# Patient Record
Sex: Female | Born: 1970
Health system: Southern US, Community
[De-identification: ages and names within clinical notes are randomized; demographics above are authoritative.]

## PROBLEM LIST (undated history)

## (undated) DIAGNOSIS — E669 Obesity, unspecified: Secondary | ICD-10-CM

## (undated) DIAGNOSIS — K219 Gastro-esophageal reflux disease without esophagitis: Secondary | ICD-10-CM

## (undated) DIAGNOSIS — G8929 Other chronic pain: Secondary | ICD-10-CM

## (undated) DIAGNOSIS — D869 Sarcoidosis, unspecified: Secondary | ICD-10-CM

## (undated) DIAGNOSIS — E079 Disorder of thyroid, unspecified: Secondary | ICD-10-CM

## (undated) DIAGNOSIS — M25561 Pain in right knee: Secondary | ICD-10-CM

## (undated) DIAGNOSIS — K8020 Calculus of gallbladder without cholecystitis without obstruction: Secondary | ICD-10-CM

## (undated) DIAGNOSIS — I10 Essential (primary) hypertension: Secondary | ICD-10-CM

## (undated) DIAGNOSIS — M25562 Pain in left knee: Secondary | ICD-10-CM

## (undated) HISTORY — PX: ABDOMINAL HYSTERECTOMY: SHX81

## (undated) HISTORY — PX: OTHER SURGICAL HISTORY: SHX169

## (undated) HISTORY — PX: KNEE SURGERY: SHX244

---

## 2011-05-09 ENCOUNTER — Encounter: Payer: Self-pay | Admitting: Family Medicine

## 2011-05-09 ENCOUNTER — Ambulatory Visit (INDEPENDENT_AMBULATORY_CARE_PROVIDER_SITE_OTHER): Payer: 59 | Admitting: Family Medicine

## 2011-05-09 VITALS — BP 184/92 | HR 87 | Temp 98.1°F | Ht 65.0 in | Wt 268.2 lb

## 2011-05-09 DIAGNOSIS — M25569 Pain in unspecified knee: Secondary | ICD-10-CM

## 2011-05-09 DIAGNOSIS — M25562 Pain in left knee: Secondary | ICD-10-CM

## 2011-05-09 MED ORDER — MELOXICAM 15 MG PO TABS: 15.0000 mg | ORAL_TABLET | Freq: Every day | ORAL | Status: AC

## 2011-05-09 NOTE — Patient Instructions (Signed)
While you do appear to have a baker's cyst deep in your knee, this is rarely a cause of pain (more the result of something else going on in the knee). Take mobic 15mg  daily with food for pain and inflammation. Ice your knee 15 minutes at a time 3-4 times a day. ACE wrap to help put pressure on this and help with swelling. Elevate above the level of your heart as much as possible. Start physical therapy 1-2 times a week for next 3 weeks. See me back in 3 weeks to recheck on how you're doing. We can consider further imaging, injection at that time if you're not improving.

## 2011-05-12 ENCOUNTER — Encounter: Payer: Self-pay | Admitting: Family Medicine

## 2011-05-12 DIAGNOSIS — M179 Osteoarthritis of knee, unspecified: Secondary | ICD-10-CM | POA: Insufficient documentation

## 2011-05-12 DIAGNOSIS — M25562 Pain in left knee: Secondary | ICD-10-CM | POA: Insufficient documentation

## 2011-05-12 NOTE — Assessment & Plan Note (Signed)
Pain, history, and exam not consistent with intraarticular pathology.  Doppler negative for DVT which is reassuring.  Differential includes popliteal spasm, baker's cyst causing stiffness and pain, hamstring/calf tendinopathy.  Did not proceed with x-rays today as they would not change management at this time but if we pursue intraarticular injection (can help with pain from bakers cysts if that is source), would get those at future visit.  Pain seems most likely related to popliteus strain/spasm.  Will treat conservatively - icing, mobic, ace wrap, elevation.  Start PT and will reevaluate in 3 weeks.

## 2011-05-12 NOTE — Progress Notes (Signed)
  Subjective:    Patient ID: Elizabeth Riggs, female    DOB: 06-Jul-1971, 40 y.o.   MRN: [id number]  HPI  40 yo F here for left knee pain  Patient reports no known injury. States began having pain behind left knee about 2 weeks ago. Felt like she had a cramp in her leg behind knee that has not gone away. Hard to put pressure on this leg 2/2 pain here. Has not increased activity level prior to pain. Pain is worse with bending and prolonged standing. No prior left knee injuries or surgeries. Feels stiff. Went to [facility] - had doppler u/s that was negative for DVT (were able to confirm by obtaining records).  Did note there were 2 possible baker's cysts in this knee though. Given prescription for percocet.  History reviewed. No pertinent past medical history.  No current outpatient prescriptions on file prior to visit.    Past Surgical History  Procedure Date  . Right knee surgery     No Known Allergies  History   Social History  . Marital Status: Single    Spouse Name: N/A    Number of Children: N/A  . Years of Education: N/A   Occupational History  . Not on file.   Social History Main Topics  . Smoking status: Never Smoker   . Smokeless tobacco: Not on file  . Alcohol Use: Not on file  . Drug Use: Not on file  . Sexually Active: Not on file   Other Topics Concern  . Not on file   Social History Narrative  . No narrative on file    Family History  Problem Relation Age of Onset  . Hypertension Mother   . Diabetes Father   . Hypertension Father   . Heart attack Neg Hx     BP 184/92  Pulse 87  Temp(Src) 98.1 F (36.7 C) (Oral)  Ht 5\' 5"  (1.651 m)  Wt 268 lb 3.2 oz (121.655 kg)  BMI 44.63 kg/m2  Review of Systems See HPI above.    Objective:   Physical Exam Gen: NAD L knee: No gross deformity, effusion, bruising. No joint line or posterior patellar TTP.  No pes TTP.  No palpable cords in calf, thigh, or behind knee.  No swelling,  erythema or warmth here either.  Pain with very deep palpation popliteal fossa.  No TTP hamstring or calf tendons. FROM. Stable to valgus/varus stress.  Negative ant/post drawers.  Negative lachmanns Negative mcmurrays, apprehension, clarkes NVI distally.    MSK u/s: with 4 cm depth on ultrasound, able to see two hypoechoic areas very deep on this posterior left knee.  Deep to vasculature.  Assessment & Plan:  1. Left knee pain - Pain, history, and exam not consistent with intraarticular pathology.  Doppler negative for DVT which is reassuring.  Differential includes popliteal spasm, baker's cyst causing stiffness and pain, hamstring/calf tendinopathy.  Did not proceed with x-rays today as they would not change management at this time but if we pursue intraarticular injection (can help with pain from bakers cysts if that is source), would get those at future visit.  Pain seems most likely related to popliteus strain/spasm.  Will treat conservatively - icing, mobic, ace wrap, elevation.  Start PT and will reevaluate in 3 weeks.

## 2011-05-16 ENCOUNTER — Ambulatory Visit: Payer: 59 | Attending: Family Medicine | Admitting: Physical Therapy

## 2011-05-16 DIAGNOSIS — M25669 Stiffness of unspecified knee, not elsewhere classified: Secondary | ICD-10-CM | POA: Insufficient documentation

## 2011-05-16 DIAGNOSIS — IMO0001 Reserved for inherently not codable concepts without codable children: Secondary | ICD-10-CM | POA: Insufficient documentation

## 2011-05-16 DIAGNOSIS — M25569 Pain in unspecified knee: Secondary | ICD-10-CM | POA: Insufficient documentation

## 2011-05-18 ENCOUNTER — Ambulatory Visit: Payer: 59 | Admitting: Physical Therapy

## 2011-05-20 ENCOUNTER — Encounter: Payer: Self-pay | Admitting: Family Medicine

## 2011-05-25 ENCOUNTER — Ambulatory Visit: Payer: 59 | Admitting: Rehabilitation

## 2011-05-30 ENCOUNTER — Encounter: Payer: Self-pay | Admitting: Family Medicine

## 2011-05-30 ENCOUNTER — Ambulatory Visit (HOSPITAL_BASED_OUTPATIENT_CLINIC_OR_DEPARTMENT_OTHER)
Admission: RE | Admit: 2011-05-30 | Discharge: 2011-05-30 | Disposition: A | Discharge status: Home / Self Care | Payer: 59 | Source: Ambulatory Visit | Attending: Family Medicine | Admitting: Family Medicine

## 2011-05-30 ENCOUNTER — Ambulatory Visit (INDEPENDENT_AMBULATORY_CARE_PROVIDER_SITE_OTHER): Payer: 59 | Admitting: Family Medicine

## 2011-05-30 VITALS — BP 162/97 | HR 90 | Temp 97.6°F | Ht 65.0 in | Wt 250.0 lb

## 2011-05-30 DIAGNOSIS — R609 Edema, unspecified: Secondary | ICD-10-CM | POA: Insufficient documentation

## 2011-05-30 DIAGNOSIS — M25562 Pain in left knee: Secondary | ICD-10-CM

## 2011-05-30 DIAGNOSIS — M25569 Pain in unspecified knee: Secondary | ICD-10-CM

## 2011-05-30 DIAGNOSIS — M1710 Unilateral primary osteoarthritis, unspecified knee: Secondary | ICD-10-CM | POA: Insufficient documentation

## 2011-05-30 DIAGNOSIS — IMO0002 Reserved for concepts with insufficient information to code with codable children: Secondary | ICD-10-CM | POA: Insufficient documentation

## 2011-05-30 NOTE — Progress Notes (Signed)
Subjective:    Patient ID: Elizabeth Riggs, female    DOB: 12/11/1971, 40 y.o.   MRN: [id number]  HPI   40 yo F here for 3 week f/u left knee pain  Last OV: Patient reports no known injury. States began having pain behind left knee about 2 weeks ago. Felt like she had a cramp in her leg behind knee that has not gone away. Hard to put pressure on this leg 2/2 pain here. Has not increased activity level prior to pain. Pain is worse with bending and prolonged standing. No prior left knee injuries or surgeries. Feels stiff. Went to [facility] ED - had doppler u/s that was negative for DVT (were able to confirm by obtaining records).  Did note there were 2 possible baker's cysts in this knee though. Given prescription for percocet.  Today: Patient has been going to PT for popliteal strain/spasm and states helps some while there but massage hurts a lot when they do this Mobic helps only for about an hour Knee swelling and now having medial left knee pain as well. Going to start 12 hour shifts at work - pain worse with prolonged standing, walking Walking with a limp. Still has stiffness within knee as well. No true catching or locking.  History reviewed. No pertinent past medical history.  Current Outpatient Prescriptions on File Prior to Visit  Medication Sig Dispense Refill  . meloxicam (MOBIC) 15 MG tablet Take 1 tablet (15 mg total) by mouth daily. With food.  30 tablet  1    Past Surgical History  Procedure Date  . Right knee surgery     No Known Allergies  History   Social History  . Marital Status: Single    Spouse Name: N/A    Number of Children: N/A  . Years of Education: N/A   Occupational History  . Not on file.   Social History Main Topics  . Smoking status: Never Smoker   . Smokeless tobacco: Not on file  . Alcohol Use: Not on file  . Drug Use: Not on file  . Sexually Active: Not on file   Other Topics Concern  . Not on file   Social  History Narrative  . No narrative on file    Family History  Problem Relation Age of Onset  . Hypertension Mother   . Diabetes Father   . Hypertension Father   . Heart attack Neg Hx     BP 162/97  Pulse 90  Temp(Src) 97.6 F (36.4 C) (Oral)  Ht 5\' 5"  (1.651 m)  Wt 250 lb (113.399 kg)  BMI 41.60 kg/m2  Review of Systems  See HPI above.    Objective:   Physical Exam  Gen: NAD L knee: No gross deformity, effusion, bruising. Mild-mod medial joint line TTP.  No lateral TTP.  TTP deep in popliteal fossa.  No pes TTP.  No palpable cords in calf, thigh, or behind knee.  No swelling, erythema or warmth here either.  No TTP hamstring or calf tendons. FROM. Stable to valgus/varus stress.  Negative ant/post drawers.  Negative lachmanns Negative mcmurrays, apprehension, clarkes NVI distally.     Assessment & Plan:  1. Left knee pain - X-rays performed and reviewed - has moderate medial DJD, advanced for her age.  Tenderness in this area, lack of an injury, baker's cysts and intermittent swelling all consistent with this diagnosis.  Will continue with PT and given cortisone injection today.  Advised her to f/u with  me in 2 weeks.  If not improving still, would then get MRI to rule out meniscal tear or other pathology.  Continue icing, mobic, elevation.  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  After informed written consent, patient was seated on exam table and left knee was prepped with alcohol swab.  Utilizing anterolateral approach, left knee was injected intraarticularly with 3:1 marcaine: depomedrol.  Patient tolerated procedure well without any immediate complications.

## 2011-05-30 NOTE — Assessment & Plan Note (Signed)
X-rays performed and reviewed - has moderate medial DJD, advanced for her age.  Tenderness in this area, lack of an injury, baker's cysts and intermittent swelling all consistent with this diagnosis.  Will continue with PT and given cortisone injection today.  Advised her to f/u with me in 2 weeks.  If not improving still, would then get MRI to rule out meniscal tear or other pathology.  Continue icing, mobic, elevation.  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  After informed written consent, patient was seated on exam table and left knee was prepped with alcohol swab.  Utilizing anterolateral approach, left knee was injected intraarticularly with 3:1 marcaine: depomedrol.  Patient tolerated procedure well without any immediate complications.

## 2011-05-31 ENCOUNTER — Encounter: Payer: 59 | Admitting: Physical Therapy

## 2011-06-02 ENCOUNTER — Ambulatory Visit: Payer: 59 | Admitting: Physical Therapy

## 2011-06-13 ENCOUNTER — Encounter: Payer: Self-pay | Admitting: Family Medicine

## 2011-06-13 ENCOUNTER — Ambulatory Visit (INDEPENDENT_AMBULATORY_CARE_PROVIDER_SITE_OTHER): Payer: 59 | Admitting: Family Medicine

## 2011-06-13 VITALS — BP 127/85 | HR 80 | Temp 97.4°F | Ht 65.0 in | Wt 235.0 lb

## 2011-06-13 DIAGNOSIS — M25569 Pain in unspecified knee: Secondary | ICD-10-CM

## 2011-06-13 DIAGNOSIS — M25562 Pain in left knee: Secondary | ICD-10-CM

## 2011-06-13 NOTE — Progress Notes (Signed)
Subjective:    Patient ID: Elizabeth Riggs, female    DOB: November 18, 1971, 40 y.o.   MRN: [id number]  HPI   40 yo F here for 40 week f/u left knee pain  Initial OV: Patient reports no known injury. States began having pain behind left knee about 2 weeks ago. Felt like she had a cramp in her leg behind knee that has not gone away. Hard to put pressure on this leg 2/2 pain here. Has not increased activity level prior to pain. Pain is worse with bending and prolonged standing. No prior left knee injuries or surgeries. Feels stiff. Went to [facility] ED - had doppler u/s that was negative for DVT (were able to confirm by obtaining records).  Did note there were 2 possible baker's cysts in this knee though. Given prescription for percocet.  2 weeks ago: Patient has been going to PT for popliteal strain/spasm and states helps some while there but massage hurts a lot when they do this Mobic helps only for about an hour Knee swelling and now having medial left knee pain as well. Going to start 12 hour shifts at work - pain worse with prolonged standing, walking Walking with a limp. Still has stiffness within knee as well. No true catching or locking.  Today: Patient is significantly improved after cortisone injection, no pain currently. 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 No longer icing or taking mobic. Finished with PT. Ready to go back to work full time.  History reviewed. No pertinent past medical history.  Current Outpatient Prescriptions on File Prior to Visit  Medication Sig Dispense Refill  . meloxicam (MOBIC) 15 MG tablet Take 1 tablet (15 mg total) by mouth daily. With food.  30 tablet  1    Past Surgical History  Procedure Date  . Right knee surgery     No Known Allergies  History   Social History  . Marital Status: Single    Spouse Name: N/A    Number of Children: N/A  . Years of  Education: N/A   Occupational History  . Not on file.   Social History Main Topics  . Smoking status: Never Smoker   . Smokeless tobacco: Not on file  . Alcohol Use: Not on file  . Drug Use: Not on file  . Sexually Active: Not on file   Other Topics Concern  . Not on file   Social History Narrative  . No narrative on file    Family History  Problem Relation Age of Onset  . Hypertension Mother   . Diabetes Father   . Hypertension Father   . Heart attack Neg Hx     BP 127/85  Pulse 80  Temp(Src) 97.4 F (36.3 C) (Oral)  Ht 5\' 5"  (1.651 m)  Wt 235 lb (106.595 kg)  BMI 39.11 kg/m2  Review of Systems  See HPI above.    Objective:   Physical Exam  Gen: NAD L knee: No gross deformity, effusion, bruising. No joint line TTP.  No lateral TTP.  No posterior TTP.  No pes TTP.  No palpable cords in calf, thigh, or behind knee.  No swelling, erythema or warmth here either.  No TTP hamstring or calf tendons. FROM. Stable to valgus/varus stress.  Negative ant/post drawers.  Negative lachmanns Negative mcmurrays, apprehension, clarkes NVI distally.     Assessment & Plan:  1. Left knee pain - 2/2 DJD.  Significant improvement with cortisone injection.  Discussed can get these every 3 months if needed.  Ice, aleve OR mobic as needed.  Return to work full duty.  Continue home exercises.  F/u prn.

## 2011-06-13 NOTE — Assessment & Plan Note (Signed)
2/2 DJD.  Significant improvement with cortisone injection.  Discussed can get these every 3 months if needed.  Ice, aleve OR mobic as needed.  Return to work full duty.  Continue home exercises.  F/u prn.

## 2011-07-11 ENCOUNTER — Ambulatory Visit: Payer: 59 | Admitting: Family Medicine

## 2011-07-14 ENCOUNTER — Ambulatory Visit: Payer: 59 | Admitting: Family Medicine

## 2011-07-28 ENCOUNTER — Ambulatory Visit (INDEPENDENT_AMBULATORY_CARE_PROVIDER_SITE_OTHER): Payer: 59 | Admitting: Family Medicine

## 2011-07-28 ENCOUNTER — Encounter: Payer: Self-pay | Admitting: Family Medicine

## 2011-07-28 VITALS — BP 137/92 | HR 85 | Temp 98.3°F | Ht 65.0 in | Wt 240.0 lb

## 2011-07-28 DIAGNOSIS — M25569 Pain in unspecified knee: Secondary | ICD-10-CM

## 2011-07-28 DIAGNOSIS — M25562 Pain in left knee: Secondary | ICD-10-CM

## 2011-07-28 NOTE — Assessment & Plan Note (Signed)
2/2 DJD, less likely degen meniscal tear.  Significant improvement with prior cortisone injection but pain has recurred.  Given another cortisone injection today.  Advised if this one does not help with her pain more than 3 months, I would urge her to get MRI of this knee.  She does have moderate medial DJD however and this is most likely the cause of her pain.  Continue icing, add nsaids as needed.  She has already completed a course of PT/home exercises.  F/u prn.  Timeout performed.  After informed written consent, patient was seated on exam table. Left knee was prepped with alcohol swab and utilizing anterolateral approach, patient's left knee was injected intraarticularly with 3:1 marcaine: depomedrol. Patient tolerated the procedure well without immediate complications.

## 2011-07-28 NOTE — Progress Notes (Signed)
Subjective:    Patient ID: Elizabeth Riggs, female    DOB: 19-Aug-1971, 40 y.o.   MRN: [id number]  HPI  40 yo F here for f/u left knee pain  5/14: Patient reports no known injury. States began having pain behind left knee about 2 weeks ago. Felt like she had a cramp in her leg behind knee that has not gone away. Hard to put pressure on this leg 2/2 pain here. Has not increased activity level prior to pain. Pain is worse with bending and prolonged standing. No prior left knee injuries or surgeries. Feels stiff. Went to [facility] ED - had doppler u/s that was negative for DVT (were able to confirm by obtaining records).  Did note there were 2 possible baker's cysts in this knee though. Given prescription for percocet.  6/4: Patient has been going to PT for popliteal strain/spasm and states helps some while there but massage hurts a lot when they do this Mobic helps only for about an hour Knee swelling and now having medial left knee pain as well. Going to start 12 hour shifts at work - pain worse with prolonged standing, walking Walking with a limp. Still has stiffness within knee as well. No true catching or locking.  6/18: Patient is significantly improved after cortisone injection, no pain currently. 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 No longer icing or taking mobic. Finished with PT. Ready to go back to work full time.  8/2: Patient did extremely well until about 2 weeks ago when pain recurred. Pain up to an 8/10 and worse when sitting for prolonged period then goes to get up. Pain anterior and medial with associated swelling. No mechanical symptoms. Is icing Not taking any aleve, mobic, or other medicines  History reviewed. No pertinent past medical history.  Current Outpatient Prescriptions on File Prior to Visit  Medication Sig Dispense Refill  . meloxicam (MOBIC) 15 MG tablet Take 1  tablet (15 mg total) by mouth daily. With food.  30 tablet  1    Past Surgical History  Procedure Date  . Right knee surgery     No Known Allergies  History   Social History  . Marital Status: Single    Spouse Name: N/A    Number of Children: N/A  . Years of Education: N/A   Occupational History  . Not on file.   Social History Main Topics  . Smoking status: Never Smoker   . Smokeless tobacco: Not on file  . Alcohol Use: Not on file  . Drug Use: Not on file  . Sexually Active: Not on file   Other Topics Concern  . Not on file   Social History Narrative  . No narrative on file    Family History  Problem Relation Age of Onset  . Hypertension Mother   . Diabetes Father   . Hypertension Father   . Heart attack Neg Hx     BP 137/92  Pulse 85  Temp(Src) 98.3 F (36.8 C) (Oral)  Ht 5\' 5"  (1.651 m)  Wt 240 lb (108.863 kg)  BMI 39.94 kg/m2  Review of Systems  See HPI above.    Objective:   Physical Exam  Gen: NAD L knee: No gross deformity, effusion, bruising. Mod medial joint line TTP.  No lateral joint line, patellar tendon, post pat facet, pes, or other TTP about knee. FROM.  Stable to valgus/varus stress.  Negative ant/post drawers.  Negative lachmanns Negative mcmurrays, apprehension, clarkes NVI distally.     Assessment & Plan:  1. Left knee pain - 2/2 DJD, less likely degen meniscal tear.  Significant improvement with prior cortisone injection but pain has recurred.  Given another cortisone injection today.  Advised if this one does not help with her pain more than 3 months, I would urge her to get MRI of this knee.  She does have moderate medial DJD however and this is most likely the cause of her pain.  Continue icing, add nsaids as needed.  She has already completed a course of PT/home exercises.  F/u prn.  Timeout performed.  After informed written consent, patient was seated on exam table. Left knee was prepped with alcohol swab and utilizing  anterolateral approach, patient's left knee was injected intraarticularly with 3:1 marcaine: depomedrol. Patient tolerated the procedure well without immediate complications.

## 2011-08-26 ENCOUNTER — Ambulatory Visit: Payer: 59 | Admitting: Family Medicine

## 2011-09-23 ENCOUNTER — Ambulatory Visit: Payer: 59 | Admitting: Family Medicine

## 2011-09-26 ENCOUNTER — Ambulatory Visit (INDEPENDENT_AMBULATORY_CARE_PROVIDER_SITE_OTHER): Payer: 59 | Admitting: Family Medicine

## 2011-09-26 ENCOUNTER — Encounter: Payer: Self-pay | Admitting: Family Medicine

## 2011-09-26 VITALS — BP 146/92 | HR 80 | Temp 97.5°F | Ht 65.0 in | Wt 250.0 lb

## 2011-09-26 DIAGNOSIS — M25569 Pain in unspecified knee: Secondary | ICD-10-CM

## 2011-09-26 DIAGNOSIS — M25562 Pain in left knee: Secondary | ICD-10-CM

## 2011-09-26 NOTE — Patient Instructions (Signed)
The next step will be to go ahead with an MRI of your knee to assess for a medial meniscal tear since you aren't getting better as we would expect up to this point. I will call you with the test results after I receive them and how we are to proceed (the gel shot vs referral to consider having your knee scoped if you do have a meniscus tear).

## 2011-09-27 ENCOUNTER — Ambulatory Visit (HOSPITAL_BASED_OUTPATIENT_CLINIC_OR_DEPARTMENT_OTHER)
Admission: RE | Admit: 2011-09-27 | Discharge: 2011-09-27 | Disposition: A | Discharge status: Home / Self Care | Payer: 59 | Source: Ambulatory Visit | Attending: Family Medicine | Admitting: Family Medicine

## 2011-09-27 ENCOUNTER — Encounter: Payer: Self-pay | Admitting: Family Medicine

## 2011-09-27 ENCOUNTER — Other Ambulatory Visit (HOSPITAL_BASED_OUTPATIENT_CLINIC_OR_DEPARTMENT_OTHER): Payer: 59

## 2011-09-27 DIAGNOSIS — M25562 Pain in left knee: Secondary | ICD-10-CM

## 2011-09-27 DIAGNOSIS — M25469 Effusion, unspecified knee: Secondary | ICD-10-CM | POA: Insufficient documentation

## 2011-09-27 DIAGNOSIS — M25569 Pain in unspecified knee: Secondary | ICD-10-CM | POA: Insufficient documentation

## 2011-09-27 DIAGNOSIS — M23329 Other meniscus derangements, posterior horn of medial meniscus, unspecified knee: Secondary | ICD-10-CM | POA: Insufficient documentation

## 2011-09-27 DIAGNOSIS — M948X9 Other specified disorders of cartilage, unspecified sites: Secondary | ICD-10-CM

## 2011-09-27 NOTE — Progress Notes (Addendum)
Subjective:    Patient ID: Elizabeth Riggs, female    DOB: 03-25-1971, 40 y.o.   MRN: [id number]  Leg Pain    40 yo F here for f/u left knee pain  5/14: Patient reports no known injury. States began having pain behind left knee about 2 weeks ago. Felt like she had a cramp in her leg behind knee that has not gone away. Hard to put pressure on this leg 2/2 pain here. Has not increased activity level prior to pain. Pain is worse with bending and prolonged standing. No prior left knee injuries or surgeries. Feels stiff. Went to [facility] ED - had doppler u/s that was negative for DVT (were able to confirm by obtaining records).  Did note there were 2 possible baker's cysts in this knee though. Given prescription for percocet.  6/4: Patient has been going to PT for popliteal strain/spasm and states helps some while there but massage hurts a lot when they do this Mobic helps only for about an hour Knee swelling and now having medial left knee pain as well. Going to start 12 hour shifts at work - pain worse with prolonged standing, walking Walking with a limp. Still has stiffness within knee as well. No true catching or locking.  6/18: Patient is significantly improved after cortisone injection, no pain currently. 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 No longer icing or taking mobic. Finished with PT. Ready to go back to work full time.  8/2: Patient did extremely well until about 2 weeks ago when pain recurred. Pain up to an 8/10 and worse when sitting for prolonged period then goes to get up. Pain anterior and medial with associated swelling. No mechanical symptoms. Is icing Not taking any aleve, mobic, or other medicines  10/1: Patient reports last injection only helped for about a week. Pain is about the same - anteromedial with swelling. No catching, locking. Feels unstable at times. Taking  ibuprofen - has tried aleve, mobic in past. Had two cortisone injections (first lasted for about 2 months). Also did PT.  History reviewed. No pertinent past medical history.  Current Outpatient Prescriptions on File Prior to Visit  Medication Sig Dispense Refill  . meloxicam (MOBIC) 15 MG tablet Take 1 tablet (15 mg total) by mouth daily. With food.  30 tablet  1    Past Surgical History  Procedure Date  . Right knee surgery     Allergies  Allergen Reactions  . Percocet (Oxycodone-Acetaminophen)     History   Social History  . Marital Status: Single    Spouse Name: N/A    Number of Children: N/A  . Years of Education: N/A   Occupational History  . Not on file.   Social History Main Topics  . Smoking status: Never Smoker   . Smokeless tobacco: Not on file  . Alcohol Use: Not on file  . Drug Use: Not on file  . Sexually Active: Not on file   Other Topics Concern  . Not on file   Social History Narrative  . No narrative on file    Family History  Problem Relation Age of Onset  . Hypertension Mother   . Diabetes Father   . Hypertension Father   . Heart attack Neg Hx     BP 146/92  Pulse 80  Temp(Src) 97.5 F (36.4 C) (Oral)  Ht 5'  5" (1.651 m)  Wt 250 lb (113.399 kg)  BMI 41.60 kg/m2  Review of Systems  See HPI above.    Objective:   Physical Exam  Gen: NAD L knee: No gross deformity, effusion, bruising. Mod medial joint line TTP.  No lateral joint line, patellar tendon, post pat facet, pes, or other TTP about knee. FROM. Stable to valgus/varus stress.  Negative ant/post drawers.  Negative lachmanns Pain medially with mcmurrays.  Negative apprehension, clarkes NVI distally.     Assessment & Plan:  1. Left knee pain - 2/2 DJD vs degen medial meniscal tear.  Would expect longer lasting improvement with cortisone injections if this was simply DJD.  Will move forward with MRI of knee to further assess for possible meniscal tear.  Consider  referral for arthroscopy vs viscosupplementation depending on those results.  Continue icing, ibuprofen.  Continue home exercises.    Addendum:  Patient's MRI reviewed and discussed with patient.  She does have a medial meniscal tear but also DJD in medial compartment.  She has not improved as much as expected with conservative care if this was only DJD.  Will refer to orthopedic surgeon for consideration of knee arthroscopy but stressed cautious optimism if they go ahead with arthroscopy because it's not possible at this point to completely discern to what extent her pain is from DJD vs the meniscal tear.

## 2011-09-27 NOTE — Assessment & Plan Note (Signed)
2/2 DJD vs degen medial meniscal tear.  Would expect longer lasting improvement with cortisone injections if this was simply DJD.  Will move forward with MRI of knee to further assess for possible meniscal tear.  Consider referral for arthroscopy vs viscosupplementation depending on those results.  Continue icing, ibuprofen.  Continue home exercises.

## 2011-09-28 NOTE — Progress Notes (Signed)
Addended by: Lenda Kelp on: 09/28/2011 09:11 AM   Modules accepted: Orders

## 2011-10-14 ENCOUNTER — Ambulatory Visit (HOSPITAL_BASED_OUTPATIENT_CLINIC_OR_DEPARTMENT_OTHER)
Admission: RE | Admit: 2011-10-14 | Discharge: 2011-10-14 | Disposition: A | Discharge status: Home / Self Care | Payer: 59 | Source: Ambulatory Visit | Attending: Orthopedic Surgery | Admitting: Orthopedic Surgery

## 2011-10-14 DIAGNOSIS — M23329 Other meniscus derangements, posterior horn of medial meniscus, unspecified knee: Secondary | ICD-10-CM | POA: Insufficient documentation

## 2011-10-14 DIAGNOSIS — Z01812 Encounter for preprocedural laboratory examination: Secondary | ICD-10-CM | POA: Insufficient documentation

## 2011-10-14 LAB — POCT HEMOGLOBIN-HEMACUE: Hemoglobin: 12.6 g/dL (ref 12.0–15.0)

## 2011-10-17 NOTE — Op Note (Signed)
  NAMEWINDA, Elizabeth NO.:  [id number]  MEDICAL RECORD NO.:  [id number]  LOCATION:                                 FACILITY:  PHYSICIAN:  Eulas Post, MD    DATE OF BIRTH:  02-20-1971  DATE OF PROCEDURE:  10/14/2011 DATE OF DISCHARGE:                              OPERATIVE REPORT   ATTENDING SURGEON:  Eulas Post, MD  PREOPERATIVE DIAGNOSIS:  Right knee posterior horn medial meniscus tear.  POSTOPERATIVE DIAGNOSE:  Right knee posterior horn medial meniscus tear.  OPERATIVE PROCEDURE:  Right knee arthroscopy with partial medial meniscectomy.  ANESTHESIA:  General.  ESTIMATED BLOOD LOSS:  Minimal.  OPERATIVE FINDINGS:  There was some slight cracking on the undersurface of the patella.  The patellofemoral joint was otherwise normal.  The medial and lateral gutters, lateral compartment, ACL and PCL were all normal.  The medial compartment had grade 1 chondral changes.  The medial meniscus had a small radial tear in the posterior horn.  The rest of the meniscus was intact.  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  She elected for surgical management.  The risks, benefits, and alternatives were discussed before the procedure including but not limited to risks of infection, bleeding, nerve injury, recurrent meniscal tear, incomplete relief of pain, stiffness, loss of function, cardiopulmonary complications, among others and she is willing to proceed.  OPERATIVE PROCEDURE:  The patient was brought to the operating room and placed in supine position.  General anesthesia was administered.  The right lower extremity is prepped and draped in usual sterile fashion. Time-out was performed.  Diagnostic arthroscopy was carried out with the above-named findings.  I used 2 inferior portals.  The arthroscopic shaver and the arthroscopic biter was used to debride back the  posterior horn of the medial meniscus.  This was debrided back to a stable rim. Access to the very posterior aspect of this was somewhat challenging secondary to her body habitus, but we were able to get access and to debride the tear back.  Stable configuration was achieved.  I irrigated the knee copiously and repaired the portals with Monocryl followed by Steri-Strips with sterile gauze. She was awakened and returned to the PACU in stable and satisfactory condition.  There no complications.  She tolerated procedure well.     Eulas Post, MD     JPL/MEDQ  D:  10/14/2011  T:  10/14/2011  Job:  [id number]  Electronically Signed by Teryl Lucy MD on 10/17/2011 04:22:29 PM

## 2012-10-08 ENCOUNTER — Encounter (HOSPITAL_BASED_OUTPATIENT_CLINIC_OR_DEPARTMENT_OTHER): Payer: Self-pay | Admitting: Emergency Medicine

## 2012-10-08 ENCOUNTER — Emergency Department (HOSPITAL_BASED_OUTPATIENT_CLINIC_OR_DEPARTMENT_OTHER)
Admission: EM | Admit: 2012-10-08 | Discharge: 2012-10-08 | Disposition: A | Discharge status: Home / Self Care | Payer: Self-pay | Attending: Emergency Medicine | Admitting: Emergency Medicine

## 2012-10-08 DIAGNOSIS — Z833 Family history of diabetes mellitus: Secondary | ICD-10-CM | POA: Insufficient documentation

## 2012-10-08 DIAGNOSIS — T148 Other injury of unspecified body region: Secondary | ICD-10-CM | POA: Insufficient documentation

## 2012-10-08 DIAGNOSIS — W57XXXA Bitten or stung by nonvenomous insect and other nonvenomous arthropods, initial encounter: Secondary | ICD-10-CM | POA: Insufficient documentation

## 2012-10-08 DIAGNOSIS — I10 Essential (primary) hypertension: Secondary | ICD-10-CM | POA: Insufficient documentation

## 2012-10-08 DIAGNOSIS — Z8249 Family history of ischemic heart disease and other diseases of the circulatory system: Secondary | ICD-10-CM | POA: Insufficient documentation

## 2012-10-08 HISTORY — DX: Essential (primary) hypertension: I10

## 2012-10-08 HISTORY — DX: Disorder of thyroid, unspecified: E07.9

## 2012-10-08 MED ORDER — HYDROXYZINE HCL 25 MG PO TABS
25.0000 mg | ORAL_TABLET | Freq: Once | ORAL | Status: AC
  Administered 2012-10-08: 25 mg via ORAL
  Filled 2012-10-08: qty 1

## 2012-10-08 MED ORDER — HYDROXYZINE HCL 25 MG PO TABS: 25.0000 mg | ORAL_TABLET | Freq: Four times a day (QID) | ORAL | Status: DC

## 2012-10-08 MED ORDER — HYDROCORTISONE 1 % EX CREA: TOPICAL_CREAM | CUTANEOUS | Status: DC

## 2012-10-08 NOTE — ED Notes (Signed)
Rash over body,red and swollen areas

## 2012-10-08 NOTE — ED Notes (Signed)
States slept in a hotel room this weekend and on Sunday woke up with multiple red itching areas on her body.  Boyfriend has same.

## 2012-10-08 NOTE — ED Provider Notes (Signed)
History     CSN: [id number]  Arrival date & time 10/08/12  4098   First MD Initiated Contact with Patient 10/08/12 1912      Chief Complaint  Patient presents with  . Rash    (Consider location/radiation/quality/duration/timing/severity/associated sxs/prior treatment) Patient is a 41 y.o. female presenting with rash. The history is provided by the patient.  Rash  This is a new problem. There has been no fever. Associated symptoms comments: She stayed at a motel for two nights and subsequently developed a rash consisting of multiple and generalized single bumps that itch. .    Past Medical History  Diagnosis Date  . Thyroid disease   . Hypertension     Past Surgical History  Procedure Date  . Right knee surgery     Family History  Problem Relation Age of Onset  . Hypertension Mother   . Diabetes Father   . Hypertension Father   . Heart attack Neg Hx     History  Substance Use Topics  . Smoking status: Never Smoker   . Smokeless tobacco: Not on file  . Alcohol Use: Not on file    OB History    Grav Para Term Preterm Abortions TAB SAB Ect Mult Living                  Review of Systems  Constitutional: Negative for fever and chills.  Respiratory: Negative.   Cardiovascular: Negative.   Skin: Positive for rash.  Neurological: Negative.     Allergies  Percocet  Home Medications   Current Outpatient Rx  Name Route Sig Dispense Refill  . HYDROCHLOROTHIAZIDE 25 MG PO TABS Oral Take 25 mg by mouth daily.    Marland Kitchen LEVOTHYROXINE SODIUM 25 MCG PO TABS Oral Take 25 mcg by mouth daily.      BP 121/82  Pulse 76  Temp 98.1 F (36.7 C) (Oral)  Resp 18  Ht 5\' 5"  (1.651 m)  Wt 235 lb (106.595 kg)  BMI 39.11 kg/m2  SpO2 100%  Physical Exam  Constitutional: She is oriented to person, place, and time. She appears well-developed and well-nourished.  Neck: Normal range of motion.  Pulmonary/Chest: Effort normal.  Neurological: She is alert and oriented to  person, place, and time.  Skin: Skin is warm and dry.       Singular raised lesions c/w insect bites in generalized distribution.     ED Course  Procedures (including critical care time)  Labs Reviewed - No data to display No results found.   No diagnosis found.  1. Insect bites.  MDM  Query bed bugs given history. Benadryl no relief. Recommend Atarax and topical cortisone.        Rodena Medin, PA-C 10/08/12 2010

## 2012-10-08 NOTE — ED Provider Notes (Signed)
Medical screening examination/treatment/procedure(s) were performed by non-physician practitioner and as supervising physician I was immediately available for consultation/collaboration.   Dorthea Maina, MD 10/08/12 2101 

## 2013-03-20 ENCOUNTER — Ambulatory Visit (INDEPENDENT_AMBULATORY_CARE_PROVIDER_SITE_OTHER): Payer: 59 | Admitting: Family Medicine

## 2013-03-20 DIAGNOSIS — M25562 Pain in left knee: Secondary | ICD-10-CM

## 2013-03-20 DIAGNOSIS — M25569 Pain in unspecified knee: Secondary | ICD-10-CM

## 2013-03-20 NOTE — Patient Instructions (Addendum)
You've likely flared up the arthritis you have in your left knee.  Less likely possibility is a new meniscus tear. Start with cortisone shot. Aleve 2 tabs twice a day with food for pain and inflammation. Icing as needed. Do home exercise program with quad and hamstring strengthening. Consider formal physical therapy. Follow up with me in about 6 weeks.  If not improving could consider repeating your MRI.

## 2013-03-21 ENCOUNTER — Encounter: Payer: Self-pay | Admitting: Family Medicine

## 2013-03-21 NOTE — Progress Notes (Signed)
Subjective:    Patient ID: Elizabeth Riggs, female    DOB: 1971-09-24, 42 y.o.   MRN: [id number]  Knee Pain   Leg Pain    42 y.o. F here for f/u left knee pain  5/14: Patient reports no known injury. States began having pain behind left knee about 2 weeks ago. Felt like she had a cramp in her leg behind knee that has not gone away. Hard to put pressure on this leg 2/2 pain here. Has not increased activity level prior to pain. Pain is worse with bending and prolonged standing. No prior left knee injuries or surgeries. Feels stiff. Went to [facility] ED - had doppler u/s that was negative for DVT (were able to confirm by obtaining records).  Did note there were 2 possible baker's cysts in this knee though. Given prescription for percocet.  6/4: Patient has been going to PT for popliteal strain/spasm and states helps some while there but massage hurts a lot when they do this Mobic helps only for about an hour Knee swelling and now having medial left knee pain as well. Going to start 12 hour shifts at work - pain worse with prolonged standing, walking Walking with a limp. Still has stiffness within knee as well. No true catching or locking.  6/18: Patient is significantly improved after cortisone injection, no pain currently. 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 No longer icing or taking mobic. Finished with PT. Ready to go back to work full time.  8/2: Patient did extremely well until about 2 weeks ago when pain recurred. Pain up to an 8/10 and worse when sitting for prolonged period then goes to get up. Pain anterior and medial with associated swelling. No mechanical symptoms. Is icing Not taking any aleve, mobic, or other medicines  09/26/11: Patient reports last injection only helped for about a week. Pain is about the same - anteromedial with swelling. No catching, locking. Feels unstable  at times. Taking ibuprofen - has tried aleve, mobic in past. Had two cortisone injections (first lasted for about 2 months). Also did PT.  03/20/13: Patient had arthroscopy of left knee with good relief of her pain. Then states has been limping more over past several weeks to months. Recalls about a week ago was worsened more when she slipped and fell in [place] but she's unable to tell me exactly how she fell down. Taking naproxen which helps some. Pain throughout left knee but mostly posterior. Not doing anything for pain including exercises, icing, medication. Works in transportation now.  Past Medical History  Diagnosis Date  . Thyroid disease   . Hypertension     Current Outpatient Prescriptions on File Prior to Visit  Medication Sig Dispense Refill  . hydrochlorothiazide (HYDRODIURIL) 25 MG tablet Take 25 mg by mouth daily.      . hydrocortisone cream 1 % Apply to affected area 2 times daily  15 g  0  . hydrOXYzine (ATARAX/VISTARIL) 25 MG tablet Take 1 tablet (25 mg total) by mouth every 6 (six) hours.  12 tablet  0  . levothyroxine (SYNTHROID, LEVOTHROID) 25 MCG tablet Take 25 mcg by mouth daily.       No current facility-administered medications on file prior to visit.    Past Surgical History  Procedure Laterality Date  . Right knee surgery      Allergies  Allergen Reactions  .  Percocet (Oxycodone-Acetaminophen)     History   Social History  . Marital Status: Single    Spouse Name: N/A    Number of Children: N/A  . Years of Education: N/A   Occupational History  . Not on file.   Social History Main Topics  . Smoking status: Never Smoker   . Smokeless tobacco: Not on file  . Alcohol Use: Not on file  . Drug Use: Not on file  . Sexually Active: Not on file   Other Topics Concern  . Not on file   Social History Narrative  . No narrative on file    Family History  Problem Relation Age of Onset  . Hypertension Mother   . Diabetes Father   .  Hypertension Father   . Heart attack Neg Hx     There were no vitals taken for this visit.  Review of Systems  See HPI above.    Objective:   Physical Exam  Gen: NAD L knee: No gross deformity, effusion, bruising. Mild medial joint line TTP.  Tenderness medial hamstring, medial gastroc.  No other lateral joint line, patellar tendon, post pat facet, pes, or other TTP about knee. FROM. Stable to valgus/varus stress.  Negative ant/post drawers.  Negative lachmanns Negative mcmurrays, apleys.  Negative apprehension, clarkes NVI distally.     Assessment & Plan:  1. Left knee pain - 2/2 mild DJD with posterior deep effusion in popliteal fossa.  New meniscal tear is possible but unlikely.  Start with cortisone shot.  Discussed conservative measures - home exercise program to restart, tylenol, nsaids, capsaicin.  F/u in 6 weeks for reevaluation.  After informed written consent, patient was seated on exam table. Left knee was prepped with alcohol swab and utilizing anteromedial approach, patient's left knee was injected intraarticularly with 3:1 marcaine: depomedrol. Patient tolerated the procedure well without immediate complications.

## 2013-03-21 NOTE — Assessment & Plan Note (Signed)
2/2 mild DJD with posterior deep effusion in popliteal fossa.  New meniscal tear is possible but unlikely.  Start with cortisone shot.  Discussed conservative measures - home exercise program to restart, tylenol, nsaids, capsaicin.  F/u in 6 weeks for reevaluation.  After informed written consent, patient was seated on exam table. Left knee was prepped with alcohol swab and utilizing anteromedial approach, patient's left knee was injected intraarticularly with 3:1 marcaine: depomedrol. Patient tolerated the procedure well without immediate complications.

## 2013-04-04 ENCOUNTER — Ambulatory Visit: Payer: 59 | Admitting: Family Medicine

## 2013-04-04 DIAGNOSIS — Z0289 Encounter for other administrative examinations: Secondary | ICD-10-CM

## 2013-05-01 ENCOUNTER — Ambulatory Visit: Payer: 59 | Admitting: Family Medicine

## 2013-07-31 ENCOUNTER — Ambulatory Visit: Payer: 59 | Admitting: Family Medicine

## 2013-08-05 ENCOUNTER — Ambulatory Visit (INDEPENDENT_AMBULATORY_CARE_PROVIDER_SITE_OTHER): Payer: 59 | Admitting: Family Medicine

## 2013-08-05 ENCOUNTER — Encounter: Payer: Self-pay | Admitting: Family Medicine

## 2013-08-05 VITALS — BP 136/80 | HR 82 | Ht 65.0 in | Wt 230.0 lb

## 2013-08-05 DIAGNOSIS — M25562 Pain in left knee: Secondary | ICD-10-CM

## 2013-08-05 DIAGNOSIS — M25569 Pain in unspecified knee: Secondary | ICD-10-CM

## 2013-08-05 MED ORDER — TRAMADOL HCL 50 MG PO TABS: 50.0000 mg | ORAL_TABLET | Freq: Three times a day (TID) | ORAL | Status: DC | PRN

## 2013-08-05 NOTE — Patient Instructions (Addendum)
You have arthritis in both of your knees. Take a combination of these medicines: Take tylenol 500mg  1-2 tabs three times a day for pain. Aleve 1-2 tabs twice a day with food Glucosamine sulfate 750mg  twice a day is a supplement for arthritis. Capsaicin topically up to four times a day may also help with pain. Tramadol as needed for severe pain (may make you constipated.  Also, don't drive or work on this medicine). Cortisone injections are an option - call me if you want to go ahead with these. If cortisone injections do not help, there are different types of shots that may help but they take longer to take effect. It's important that you continue to stay active. If you are overweight, try to lose weight through diet and exercise. Straight leg raises, knee extensions 3 sets of 10 once a day (add ankle weight if these become too easy). Consider physical therapy to strengthen muscles around the joint that hurts to take pressure off of the joint itself. Shoe inserts with good arch support may be helpful. Walker or cane if needed. Heat or ice 15 minutes at a time 3-4 times a day as needed to help with pain. Follow up with me in a month.

## 2013-08-08 ENCOUNTER — Encounter: Payer: Self-pay | Admitting: Family Medicine

## 2013-08-08 NOTE — Progress Notes (Signed)
Subjective:    Patient ID: Elizabeth Riggs, female    DOB: [birth year], 42 y.o.   MRN: [id number]  Knee Pain   Leg Pain    42 yo F here for f/u bilateral knee pain  5/14: Patient reports no known injury. States began having pain behind left knee about 2 weeks ago. Felt like she had a cramp in her leg behind knee that has not gone away. Hard to put pressure on this leg 2/2 pain here. Has not increased activity level prior to pain. Pain is worse with bending and prolonged standing. No prior left knee injuries or surgeries. Feels stiff. Went to [facility] ED - had doppler u/s that was negative for DVT (were able to confirm by obtaining records).  Did note there were 2 possible baker's cysts in this knee though. Given prescription for percocet.  6/4: Patient has been going to PT for popliteal strain/spasm and states helps some while there but massage hurts a lot when they do this Mobic helps only for about an hour Knee swelling and now having medial left knee pain as well. Going to start 12 hour shifts at work - pain worse with prolonged standing, walking Walking with a limp. Still has stiffness within knee as well. No true catching or locking.  6/18: Patient is significantly improved after cortisone injection, no pain currently. 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 No longer icing or taking mobic. Finished with PT. Ready to go back to work full time.  8/2: Patient did extremely well until about 2 weeks ago when pain recurred. Pain up to an 8/10 and worse when sitting for prolonged period then goes to get up. Pain anterior and medial with associated swelling. No mechanical symptoms. Is icing Not taking any aleve, mobic, or other medicines  09/26/11: Patient reports last injection only helped for about a week. Pain is about the same - anteromedial with swelling. No catching, locking. Feels  unstable at times. Taking ibuprofen - has tried aleve, mobic in past. Had two cortisone injections (first lasted for about 2 months). Also did PT.  03/20/13: Patient had arthroscopy of left knee with good relief of her pain. Then states has been limping more over past several weeks to months. Recalls about a week ago was worsened more when she slipped and fell in [place] but she's unable to tell me exactly how she fell down. Taking naproxen which helps some. Pain throughout left knee but mostly posterior. Not doing anything for pain including exercises, icing, medication. Works in transportation now.  8/11: Patient returns now with bilateral knee pain. Reports she's not currently taking any medicine for pain. Had been taking aleve at nighttime. Walks a lot at work. Describes aching, stiffness. Difficulty sleeping due to pain. Will wake her up in early morning. Does not want repeat injections.  Past Medical History  Diagnosis Date  . Thyroid disease   . Hypertension     Current Outpatient Prescriptions on File Prior to Visit  Medication Sig Dispense Refill  . hydrochlorothiazide (HYDRODIURIL) 25 MG tablet Take 25 mg by mouth daily.      . hydrocortisone cream 1 % Apply to affected area 2 times daily  15 g  0  . hydrOXYzine (ATARAX/VISTARIL) 25 MG tablet Take 1 tablet (25 mg total) by mouth every 6 (six) hours.  12 tablet  0  . levothyroxine (SYNTHROID,  LEVOTHROID) 25 MCG tablet Take 25 mcg by mouth daily.       No current facility-administered medications on file prior to visit.    Past Surgical History  Procedure Laterality Date  . Right knee surgery      Allergies  Allergen Reactions  . Percocet [Oxycodone-Acetaminophen]     History   Social History  . Marital Status: Single    Spouse Name: N/A    Number of Children: N/A  . Years of Education: N/A   Occupational History  . Not on file.   Social History Main Topics  . Smoking status: Never Smoker   .  Smokeless tobacco: Not on file  . Alcohol Use: Not on file  . Drug Use: Not on file  . Sexual Activity: Not on file   Other Topics Concern  . Not on file   Social History Narrative  . No narrative on file    Family History  Problem Relation Age of Onset  . Hypertension Mother   . Diabetes Father   . Hypertension Father   . Heart attack Neg Hx     BP 136/80  Pulse 82  Ht 5\' 5"  (1.651 m)  Wt 230 lb (104.327 kg)  BMI 38.27 kg/m2  Review of Systems  See HPI above.    Objective:   Physical Exam  Gen: NAD  L knee: No gross deformity, effusion, bruising. Medial joint line TTP.  Less lateral joint line tenderness.  No other TTP about knee. FROM. Stable to valgus/varus stress.  Negative ant/post drawers.  Negative lachmanns Negative mcmurrays, apleys.  Negative apprehension, clarkes NVI distally.  R knee: No gross deformity, effusion, bruising. Medial joint line TTP.  Less lateral joint line tenderness.  No other TTP about knee. FROM. Stable to valgus/varus stress.  Negative ant/post drawers.  Negative lachmanns Negative mcmurrays, apleys.  Negative apprehension, clarkes NVI distally.    Assessment & Plan:  1. Bilateral knee pain - 2/2 DJD. Possible she has a meniscal tear in right knee but arthroscopy for similar in left knee provided only a few months of relief.  Declined cortisone injections today.  Home exercise program, tylenol, nsaids, capsaicin, glucosamine, tramadol as needed.  F/u in 1 month for reevaluation.

## 2013-08-08 NOTE — Assessment & Plan Note (Signed)
2/2 DJD. Possible she has a meniscal tear in right knee but arthroscopy for similar in left knee provided only a few months of relief.  Declined cortisone injections today.  Home exercise program, tylenol, nsaids, capsaicin, glucosamine, tramadol as needed.  F/u in 1 month for reevaluation.

## 2013-09-02 ENCOUNTER — Ambulatory Visit: Payer: 59 | Admitting: Family Medicine

## 2014-05-02 ENCOUNTER — Ambulatory Visit: Payer: Self-pay | Admitting: Podiatry

## 2015-01-21 ENCOUNTER — Emergency Department (HOSPITAL_BASED_OUTPATIENT_CLINIC_OR_DEPARTMENT_OTHER)
Admission: EM | Admit: 2015-01-21 | Discharge: 2015-01-21 | Disposition: A | Discharge status: Home / Self Care | Payer: 59 | Attending: Emergency Medicine | Admitting: Emergency Medicine

## 2015-01-21 ENCOUNTER — Encounter (HOSPITAL_BASED_OUTPATIENT_CLINIC_OR_DEPARTMENT_OTHER): Payer: Self-pay

## 2015-01-21 ENCOUNTER — Emergency Department (HOSPITAL_BASED_OUTPATIENT_CLINIC_OR_DEPARTMENT_OTHER): Payer: 59

## 2015-01-21 DIAGNOSIS — R112 Nausea with vomiting, unspecified: Secondary | ICD-10-CM | POA: Diagnosis not present

## 2015-01-21 DIAGNOSIS — E876 Hypokalemia: Secondary | ICD-10-CM | POA: Insufficient documentation

## 2015-01-21 DIAGNOSIS — I10 Essential (primary) hypertension: Secondary | ICD-10-CM | POA: Diagnosis not present

## 2015-01-21 DIAGNOSIS — R1110 Vomiting, unspecified: Secondary | ICD-10-CM | POA: Diagnosis present

## 2015-01-21 DIAGNOSIS — Z8719 Personal history of other diseases of the digestive system: Secondary | ICD-10-CM | POA: Insufficient documentation

## 2015-01-21 DIAGNOSIS — E871 Hypo-osmolality and hyponatremia: Secondary | ICD-10-CM | POA: Insufficient documentation

## 2015-01-21 DIAGNOSIS — Z79899 Other long term (current) drug therapy: Secondary | ICD-10-CM | POA: Insufficient documentation

## 2015-01-21 DIAGNOSIS — Z791 Long term (current) use of non-steroidal anti-inflammatories (NSAID): Secondary | ICD-10-CM | POA: Insufficient documentation

## 2015-01-21 DIAGNOSIS — E079 Disorder of thyroid, unspecified: Secondary | ICD-10-CM | POA: Insufficient documentation

## 2015-01-21 DIAGNOSIS — R1084 Generalized abdominal pain: Secondary | ICD-10-CM | POA: Diagnosis not present

## 2015-01-21 HISTORY — DX: Calculus of gallbladder without cholecystitis without obstruction: K80.20

## 2015-01-21 LAB — COMPREHENSIVE METABOLIC PANEL
ALK PHOS: 137 U/L — AB (ref 39–117)
ALT: 21 U/L (ref 0–35)
ANION GAP: 7 (ref 5–15)
AST: 31 U/L (ref 0–37)
Albumin: 3.9 g/dL (ref 3.5–5.2)
BUN: 9 mg/dL (ref 6–23)
CALCIUM: 9.1 mg/dL (ref 8.4–10.5)
CO2: 29 mmol/L (ref 19–32)
CREATININE: 0.94 mg/dL (ref 0.50–1.10)
Chloride: 96 mmol/L (ref 96–112)
GFR, EST AFRICAN AMERICAN: 85 mL/min — AB (ref >90)
GFR, EST NON AFRICAN AMERICAN: 73 mL/min — AB (ref >90)
Glucose, Bld: 116 mg/dL — ABNORMAL HIGH (ref 70–99)
POTASSIUM: 3.1 mmol/L — AB (ref 3.5–5.1)
SODIUM: 132 mmol/L — AB (ref 135–145)
TOTAL PROTEIN: 9.9 g/dL — AB (ref 6.0–8.3)
Total Bilirubin: 0.3 mg/dL (ref 0.3–1.2)

## 2015-01-21 LAB — CBC WITH DIFFERENTIAL/PLATELET
BASOS PCT: 0 % (ref 0–1)
Basophils Absolute: 0 10*3/uL (ref 0.0–0.1)
EOS ABS: 0.2 10*3/uL (ref 0.0–0.7)
EOS PCT: 3 % (ref 0–5)
HEMATOCRIT: 37 % (ref 36.0–46.0)
HEMOGLOBIN: 12.2 g/dL (ref 12.0–15.0)
Lymphocytes Relative: 30 % (ref 12–46)
Lymphs Abs: 2.3 10*3/uL (ref 0.7–4.0)
MCH: 26.8 pg (ref 26.0–34.0)
MCHC: 33 g/dL (ref 30.0–36.0)
MCV: 81.1 fL (ref 78.0–100.0)
Monocytes Absolute: 0.9 10*3/uL (ref 0.1–1.0)
Monocytes Relative: 12 % (ref 3–12)
Neutro Abs: 4.1 10*3/uL (ref 1.7–7.7)
Neutrophils Relative %: 55 % (ref 43–77)
PLATELETS: 447 10*3/uL — AB (ref 150–400)
RBC: 4.56 MIL/uL (ref 3.87–5.11)
RDW: 15.4 % (ref 11.5–15.5)
WBC: 7.6 10*3/uL (ref 4.0–10.5)

## 2015-01-21 LAB — URINE MICROSCOPIC-ADD ON

## 2015-01-21 LAB — URINALYSIS, ROUTINE W REFLEX MICROSCOPIC
Bilirubin Urine: NEGATIVE
GLUCOSE, UA: NEGATIVE mg/dL
Hgb urine dipstick: NEGATIVE
Ketones, ur: NEGATIVE mg/dL
NITRITE: NEGATIVE
PROTEIN: NEGATIVE mg/dL
SPECIFIC GRAVITY, URINE: 1.014 (ref 1.005–1.030)
Urobilinogen, UA: 1 mg/dL (ref 0.0–1.0)
pH: 6 (ref 5.0–8.0)

## 2015-01-21 LAB — LIPASE, BLOOD: Lipase: 34 U/L (ref 11–59)

## 2015-01-21 MED ORDER — SODIUM CHLORIDE 0.9 % IV SOLN
1000.0000 mL | INTRAVENOUS | Status: DC
  Administered 2015-01-21: 1000 mL via INTRAVENOUS

## 2015-01-21 MED ORDER — ONDANSETRON 4 MG PO TBDP
4.0000 mg | ORAL_TABLET | Freq: Once | ORAL | Status: AC
  Administered 2015-01-21: 4 mg via ORAL
  Filled 2015-01-21: qty 1

## 2015-01-21 MED ORDER — DICYCLOMINE HCL 20 MG PO TABS: 20.0000 mg | ORAL_TABLET | Freq: Two times a day (BID) | ORAL | Status: DC

## 2015-01-21 MED ORDER — ESOMEPRAZOLE MAGNESIUM 40 MG PO CPDR: 40.0000 mg | DELAYED_RELEASE_CAPSULE | Freq: Every day | ORAL | Status: DC

## 2015-01-21 MED ORDER — GI COCKTAIL ~~LOC~~
30.0000 mL | Freq: Once | ORAL | Status: AC
  Administered 2015-01-21: 30 mL via ORAL
  Filled 2015-01-21: qty 30

## 2015-01-21 MED ORDER — POTASSIUM CHLORIDE CRYS ER 20 MEQ PO TBCR
40.0000 meq | EXTENDED_RELEASE_TABLET | Freq: Once | ORAL | Status: AC
  Administered 2015-01-21: 40 meq via ORAL
  Filled 2015-01-21: qty 2

## 2015-01-21 MED ORDER — ONDANSETRON HCL 4 MG PO TABS: 4.0000 mg | ORAL_TABLET | Freq: Three times a day (TID) | ORAL | Status: DC | PRN

## 2015-01-21 MED ORDER — SODIUM CHLORIDE 0.9 % IV SOLN
1000.0000 mL | Freq: Once | INTRAVENOUS | Status: AC
  Administered 2015-01-21: 1000 mL via INTRAVENOUS

## 2015-01-21 MED ORDER — ONDANSETRON HCL 4 MG/2ML IJ SOLN
4.0000 mg | Freq: Once | INTRAMUSCULAR | Status: AC
  Administered 2015-01-21: 4 mg via INTRAVENOUS
  Filled 2015-01-21: qty 2

## 2015-01-21 MED ORDER — SUCRALFATE 1 G PO TABS: 1.0000 g | ORAL_TABLET | Freq: Three times a day (TID) | ORAL | Status: DC

## 2015-01-21 NOTE — ED Notes (Addendum)
N/v x 3 weeks-was seen by GI "before Christmas" with work up for same c/o

## 2015-01-21 NOTE — ED Notes (Signed)
Patient transported to X-ray via stretcher per tech. 

## 2015-01-21 NOTE — Discharge Instructions (Signed)
Nausea and Vomiting Nausea is a sick feeling that often comes before throwing up (vomiting). Vomiting is a reflex where stomach contents come out of your mouth. Vomiting can cause severe loss of body fluids (dehydration). Children and elderly adults can become dehydrated quickly, especially if they also have diarrhea. Nausea and vomiting are symptoms of a condition or disease. It is important to find the cause of your symptoms. CAUSES   Direct irritation of the stomach lining. This irritation can result from increased acid production (gastroesophageal reflux disease), infection, food poisoning, taking certain medicines (such as nonsteroidal anti-inflammatory drugs), alcohol use, or tobacco use.  Signals from the brain.These signals could be caused by a headache, heat exposure, an inner ear disturbance, increased pressure in the brain from injury, infection, a tumor, or a concussion, pain, emotional stimulus, or metabolic problems.  An obstruction in the gastrointestinal tract (bowel obstruction).  Illnesses such as diabetes, hepatitis, gallbladder problems, appendicitis, kidney problems, cancer, sepsis, atypical symptoms of a heart attack, or eating disorders.  Medical treatments such as chemotherapy and radiation.  Receiving medicine that makes you sleep (general anesthetic) during surgery. DIAGNOSIS Your caregiver may ask for tests to be done if the problems do not improve after a few days. Tests may also be done if symptoms are severe or if the reason for the nausea and vomiting is not clear. Tests may include:  Urine tests.  Blood tests.  Stool tests.  Cultures (to look for evidence of infection).  X-rays or other imaging studies. Test results can help your caregiver make decisions about treatment or the need for additional tests. TREATMENT You need to stay well hydrated. Drink frequently but in small amounts.You may wish to drink water, sports drinks, clear broth, or eat frozen  ice pops or gelatin dessert to help stay hydrated.When you eat, eating slowly may help prevent nausea.There are also some antinausea medicines that may help prevent nausea. HOME CARE INSTRUCTIONS   Take all medicine as directed by your caregiver.  If you do not have an appetite, do not force yourself to eat. However, you must continue to drink fluids.  If you have an appetite, eat a normal diet unless your caregiver tells you differently.  Eat a variety of complex carbohydrates (rice, wheat, potatoes, bread), lean meats, yogurt, fruits, and vegetables.  Avoid high-fat foods because they are more difficult to digest.  Drink enough water and fluids to keep your urine clear or pale yellow.  If you are dehydrated, ask your caregiver for specific rehydration instructions. Signs of dehydration may include:  Severe thirst.  Dry lips and mouth.  Dizziness.  Dark urine.  Decreasing urine frequency and amount.  Confusion.  Rapid breathing or pulse. SEEK IMMEDIATE MEDICAL CARE IF:   You have blood or brown flecks (like coffee grounds) in your vomit.  You have black or bloody stools.  You have a severe headache or stiff neck.  You are confused.  You have severe abdominal pain.  You have chest pain or trouble breathing.  You do not urinate at least once every 8 hours.  You develop cold or clammy skin.  You continue to vomit for longer than 24 to 48 hours.  You have a fever. MAKE SURE YOU:   Understand these instructions.  Will watch your condition.  Will get help right away if you are not doing well or get worse. Document Released: 12/12/2005 Document Revised: 03/05/2012 Document Reviewed: 05/11/2011 [employer] Patient Information 2015 [employer]. This information is not intended   to replace advice given to you by your health care provider. Make sure you discuss any questions you have with your health care provider. Peptic Ulcer A peptic ulcer is a sore in the  lining of your esophagus (esophageal ulcer), stomach (gastric ulcer), or in the first part of your small intestine (duodenal ulcer). The ulcer causes erosion into the deeper tissue. CAUSES  Normally, the lining of the stomach and the small intestine protects itself from the acid that digests food. The protective lining can be damaged by:  An infection caused by a bacterium called Helicobacter pylori (H. pylori).  Regular use of nonsteroidal anti-inflammatory drugs (NSAIDs), such as ibuprofen or aspirin.  Smoking tobacco. Other risk factors include being older than 2, drinking alcohol excessively, and having a family history of ulcer disease.  SYMPTOMS   Burning pain or gnawing in the area between the chest and the belly button.  Heartburn.  Nausea and vomiting.  Bloating. The pain can be worse on an empty stomach and at night. If the ulcer results in bleeding, it can cause:  Black, tarry stools.  Vomiting of bright red blood.  Vomiting of coffee-ground-looking materials. DIAGNOSIS  A diagnosis is usually made based upon your history and an exam. Other tests and procedures may be performed to find the cause of the ulcer. Finding a cause will help determine the best treatment. Tests and procedures may include:  Blood tests, stool tests, or breath tests to check for the bacterium H. pylori.  An upper gastrointestinal (GI) series of the esophagus, stomach, and small intestine.  An endoscopy to examine the esophagus, stomach, and small intestine.  A biopsy. TREATMENT  Treatment may include:  Eliminating the cause of the ulcer, such as smoking, NSAIDs, or alcohol.  Medicines to reduce the amount of acid in your digestive tract.  Antibiotic medicines if the ulcer is caused by the H. pylori bacterium.  An upper endoscopy to treat a bleeding ulcer.  Surgery if the bleeding is severe or if the ulcer created a hole somewhere in the digestive system. HOME CARE INSTRUCTIONS    Avoid tobacco, alcohol, and caffeine. Smoking can increase the acid in the stomach, and continued smoking will impair the healing of ulcers.  Avoid foods and drinks that seem to cause discomfort or aggravate your ulcer.  Only take medicines as directed by your caregiver. Do not substitute over-the-counter medicines for prescription medicines without talking to your caregiver.  Keep any follow-up appointments and tests as directed. SEEK MEDICAL CARE IF:   Your do not improve within 7 days of starting treatment.  You have ongoing indigestion or heartburn. SEEK IMMEDIATE MEDICAL CARE IF:   You have sudden, sharp, or persistent abdominal pain.  You have bloody or dark black, tarry stools.  You vomit blood or vomit that looks like coffee grounds.  You become light-headed, weak, or feel faint.  You become sweaty or clammy. MAKE SURE YOU:   Understand these instructions.  Will watch your condition.  Will get help right away if you are not doing well or get worse. Document Released: 12/09/2000 Document Revised: 04/28/2014 Document Reviewed: 07/11/2012 [facility] Patient Information 2015 [place], [place]. This information is not intended to replace advice given to you by your health care provider. Make sure you discuss any questions you have with your health care provider. Ondansetron tablets What is this medicine? ONDANSETRON (on DAN se tron) is used to treat nausea and vomiting caused by chemotherapy. It is also used to prevent or treat  nausea and vomiting after surgery. This medicine may be used for other purposes; ask your health care provider or pharmacist if you have questions. COMMON BRAND NAME(S): Zofran What should I tell my health care provider before I take this medicine? They need to know if you have any of these conditions: -heart disease -history of irregular heartbeat -liver disease -low levels of magnesium or potassium in the blood -an unusual or allergic reaction  to ondansetron, granisetron, other medicines, foods, dyes, or preservatives -pregnant or trying to get pregnant -breast-feeding How should I use this medicine? Take this medicine by mouth with a glass of water. Follow the directions on your prescription label. Take your doses at regular intervals. Do not take your medicine more often than directed. Talk to your pediatrician regarding the use of this medicine in children. Special care may be needed. Overdosage: If you think you have taken too much of this medicine contact a poison control center or emergency room at once. NOTE: This medicine is only for you. Do not share this medicine with others. What if I miss a dose? If you miss a dose, take it as soon as you can. If it is almost time for your next dose, take only that dose. Do not take double or extra doses. What may interact with this medicine? Do not take this medicine with any of the following medications: -apomorphine -certain medicines for fungal infections like fluconazole, itraconazole, ketoconazole, posaconazole, voriconazole -cisapride -dofetilide -dronedarone -pimozide -thioridazine -ziprasidone This medicine may also interact with the following medications: -carbamazepine -certain medicines for depression, anxiety, or psychotic disturbances -fentanyl -linezolid -MAOIs like Carbex, Eldepryl, Marplan, Nardil, and Parnate -methylene blue (injected into a vein) -other medicines that prolong the QT interval (cause an abnormal heart rhythm) -phenytoin -rifampicin -tramadol This list may not describe all possible interactions. Give your health care provider a list of all the medicines, herbs, non-prescription drugs, or dietary supplements you use. Also tell them if you smoke, drink alcohol, or use illegal drugs. Some items may interact with your medicine. What should I watch for while using this medicine? Check with your doctor or health care professional right away if you  have any sign of an allergic reaction. What side effects may I notice from receiving this medicine? Side effects that you should report to your doctor or health care professional as soon as possible: -allergic reactions like skin rash, itching or hives, swelling of the face, lips or tongue -breathing problems -confusion -dizziness -fast or irregular heartbeat -feeling faint or lightheaded, falls -fever and chills -loss of balance or coordination -seizures -sweating -swelling of the hands or feet -tightness in the chest -tremors -unusually weak or tired Side effects that usually do not require medical attention (report to your doctor or health care professional if they continue or are bothersome): -constipation or diarrhea -headache This list may not describe all possible side effects. Call your doctor for medical advice about side effects. You may report side effects to FDA at 1-800-FDA-1088. Where should I keep my medicine? Keep out of the reach of children. Store between 2 and 30 degrees C (36 and 86 degrees F). Throw away any unused medicine after the expiration date. NOTE: This sheet is a summary. It may not cover all possible information. If you have questions about this medicine, talk to your doctor, pharmacist, or health care provider.  2015, [employer]. (2013-09-18 16:27:45) Esomeprazole capsules What is this medicine? ESOMEPRAZOLE (es oh ME pray zol) prevents the production of acid in  the stomach. It is used to treat gastroesophageal reflux disease (GERD), ulcers, certain bacteria in the stomach, and inflammation of the esophagus. It can also be used to prevent ulcers in patients taking medicines called NSAIDs. You can also buy this medicine without a prescription to treat the symptoms of heartburn. The non-prescription product is not for long-term use, unless otherwise directed by your doctor or health care professional. This medicine may be used for other  purposes; ask your health care provider or pharmacist if you have questions. COMMON BRAND NAME(S): Nexium, Nexium 24HR What should I tell my health care provider before I take this medicine? They need to know if you have any of these conditions: -bloody or black, tarry stools -chest pain -have had heartburn for over 3 months -have heartburn with dizziness, lightheadedness, or sweating -liver disease -low levels of magnesium in the blood -nausea, vomiting -stomach pain -trouble swallowing -unexplained weight loss -vomiting with blood -wheezing -an unusual or allergic reaction to esomeprazole, other medicines, foods, dyes, or preservatives -pregnant or trying to get pregnant -breast-feeding How should I use this medicine? Take this medicine by mouth. Swallow the capsules whole with a drink of water. Follow the directions on the prescription or product label. Do not crush, break or chew. The capsules can be opened and the contents sprinkled on applesauce. Do not crush the contents into the food. This medicine works best if taken on an empty stomach at least one hour before a meal. Take your medicine at regular intervals. Do not take your medicine more often than directed. Talk to your pediatrician regarding the use of this medicine in children. Special care may be needed. Overdosage: If you think you have taken too much of this medicine contact a poison control center or emergency room at once. NOTE: This medicine is only for you. Do not share this medicine with others. What if I miss a dose? If you miss a dose, take it as soon as you can. If it is almost time for your next dose, take only that dose. Do not take double or extra doses. What may interact with this medicine? Do not take this medicine with any of the following medications: -atazanavir This medicine may also interact with the following medications: -ampicillin -antiviral medicines for HIV or AIDS -certain medicines for fungal  infections like ketoconazole and itraconazole -cilostazol -clopidogrel -diazepam -digoxin -erlotinib -diuretics -iron salts -methotrexate -St. John's Wort -tacrolimus -rifampin -warfarin This list may not describe all possible interactions. Give your health care provider a list of all the medicines, herbs, non-prescription drugs, or dietary supplements you use. Also tell them if you smoke, drink alcohol, or use illegal drugs. Some items may interact with your medicine. What should I watch for while using this medicine? If you are taking this medicine without a prescription, it may take 1 to 4 days for it to fully relieve your heartburn. If you are using this medicine with a prescription from your healthcare professional for a more serious condition, it can take several days before your condition gets better. Check with your doctor or health care professional if your condition does not start to get better, or if it gets worse. If you take this medicine for long periods of time, you may need blood work done. What side effects may I notice from receiving this medicine? Side effects that you should report to your doctor or health care professional as soon as possible: -allergic reactions like skin rash, itching or hives, swelling of the  face, lips, or tongue -bone, muscle or joint pain -breathing problems -chest pain or chest tightness -dark yellow or brown urine -fast, irregular heartbeat -feeling faint or lightheaded -fever or sore throat -muscle spasms -tremors -unusual bleeding or bruising -unusually weak or tired -upset stomach -yellowing of the eyes or skin Side effects that usually do not require medical attention (Report these to your doctor or health care professional if they continue or are bothersome.): -constipation -diarrhea -dry mouth -headache -nausea -stomach pain or gas -vomiting This list may not describe all possible side effects. Call your doctor for medical  advice about side effects. You may report side effects to FDA at 1-800-FDA-1088. Where should I keep my medicine? Keep out of the reach of children. Store at room temperature between 15 and 30 degrees C (59 and 86 degrees F). Protect from light and moisture. Throw away any unused medicine after the expiration date. NOTE: This sheet is a summary. It may not cover all possible information. If you have questions about this medicine, talk to your doctor, pharmacist, or health care provider.  2015, [employer]. (2013-03-26 14:58:20) Dicyclomine tablets or capsules What is this medicine? DICYCLOMINE (dye SYE kloe meen) is used to treat bowel problems including irritable bowel syndrome. This medicine may be used for other purposes; ask your health care provider or pharmacist if you have questions. COMMON BRAND NAME(S): Bentyl What should I tell my health care provider before I take this medicine? They need to know if you have any of these conditions: -difficulty passing urine -esophagus problems or heartburn -glaucoma -heart disease, or previous heart attack -myasthenia gravis -prostate trouble -stomach infection, or obstruction -ulcerative colitis -an unusual or allergic reaction to dicyclomine, other medicines, foods, dyes, or preservatives -pregnant or trying to get pregnant -breast-feeding How should I use this medicine? Take this medicine by mouth with a glass of water. Follow the directions on the prescription label. It is best to take this medicine on an empty stomach, 30 minutes to 1 hour before meals. Take your medicine at regular intervals. Do not take your medicine more often than directed. Talk to your pediatrician regarding the use of this medicine in children. Special care may be needed. While this drug may be prescribed for children as young as 34 months of age for selected conditions, precautions do apply. Patients over 56 years old may have a stronger reaction and need  a smaller dose. Overdosage: If you think you have taken too much of this medicine contact a poison control center or emergency room at once. NOTE: This medicine is only for you. Do not share this medicine with others. What if I miss a dose? If you miss a dose, take it as soon as you can. If it is almost time for your next dose, take only that dose. Do not take double or extra doses. What may interact with this medicine? -amantadine -antacids -benztropine -digoxin -disopyramide -medicines for allergies, colds and breathing difficulties -medicines for alzheimer's disease -medicines for anxiety or sleeping problems -medicines for depression or psychotic disturbances -medicines for diarrhea -medicines for pain -metoclopramide -tegaserod This list may not describe all possible interactions. Give your health care provider a list of all the medicines, herbs, non-prescription drugs, or dietary supplements you use. Also tell them if you smoke, drink alcohol, or use illegal drugs. Some items may interact with your medicine. What should I watch for while using this medicine? You may get drowsy, dizzy, or have blurred vision. Do not drive, use machinery,  or do anything that needs mental alertness until you know how this medicine affects you. To reduce the risk of dizzy or fainting spells, do not sit or stand up quickly, especially if you are an older patient. Alcohol can make you more drowsy, avoid alcoholic drinks. Stay out of bright light and wear sunglasses if this medicine makes your eyes more sensitive to light. Avoid extreme heat (hot tubs, saunas). This medicine can cause you to sweat less than normal. Your body temperature could increase to dangerous levels, which may lead to heat stroke. Antacids can stop this medicine from working. If you get an upset stomach and want to take an antacid, make sure there is an interval of at least 1 to 2 hours before or after you take this medicine. Your mouth  may get dry. Chewing sugarless gum or sucking hard candy, and drinking plenty of water may help. Contact your doctor if the problem does not go away or is severe. What side effects may I notice from receiving this medicine? Side effects that you should report to your doctor or health care professional as soon as possible: -agitation, nervousness, confusion -difficulty swallowing -dizziness, drowsiness -fast or slow heartbeat -hallucinations -pain or difficulty passing urine Side effects that usually do not require medical attention (report to your doctor or health care professional if they continue or are bothersome): -constipation -headache -nausea or vomiting -sexual difficulty This list may not describe all possible side effects. Call your doctor for medical advice about side effects. You may report side effects to FDA at 1-800-FDA-1088. Where should I keep my medicine? Keep out of the reach of children. Store at room temperature below 30 degrees C (86 degrees F). Protect from light. Throw away any unused medicine after the expiration date. NOTE: This sheet is a summary. It may not cover all possible information. If you have questions about this medicine, talk to your doctor, pharmacist, or health care provider.  2015, [employer]. (2008-04-01 17:12:34) Sucralfate tablets What is this medicine? SUCRALFATE (SOO kral fate) helps to treat ulcers of the intestine. This medicine may be used for other purposes; ask your health care provider or pharmacist if you have questions. COMMON BRAND NAME(S): Carafate What should I tell my health care provider before I take this medicine? They need to know if you have any of these conditions: -kidney disease -an unusual or allergic reaction to sucralfate, other medicines, foods, dyes, or preservatives -pregnant or trying to get pregnant -breast-feeding How should I use this medicine? Take this medicine by mouth with a glass of water.  Follow the directions on the prescription label. This medicine works best if you take it on an empty stomach, 1 hour before meals. Take your doses at regular intervals. Do not take your medicine more often than directed. Do not stop taking except on your doctor's advice. Talk to your pediatrician regarding the use of this medicine in children. Special care may be needed. Overdosage: If you think you have taken too much of this medicine contact a poison control center or emergency room at once. NOTE: This medicine is only for you. Do not share this medicine with others. What if I miss a dose? If you miss a dose, take it as soon as you can. If it is almost time for your next dose, take only that dose. Do not take double or extra doses. What may interact with this medicine? -antacid -cimetidine -digoxin -ketoconazole -phenytoin -quinidine -ranitidine -some antibiotics like ciprofloxacin, norfloxacin, and ofloxacin -  theophylline -thyroid hormones -warfarin This list may not describe all possible interactions. Give your health care provider a list of all the medicines, herbs, non-prescription drugs, or dietary supplements you use. Also tell them if you smoke, drink alcohol, or use illegal drugs. Some items may interact with your medicine. What should I watch for while using this medicine? Visit your doctor or health care professional for regular check ups. Let your doctor know if your symptoms do not improve or if you feel worse. Antacids should not be taken within one half hour before or after this medicine. What side effects may I notice from receiving this medicine? Side effects that you should report to your doctor or health care professional as soon as possible: -allergic reactions like skin rash, itching or hives, swelling of the face, lips, or tongue -difficulty breathing Side effects that usually do not require medical attention (report to your doctor or health care professional if they  continue or are bothersome): -back pain -constipation -drowsy, dizzy -dry mouth -headache -stomach upset, gas -trouble sleeping This list may not describe all possible side effects. Call your doctor for medical advice about side effects. You may report side effects to FDA at 1-800-FDA-1088. Where should I keep my medicine? Keep out of the reach of children. Store at room temperature between 15 and 30 degrees C (59 and 86 degrees F). Keep container tightly closed. Throw away any unused medicine after the expiration date. NOTE: This sheet is a summary. It may not cover all possible information. If you have questions about this medicine, talk to your doctor, pharmacist, or health care provider.  2015, [employer] Standard. (2008-08-13 15:46:20)

## 2015-01-21 NOTE — ED Provider Notes (Signed)
CSN: [id number]     Arrival date & time 01/21/15  1748 History   First MD Initiated Contact with Patient 01/21/15 1924     Chief Complaint  Patient presents with  . Emesis     (Consider location/radiation/quality/duration/timing/severity/associated sxs/prior Treatment) HPI   Is a 44 year old female who presents emergency Department with chief complaint of vomiting. The patient states that her vomiting has been constant since 12/19/2014. She has had a GI workup through her our GI specialist and has had workup which includes a HIDA scan. Patient is unsure of the results. She shows me a medication bottle of glycopyrrolate which is what she was given for her "ulcers." She states that every time she eats solid foods. She vomits. The only thing she has been able to tolerate are clear liquids like water, cranberry juice and Gatorade's. She's had a 15 pound weight loss per the patient. She has not been to our facility and I have no records on her here. She denies any constipation, diarrhea. She is complaining of burning esophageal pain and a feeling of a lump in her throat. She states she feels it is likely because she has vomited so many times.  Past Medical History  Diagnosis Date  . Thyroid disease   . Hypertension   . Gallstones    Past Surgical History  Procedure Laterality Date  . Right knee surgery    . Abdominal hysterectomy     Family History  Problem Relation Age of Onset  . Hypertension Mother   . Diabetes Father   . Hypertension Father   . Heart attack Neg Hx    History  Substance Use Topics  . Smoking status: Never Smoker   . Smokeless tobacco: Not on file  . Alcohol Use: No   OB History    No data available     Review of Systems  Ten systems reviewed and are negative for acute change, except as noted in the HPI.    Allergies  Morphine and related and Percocet  Home Medications   Prior to Admission medications   Medication Sig Start Date End Date Taking?  Authorizing Provider  glycopyrrolate (ROBINUL) 2 MG tablet Take 2 mg by mouth 3 (three) times daily.   Yes Historical Provider, MD  NAPROXEN PO Take by mouth.   Yes Historical Provider, MD  dicyclomine (BENTYL) 20 MG tablet Take 1 tablet (20 mg total) by mouth 2 (two) times daily. 01/21/15   Margarita Mail, PA-C  esomeprazole (NEXIUM) 40 MG capsule Take 1 capsule (40 mg total) by mouth daily. 01/21/15   Margarita Mail, PA-C  hydrochlorothiazide (HYDRODIURIL) 25 MG tablet Take 25 mg by mouth daily.    Historical Provider, MD  levothyroxine (SYNTHROID, LEVOTHROID) 25 MCG tablet Take 25 mcg by mouth daily.    Historical Provider, MD  ondansetron (ZOFRAN) 4 MG tablet Take 1 tablet (4 mg total) by mouth every 8 (eight) hours as needed for nausea or vomiting. 01/21/15   Margarita Mail, PA-C  sucralfate (CARAFATE) 1 G tablet Take 1 tablet (1 g total) by mouth 4 (four) times daily -  with meals and at bedtime. 01/21/15   Johnrobert Foti, PA-C   BP 111/61 mmHg  Pulse 98  Temp(Src) 98.3 F (36.8 C) (Oral)  Resp 16  Ht 5\' 5"  (1.651 m)  Wt 245 lb (111.131 kg)  BMI 40.77 kg/m2  SpO2 97% Physical Exam  Constitutional: She is oriented to person, place, and time. She appears well-developed and well-nourished. No distress.  No active vomiting, Patient appears to be spitting  HENT:  Head: Normocephalic and atraumatic.  Eyes: Conjunctivae are normal. No scleral icterus.  Neck: Normal range of motion.  Cardiovascular: Normal rate, regular rhythm and normal heart sounds.  Exam reveals no gallop and no friction rub.   No murmur heard. Pulmonary/Chest: Effort normal and breath sounds normal. No respiratory distress.  Abdominal: Soft. Bowel sounds are normal. She exhibits no distension and no mass. There is tenderness (diffuse). There is no guarding.  Neurological: She is alert and oriented to person, place, and time.  Skin: Skin is warm and dry. She is not diaphoretic.  Nursing note and vitals reviewed.   ED  Course  Procedures (including critical care time) Labs Review Labs Reviewed  URINALYSIS, ROUTINE W REFLEX MICROSCOPIC - Abnormal; Notable for the following:    Leukocytes, UA TRACE (*)    All other components within normal limits  CBC WITH DIFFERENTIAL/PLATELET - Abnormal; Notable for the following:    Platelets 447 (*)    All other components within normal limits  COMPREHENSIVE METABOLIC PANEL - Abnormal; Notable for the following:    Sodium 132 (*)    Potassium 3.1 (*)    Glucose, Bld 116 (*)    Total Protein 9.9 (*)    Alkaline Phosphatase 137 (*)    GFR calc non Af Amer 73 (*)    GFR calc Af Amer 85 (*)    All other components within normal limits  LIPASE, BLOOD  URINE MICROSCOPIC-ADD ON    Imaging Review No results found.   EKG Interpretation None      MDM   Final diagnoses:  Non-intractable vomiting with nausea, vomiting of unspecified type   Patient with mild hyponatremia and hypokalemia, repleaed here in the ED.  her chemistry pane shows a slightly elevated alkphos. Given the fact that the patient recently had a hida scan I do not feel that a repeat US is necessary today.  Pateint was prescribed Glycopyrollate by her pcp for her vomiting. This is an agent that reduces salivary secretions, so I question if her vomiting is actually spitting. I also question PUD.   I have informed the patient of her CXR abnormalities and recommended CT follow up.  She will be referred to PCP.  patient is tolerating PO fluids after antiemetics.  dc with  Bentyl, nexium, zofran, and sucralfate.   The patient appears reasonably screened and/or stabilized for discharge and I doubt any other medical condition or other [facility] requiring further screening, evaluation, or treatment in the ED at this time prior to discharge.     Margarita Mail, PA-C 02/02/15 [place], MD 02/04/15 [place], MD 02/04/15 0623  Fredia Sorrow, MD 02/04/15 [phone]

## 2015-01-21 NOTE — ED Notes (Signed)
C/o fatigue and unable to keep food down,  States vomits everytime after eating w some abd pain x 3 weeks  States has lost 15 lbs in last 3 weeks

## 2015-01-28 ENCOUNTER — Ambulatory Visit: Payer: 59 | Admitting: Family

## 2015-02-18 ENCOUNTER — Ambulatory Visit (INDEPENDENT_AMBULATORY_CARE_PROVIDER_SITE_OTHER): Payer: 59 | Admitting: Family

## 2015-02-18 ENCOUNTER — Encounter: Payer: Self-pay | Admitting: Family

## 2015-02-18 VITALS — BP 101/59 | HR 81 | Temp 98.1°F | Resp 18 | Ht 64.75 in | Wt 237.6 lb

## 2015-02-18 DIAGNOSIS — I10 Essential (primary) hypertension: Secondary | ICD-10-CM

## 2015-02-18 DIAGNOSIS — R634 Abnormal weight loss: Secondary | ICD-10-CM

## 2015-02-18 DIAGNOSIS — R1013 Epigastric pain: Secondary | ICD-10-CM

## 2015-02-18 DIAGNOSIS — E876 Hypokalemia: Secondary | ICD-10-CM

## 2015-02-18 DIAGNOSIS — E039 Hypothyroidism, unspecified: Secondary | ICD-10-CM

## 2015-02-18 LAB — BASIC METABOLIC PANEL
BUN: 9 mg/dL (ref 6–23)
CO2: 30 mEq/L (ref 19–32)
Calcium: 9.8 mg/dL (ref 8.4–10.5)
Chloride: 99 mEq/L (ref 96–112)
Creatinine, Ser: 0.73 mg/dL (ref 0.40–1.20)
GFR: 111.43 mL/min (ref >60.00)
Glucose, Bld: 100 mg/dL — ABNORMAL HIGH (ref 70–99)
POTASSIUM: 3.3 meq/L — AB (ref 3.5–5.1)
Sodium: 136 mEq/L (ref 135–145)

## 2015-02-18 LAB — TSH: TSH: 1.08 u[IU]/mL (ref 0.35–4.50)

## 2015-02-18 MED ORDER — LEVOTHYROXINE SODIUM 125 MCG PO TABS: 125.0000 ug | ORAL_TABLET | Freq: Every day | ORAL | Status: DC

## 2015-02-18 NOTE — Progress Notes (Signed)
Pre visit review using our clinic review tool, if applicable. No additional management support is needed unless otherwise documented below in the visit note. 

## 2015-02-18 NOTE — Assessment & Plan Note (Signed)
She will be seeing GI tomorrow and I have advised her to let him know that nexium was too expensive.  She will ask GI to send me their office visit.  Will defer further work up of nausea/vomiting, weight loss to GI. I think she could benefit from EGD at this time and possibly consider CT abd/pelvis for further evaluation. Defer management to GI.

## 2015-02-18 NOTE — Patient Instructions (Signed)
Please complete lab work prior to leaving. Keep your upcoming appointment with GI tomorrow. Schedule a fasting physical at the front desk.   Welcome to [facility]!

## 2015-02-18 NOTE — Progress Notes (Signed)
Subjective:    Patient ID: Elizabeth Riggs, female    DOB: 18-Feb-1971, 44 y.o.   MRN: [id number]  HPI  Elizabeth Riggs is a 44 yr old female who presents today to establish care.    Pmhx is significant for:  1) HTN-she is maintained on hctz 25mg .  (GYN has been managing bp and thyroid meds- has not had pcp) BP Readings from Last 3 Encounters:  02/18/15 101/59  01/21/15 111/61  08/05/13 136/80   2) Hypothyroid- maintained on synthroid.  Has had significant weight loss    3) Nausea/weight loss-Reports that symptoms started before christmas.  She sees Dr.Rampsey ([facility] [place]). She reports that he ordered an [place] which showed gallstones. Per pt he then ordered a hida scan which was reportedly normal.  She reports cramping pain after eating, often followed by vomiting. She was prescribed nexium by GI but did not start due to cost >$200. She reports that she can tolerate small amounts of food/liquid such as crackers, but if she tires to eat a who sandwich she vomits.  She does have rx for zofran prn which she states does help. She reports unintentional weight loss of 25 pounds since christmas. She was seen in our ED on 1/27 for same and ED records are reviewed. Wt Readings from Last 3 Encounters:  02/18/15 237 lb 9.6 oz (107.775 kg)  01/21/15 245 lb (111.131 kg)  08/05/13 230 lb (104.327 kg)     Review of Systems  Constitutional: Positive for unexpected weight change.  HENT: Negative for hearing loss and rhinorrhea.   Eyes: Negative for visual disturbance.  Respiratory: Negative for cough.   Cardiovascular: Negative for leg swelling.  Gastrointestinal: Positive for nausea, vomiting and diarrhea.  Genitourinary: Negative for dysuria and frequency.  Musculoskeletal: Negative for myalgias and arthralgias.  Skin: Negative for rash.  Neurological: Negative for headaches.  Hematological: Negative for adenopathy.  Psychiatric/Behavioral:       Denies depression/anxiety    See HPI  Past Medical History  Diagnosis Date  . Thyroid disease   . Hypertension   . Gallstones     History   Social History  . Marital Status: Single    Spouse Name: N/A  . Number of Children: N/A  . Years of Education: N/A   Occupational History  . Not on file.   Social History Main Topics  . Smoking status: Never Smoker   . Smokeless tobacco: Not on file  . Alcohol Use: No  . Drug Use: No  . Sexual Activity: Not on file   Other Topics Concern  . Not on file   Social History Narrative    Past Surgical History  Procedure Laterality Date  . Right knee surgery    . Abdominal hysterectomy      Family History  Problem Relation Age of Onset  . Hypertension Mother   . Diabetes Father   . Hypertension Father   . Heart attack Neg Hx   . Cancer Maternal Grandmother     breast  . Cancer Maternal Grandfather     prostate    Allergies  Allergen Reactions  . Morphine And Related Itching  . Percocet [Oxycodone-Acetaminophen]     Current Outpatient Prescriptions on File Prior to Visit  Medication Sig Dispense Refill  . hydrochlorothiazide (HYDRODIURIL) 25 MG tablet Take 25 mg by mouth daily.    . ondansetron (ZOFRAN) 4 MG tablet Take 1 tablet (4 mg total) by mouth every 8 (eight) hours as needed for  nausea or vomiting. (Patient not taking: Reported on 02/18/2015) 10 tablet 0   No current facility-administered medications on file prior to visit.    BP 101/59 mmHg  Pulse 81  Temp(Src) 98.1 F (36.7 C) (Oral)  Resp 18  Ht 5' 4.75" (1.645 m)  Wt 237 lb 9.6 oz (107.775 kg)  BMI 39.83 kg/m2  SpO2 99%       Objective:   Physical Exam  Constitutional: She is oriented to person, place, and time. She appears well-developed and well-nourished.  HENT:  Head: Normocephalic and atraumatic.  Right Ear: Tympanic membrane and ear canal normal.  Left Ear: Tympanic membrane and ear canal normal.  Mouth/Throat: No oropharyngeal exudate, posterior oropharyngeal  edema or posterior oropharyngeal erythema.  Eyes: Pupils are equal, round, and reactive to light.  Neck: No thyromegaly present.  Cardiovascular: Normal rate, regular rhythm and normal heart sounds.   No murmur heard. Pulmonary/Chest: Effort normal and breath sounds normal. No respiratory distress. She has no wheezes.  Abdominal: Soft. Bowel sounds are normal. She exhibits no distension. There is no tenderness. There is no rebound and no guarding.  Musculoskeletal: She exhibits no edema.  Lymphadenopathy:    She has no cervical adenopathy.  Neurological: She is alert and oriented to person, place, and time.  Skin: Skin is warm and dry.  Psychiatric: She has a normal mood and affect. Her behavior is normal. Judgment and thought content normal.          Assessment & Plan:

## 2015-02-18 NOTE — Assessment & Plan Note (Signed)
Obtain tsh in setting of weight loss. Continue synthroid.

## 2015-02-18 NOTE — Assessment & Plan Note (Signed)
BP a bit low today,though she does not appear dehydrated. Continue HCTZ for now. Obtain follow up bmet to assess K (was low in ED).  If bp remains low next visit, consider d/c hctz.

## 2015-02-19 ENCOUNTER — Telehealth: Payer: Self-pay | Admitting: Family

## 2015-02-19 DIAGNOSIS — E876 Hypokalemia: Secondary | ICD-10-CM

## 2015-02-19 MED ORDER — POTASSIUM CHLORIDE CRYS ER 20 MEQ PO TBCR: EXTENDED_RELEASE_TABLET | ORAL | Status: DC

## 2015-02-19 NOTE — Telephone Encounter (Signed)
Potassium is low. Add Kdur 15mEQ once daily for 1 week.  Repeat bmet in 1 week, dx hypokalemia.

## 2015-02-19 NOTE — Telephone Encounter (Signed)
Phone # listed is non-working number.  Mailed letter to pt. Future lab order entered.

## 2015-03-12 HISTORY — PX: CHOLECYSTECTOMY: SHX55

## 2015-03-19 ENCOUNTER — Encounter: Payer: Self-pay | Admitting: Family

## 2015-03-19 ENCOUNTER — Ambulatory Visit (HOSPITAL_BASED_OUTPATIENT_CLINIC_OR_DEPARTMENT_OTHER)
Admission: RE | Admit: 2015-03-19 | Discharge: 2015-03-19 | Disposition: A | Discharge status: Home / Self Care | Payer: 59 | Source: Ambulatory Visit | Attending: Family | Admitting: Family

## 2015-03-19 ENCOUNTER — Telehealth: Payer: Self-pay | Admitting: Family

## 2015-03-19 ENCOUNTER — Ambulatory Visit (INDEPENDENT_AMBULATORY_CARE_PROVIDER_SITE_OTHER): Payer: 59 | Admitting: Family

## 2015-03-19 VITALS — BP 100/78 | HR 114 | Temp 98.3°F | Resp 18 | Ht 64.75 in | Wt 226.0 lb

## 2015-03-19 DIAGNOSIS — K5900 Constipation, unspecified: Secondary | ICD-10-CM | POA: Diagnosis not present

## 2015-03-19 DIAGNOSIS — R112 Nausea with vomiting, unspecified: Secondary | ICD-10-CM | POA: Diagnosis not present

## 2015-03-19 DIAGNOSIS — I10 Essential (primary) hypertension: Secondary | ICD-10-CM | POA: Diagnosis not present

## 2015-03-19 LAB — CBC WITH DIFFERENTIAL/PLATELET
BASOS ABS: 0 10*3/uL (ref 0.0–0.1)
BASOS PCT: 0.5 % (ref 0.0–3.0)
EOS ABS: 0.1 10*3/uL (ref 0.0–0.7)
Eosinophils Relative: 1.8 % (ref 0.0–5.0)
HCT: 37.6 % (ref 36.0–46.0)
Hemoglobin: 12.5 g/dL (ref 12.0–15.0)
LYMPHS ABS: 1.6 10*3/uL (ref 0.7–4.0)
LYMPHS PCT: 20.9 % (ref 12.0–46.0)
MCHC: 33.2 g/dL (ref 30.0–36.0)
MCV: 76.7 fl — AB (ref 78.0–100.0)
Monocytes Absolute: 0.8 10*3/uL (ref 0.1–1.0)
Monocytes Relative: 10.3 % (ref 3.0–12.0)
NEUTROS ABS: 5.1 10*3/uL (ref 1.4–7.7)
NEUTROS PCT: 66.5 % (ref 43.0–77.0)
PLATELETS: 450 10*3/uL — AB (ref 150.0–400.0)
RBC: 4.9 Mil/uL (ref 3.87–5.11)
RDW: 18.6 % — ABNORMAL HIGH (ref 11.5–15.5)
WBC: 7.6 10*3/uL (ref 4.0–10.5)

## 2015-03-19 LAB — BASIC METABOLIC PANEL
BUN: 7 mg/dL (ref 6–23)
CALCIUM: 9.9 mg/dL (ref 8.4–10.5)
CHLORIDE: 95 meq/L — AB (ref 96–112)
CO2: 29 meq/L (ref 19–32)
CREATININE: 0.84 mg/dL (ref 0.40–1.20)
GFR: 94.73 mL/min (ref >60.00)
GLUCOSE: 125 mg/dL — AB (ref 70–99)
Potassium: 3.2 mEq/L — ABNORMAL LOW (ref 3.5–5.1)
SODIUM: 133 meq/L — AB (ref 135–145)

## 2015-03-19 MED ORDER — POTASSIUM CHLORIDE 20 MEQ/15ML (10%) PO SOLN: 20.0000 meq | Freq: Every day | ORAL | Status: DC

## 2015-03-19 NOTE — Patient Instructions (Addendum)
Add colace 100mg  twice daily as needed for constipation.  Hold hctz.  Complete lab work prior to leaving. Keep your upcoming appointment with the surgeon next Thursday Go to the ER if you develop fever >101, if unable to keep down fluids, or if you develop abdominal pain. Follow up with [place] on 4/5.

## 2015-03-19 NOTE — Assessment & Plan Note (Addendum)
KUB is performed and is negative for obstruction or ileus. Recommend miralax today add colace prn.  Advance diet as tolerated.  Will also obtain CBC to assess for abnormal white count given recent cholecystectomy. She is advised to keep her post op apt next week with her surgeon. Advised pt to go to the ER if you develop fever >101, if unable to keep down fluids, or if you develop abdominal pain. Follow up with [place] on 4/5.

## 2015-03-19 NOTE — Progress Notes (Signed)
Pre visit review using our clinic review tool, if applicable. No additional management support is needed unless otherwise documented below in the visit note. 

## 2015-03-19 NOTE — Progress Notes (Signed)
Left message to return my call.  

## 2015-03-19 NOTE — Assessment & Plan Note (Signed)
Clinically mildly dehydrated. BP overtreated.   Advised pt to hold hctz Obtain bmet to assess hypokalemia.

## 2015-03-19 NOTE — Progress Notes (Signed)
Subjective:    Patient ID: Elizabeth Riggs, female    DOB: [birth year], 44 y.o.   MRN: [id number]  HPI   Ms. Elizabeth Riggs is a 44 yr old female who presents today with complaint of nausea and vomiting. She is s/p lap chole on 3/17- this was performed as a day surgery procedure.   She reports that she ate soup the first day home and kept it down. She then tried to eat potatos and fish on the 19th and vomitted. The next morning she tried to eat cereal, then vomitted.  Had BM yesterday small and hard.  Reports constipation due to hydrocodone.  + vomitting, unable to take potassium pills because too large to swallow. Reports + chills at night (has not checked for fever) Does report + productive cough.  Only drinking cran juice.  She ate some cheese toast and kept it down. Has been having some AM headaches.  BP Readings from Last 3 Encounters:  03/19/15 100/78  02/18/15 101/59  01/21/15 111/61   She has post op check- surgeon in [place]. Dr. Blanchard Riggs  Denies abdominal pain.   Review of Systems     Past Medical History  Diagnosis Date  . Thyroid disease   . Hypertension   . Gallstones     History   Social History  . Marital Status: Single    Spouse Name: N/A  . Number of Children: N/A  . Years of Education: N/A   Occupational History  . Not on file.   Social History Main Topics  . Smoking status: Never Smoker   . Smokeless tobacco: Not on file  . Alcohol Use: No  . Drug Use: No  . Sexual Activity: Not on file   Other Topics Concern  . Not on file   Social History Narrative   She works as a Quarry manager at [employer]   3 children- all live locally, she has 3 grand children.   69- son   28- son   74- son   Completed 12th grade   Enjoys church, spending time with grand children.       Past Surgical History  Procedure Laterality Date  . Right knee surgery    . Abdominal hysterectomy    . Cholecystectomy  03/12/15    [facility] in  [place]    Family History  Problem Relation Age of Onset  . Hypertension Mother   . Diabetes Father   . Hypertension Father   . Heart attack Neg Hx   . Cancer Maternal Grandmother     breast  . Cancer Maternal Grandfather     prostate    Allergies  Allergen Reactions  . Morphine And Related Itching  . Percocet [Oxycodone-Acetaminophen]     Current Outpatient Prescriptions on File Prior to Visit  Medication Sig Dispense Refill  . hydrochlorothiazide (HYDRODIURIL) 25 MG tablet Take 25 mg by mouth daily.    Marland Kitchen levothyroxine (SYNTHROID, LEVOTHROID) 125 MCG tablet Take 1 tablet (125 mcg total) by mouth daily.  0  . ondansetron (ZOFRAN) 4 MG tablet Take 1 tablet (4 mg total) by mouth every 8 (eight) hours as needed for nausea or vomiting. 10 tablet 0  . potassium chloride SA (K-DUR,KLOR-CON) 20 MEQ tablet Take 1 tablet by mouth daily for 7 days then repeat lab test. (Patient not taking: Reported on 03/19/2015) 7 tablet 0   No current facility-administered medications on file prior to visit.    BP 100/78 mmHg  Pulse 114  Temp(Src) 98.3 F (36.8 C) (Oral)  Resp 18  Ht 5' 4.75" (1.645 m)  Wt 226 lb (102.513 kg)  BMI 37.88 kg/m2  SpO2 99%    Objective:   Physical Exam  Constitutional: She appears well-developed and well-nourished. No distress.  Cardiovascular: Normal rate and regular rhythm.   No murmur heard. Pulmonary/Chest: Effort normal and breath sounds normal. No respiratory distress. She has no wheezes. She has no rales. She exhibits no tenderness.  Abdominal: Soft. She exhibits no distension. Bowel sounds are absent. There is no tenderness.  Abdominal dressings dry and intact, no redness.            Assessment & Plan:

## 2015-03-19 NOTE — Telephone Encounter (Signed)
Caller name: Nianna Relation to pt: self Call back number: Pharmacy:  Reason for call: Pt called today returning call. Please advise

## 2015-03-20 ENCOUNTER — Telehealth: Payer: Self-pay | Admitting: Family

## 2015-03-20 NOTE — Telephone Encounter (Signed)
See mychart.  

## 2015-03-23 ENCOUNTER — Telehealth: Payer: Self-pay | Admitting: Family

## 2015-03-23 NOTE — Telephone Encounter (Signed)
Spoke with patient and clarified lab results.  Patient stated understanding.

## 2015-03-23 NOTE — Telephone Encounter (Signed)
Spoke to patient and clarified lab results.  eal

## 2015-03-23 NOTE — Telephone Encounter (Signed)
Caller name: Mercedez, Boule Relation to pt: self  Call back number: [phone]   Reason for call:  Pt in need of clarification regarding x ray results. Pt reviewed on mychart and would like to discuss. Please advise

## 2015-03-24 NOTE — Telephone Encounter (Signed)
Called left message to call back 

## 2015-03-24 NOTE — Telephone Encounter (Signed)
Patient informed of results.  

## 2015-03-31 ENCOUNTER — Telehealth: Payer: Self-pay | Admitting: *Deleted

## 2015-03-31 ENCOUNTER — Ambulatory Visit (INDEPENDENT_AMBULATORY_CARE_PROVIDER_SITE_OTHER): Payer: 59 | Admitting: Family

## 2015-03-31 ENCOUNTER — Telehealth: Payer: Self-pay | Admitting: Family

## 2015-03-31 ENCOUNTER — Encounter: Payer: Self-pay | Admitting: Family

## 2015-03-31 VITALS — BP 110/68 | HR 91 | Temp 97.6°F | Resp 18 | Ht 64.75 in | Wt 236.2 lb

## 2015-03-31 DIAGNOSIS — I10 Essential (primary) hypertension: Secondary | ICD-10-CM

## 2015-03-31 DIAGNOSIS — E876 Hypokalemia: Secondary | ICD-10-CM

## 2015-03-31 DIAGNOSIS — J209 Acute bronchitis, unspecified: Secondary | ICD-10-CM | POA: Diagnosis not present

## 2015-03-31 DIAGNOSIS — R05 Cough: Secondary | ICD-10-CM | POA: Diagnosis not present

## 2015-03-31 DIAGNOSIS — R059 Cough, unspecified: Secondary | ICD-10-CM

## 2015-03-31 LAB — BASIC METABOLIC PANEL
BUN: 7 mg/dL (ref 6–23)
CHLORIDE: 104 meq/L (ref 96–112)
CO2: 29 mEq/L (ref 19–32)
CREATININE: 0.68 mg/dL (ref 0.40–1.20)
Calcium: 9.2 mg/dL (ref 8.4–10.5)
GFR: 120.88 mL/min (ref >60.00)
GLUCOSE: 97 mg/dL (ref 70–99)
Potassium: 3.1 mEq/L — ABNORMAL LOW (ref 3.5–5.1)
Sodium: 140 mEq/L (ref 135–145)

## 2015-03-31 MED ORDER — AZITHROMYCIN 250 MG PO TABS: ORAL_TABLET | ORAL | Status: DC

## 2015-03-31 NOTE — Telephone Encounter (Signed)
Please let pt know that her potassium is low despite being off of the hctz.  I would like her to take 40 mEQ (equal to 43ml)  of potassium now and in 4 hours, then take 20 mEQ (equal to 15 mls) once daily, repeat labs as pended below in 1 week.

## 2015-03-31 NOTE — Telephone Encounter (Signed)
Notified pt and she voiced understanding. I scheduled lab appt in error for tomorrow. Attempted to call pt and r/s for 1 week as instructed below and reached pt voicemail. Left detailed message on cell# that new lab appt will be 04/08/15 at 10:15 and to call to r/s if that time will not work for her. Lab appt for tomorrow has been cancelled.

## 2015-03-31 NOTE — Assessment & Plan Note (Signed)
Will rx with zithromax and obtain a CT chest to further evaluate for sarcoidosis.

## 2015-03-31 NOTE — Patient Instructions (Signed)
Start zpak (antibiotic)  You will be contacted about your CT scan. Please continue OFF of hctz.  Follow up in 3 months sooner if problems/ concerns.

## 2015-03-31 NOTE — Telephone Encounter (Signed)
Pt signed records release to Dr Ramsay (GI). Released faxed to [phone].

## 2015-03-31 NOTE — Progress Notes (Signed)
Subjective:    Patient ID: Elizabeth Riggs, female    DOB: [birth year], 44 y.o.   MRN: [id number]  HPI  [name] is a 44 yr old female who presents today for follow up.  1) HTN-  Patient is currently maintained on the following medications for blood pressure: none, stopped hctz Patient reports good compliance with blood pressure medications. Patient denies chest pain, shortness of breath or swelling. Reports some tightness with her breathing, but denies LE edema.  Last 3 blood pressure readings in our office are as follows:  BP Readings from Last 3 Encounters:  03/31/15 110/68  03/19/15 100/78  02/18/15 101/59   2) Hypokalemia-  Last potassium was 3.2 on 3/24. Took potassium this AM.    3) Cough- reports productive cough with yellow mucous since 03/19/15.  Her GI doctor suggested work up for sarcoid.   Had lap chole 3/17 and was told that she had a growth on her GB which was concerning for sarcoid.  Reviewed care everywhere and see GB path report:    THE PERICYSTIC DUCT LYMPH NODE SHOWS NUMEROUS NONCASEATING GRANULOMAS.  FIBRINOID NECROSIS WAS SOMETIMES SEEN WITHIN SOME FOCI. THE ETIOLOGY IS  UNCERTAIN BUT SARCOIDOSIS SHOULD BE EXCLUDED.   Sees Carolynn Comment MD- GI  Review of Systems See HPI  Past Medical History  Diagnosis Date  . Thyroid disease   . Hypertension   . Gallstones     History   Social History  . Marital Status: Single    Spouse Name: N/A  . Number of Children: N/A  . Years of Education: N/A   Occupational History  . Not on file.   Social History Main Topics  . Smoking status: Never Smoker   . Smokeless tobacco: Not on file  . Alcohol Use: No  . Drug Use: No  . Sexual Activity: Not on file   Other Topics Concern  . Not on file   Social History Narrative   She works as a Quarry manager at [employer]   3 children- all live locally, she has 3 grand children.   34- son   46- son   97- son   Completed 12th grade   Enjoys church,  spending time with grand children.       Past Surgical History  Procedure Laterality Date  . Right knee surgery    . Abdominal hysterectomy    . Cholecystectomy  03/12/15    [facility] in [place]    Family History  Problem Relation Age of Onset  . Hypertension Mother   . Diabetes Father   . Hypertension Father   . Heart attack Neg Hx   . Cancer Maternal Grandmother     breast  . Cancer Maternal Grandfather     prostate    Allergies  Allergen Reactions  . Morphine And Related Itching  . Percocet [Oxycodone-Acetaminophen]     itching    Current Outpatient Prescriptions on File Prior to Visit  Medication Sig Dispense Refill  . levothyroxine (SYNTHROID, LEVOTHROID) 125 MCG tablet Take 1 tablet (125 mcg total) by mouth daily.  0  . ondansetron (ZOFRAN) 4 MG tablet Take 1 tablet (4 mg total) by mouth every 8 (eight) hours as needed for nausea or vomiting. 10 tablet 0  . potassium chloride 20 MEQ/15ML (10%) SOLN Take 15 mLs (20 mEq total) by mouth daily. 473 mL 0   No current facility-administered medications on file prior to visit.    BP  110/68 mmHg  Pulse 91  Temp(Src) 97.6 F (36.4 C) (Oral)  Resp 18  Ht 5' 4.75" (1.645 m)  Wt 236 lb 3.2 oz (107.14 kg)  BMI 39.59 kg/m2  SpO2 99%       Objective:   Physical Exam  Constitutional: She appears well-developed and well-nourished.  HENT:  Head: Normocephalic and atraumatic.  Right Ear: Tympanic membrane and ear canal normal.  Left Ear: Tympanic membrane and ear canal normal.  Mouth/Throat: No oropharyngeal exudate, posterior oropharyngeal edema or posterior oropharyngeal erythema.  Cardiovascular: Normal rate, regular rhythm and normal heart sounds.   No murmur heard. Pulmonary/Chest: Effort normal and breath sounds normal. No respiratory distress. She has no wheezes.  Psychiatric: She has a normal mood and affect. Her behavior is normal. Judgment and thought content normal.            Assessment & Plan:

## 2015-03-31 NOTE — Assessment & Plan Note (Signed)
Lab Results  Component Value Date   K 3.1* 03/31/2015   Potassium still low.  Will replete and have her return for some additional testing to rule out hyperaldo.  See phone note.

## 2015-03-31 NOTE — Assessment & Plan Note (Signed)
BP stable, ok to remain of hctz.

## 2015-04-01 ENCOUNTER — Other Ambulatory Visit: Payer: 59

## 2015-04-01 ENCOUNTER — Telehealth: Payer: Self-pay | Admitting: Family

## 2015-04-01 ENCOUNTER — Ambulatory Visit (HOSPITAL_BASED_OUTPATIENT_CLINIC_OR_DEPARTMENT_OTHER)
Admission: RE | Admit: 2015-04-01 | Discharge: 2015-04-01 | Disposition: A | Discharge status: Home / Self Care | Payer: 59 | Source: Ambulatory Visit | Attending: Family | Admitting: Family

## 2015-04-01 DIAGNOSIS — R05 Cough: Secondary | ICD-10-CM | POA: Diagnosis present

## 2015-04-01 DIAGNOSIS — R911 Solitary pulmonary nodule: Secondary | ICD-10-CM | POA: Diagnosis not present

## 2015-04-01 DIAGNOSIS — R590 Localized enlarged lymph nodes: Secondary | ICD-10-CM | POA: Diagnosis not present

## 2015-04-01 DIAGNOSIS — R059 Cough, unspecified: Secondary | ICD-10-CM

## 2015-04-01 NOTE — Telephone Encounter (Signed)
Please contact pt and let her know that CT scan does show some enlarged lymph nodes in her chest. Sarcoidosis is a possibility.  I would like her to get in to see pulmonology for further evaluation.

## 2015-04-01 NOTE — Telephone Encounter (Signed)
Notified pt and she is agreeable to proceed with referral. 

## 2015-04-06 ENCOUNTER — Telehealth: Payer: Self-pay | Admitting: Family

## 2015-04-06 MED ORDER — BENZONATATE 100 MG PO CAPS: 100.0000 mg | ORAL_CAPSULE | Freq: Three times a day (TID) | ORAL | Status: DC | PRN

## 2015-04-06 NOTE — Telephone Encounter (Signed)
Notified pt. 

## 2015-04-06 NOTE — Telephone Encounter (Signed)
Caller name:Newkirk, Asmi Relation to UY:EBXI Call back [place]  Reason for call: pt would like for you to give her a call, states she is still coughing a lot and wanted to know if Melissa could call her in another rx , pt was seen on 03/19/15

## 2015-04-06 NOTE — Telephone Encounter (Signed)
Spoke with pt. Completed antibiotic. Still has wheezing and cough seems worse at night. 1st available with pulmonology will be May 26 and wants to know if it is ok to wait that long? What else should she do for her cough? Can she take Robitussin?

## 2015-04-06 NOTE — Telephone Encounter (Signed)
She can use tessalon pearls prn.  I will see if pulmonary can see her sooner.

## 2015-04-07 ENCOUNTER — Other Ambulatory Visit (INDEPENDENT_AMBULATORY_CARE_PROVIDER_SITE_OTHER): Payer: 59

## 2015-04-07 DIAGNOSIS — E876 Hypokalemia: Secondary | ICD-10-CM | POA: Diagnosis not present

## 2015-04-07 DIAGNOSIS — R739 Hyperglycemia, unspecified: Secondary | ICD-10-CM

## 2015-04-07 LAB — BASIC METABOLIC PANEL
BUN: 6 mg/dL (ref 6–23)
CO2: 31 meq/L (ref 19–32)
Calcium: 9.6 mg/dL (ref 8.4–10.5)
Chloride: 99 mEq/L (ref 96–112)
Creatinine, Ser: 0.74 mg/dL (ref 0.40–1.20)
GFR: 109.63 mL/min (ref >60.00)
Glucose, Bld: 104 mg/dL — ABNORMAL HIGH (ref 70–99)
Potassium: 3.5 mEq/L (ref 3.5–5.1)
SODIUM: 135 meq/L (ref 135–145)

## 2015-04-08 ENCOUNTER — Encounter: Payer: Self-pay | Admitting: Family

## 2015-04-08 DIAGNOSIS — R739 Hyperglycemia, unspecified: Secondary | ICD-10-CM | POA: Insufficient documentation

## 2015-04-09 ENCOUNTER — Ambulatory Visit (INDEPENDENT_AMBULATORY_CARE_PROVIDER_SITE_OTHER): Payer: 59 | Admitting: Pulmonary Disease

## 2015-04-09 ENCOUNTER — Encounter: Payer: Self-pay | Admitting: Pulmonary Disease

## 2015-04-09 VITALS — BP 119/83 | HR 124 | Temp 97.9°F | Ht 65.0 in | Wt 227.0 lb

## 2015-04-09 DIAGNOSIS — D869 Sarcoidosis, unspecified: Secondary | ICD-10-CM | POA: Diagnosis not present

## 2015-04-09 LAB — POTASSIUM, URINE, RANDOM: POTASSIUM UR: 44 meq/L

## 2015-04-09 MED ORDER — PREDNISONE 10 MG PO TABS: 10.0000 mg | ORAL_TABLET | Freq: Two times a day (BID) | ORAL | Status: DC

## 2015-04-09 NOTE — Progress Notes (Signed)
   Subjective:    Patient ID: Elizabeth Riggs, female    DOB: 03/10/1971, 44 y.o.   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 She presented in 02/2015 with weight loss, intractable nausea and vomiting Had lap chole 03/12/15  and was told that she had a growth on her GB  Pathology showed non-caseating granulomas. She subsequently developed a dry cough, without diurnal variation, denies sinus symptoms or heartburn. She was treated with cough syrup and Tessalon Perles without relief Chest x-ray 12/2014 showed right hilar prominence which was new compared to 05/2014 CT chest 04/01/15 - mediastinal lymphadenopathy-subcarinal and precarinal, scattered subcentimeter nodules CT abdomen from 2010 was reviewed  Labs showed hypokalemia-HCTZ has been stopped, calcium levels okay   Past Medical History  Diagnosis Date  . Thyroid disease   . Hypertension   . Gallstones     Past Surgical History  Procedure Laterality Date  . Right knee surgery    . Abdominal hysterectomy    . Cholecystectomy  03/12/15    [facility] in [place]    Allergies  Allergen Reactions  . Morphine And Related Itching  . Percocet [Oxycodone-Acetaminophen]     itching     History   Social History  . Marital Status: Single    Spouse Name: N/A  . Number of Children: N/A  . Years of Education: N/A   Occupational History  . Not on file.   Social History Main Topics  . Smoking status: Never Smoker   . Smokeless tobacco: Not on file  . Alcohol Use: No  . Drug Use: No  . Sexual Activity: Not on file   Other Topics Concern  . Not on file   Social History Narrative   She works as a Quarry manager at [employer]   3 children- all live locally, she has 3 grand children.   69- son   89- son   60- son   Completed 12th grade   Enjoys church, spending time with grand children.        Family History  Problem Relation Age of Onset   . Hypertension Mother   . Diabetes Father   . Hypertension Father   . Heart attack Neg Hx   . Cancer Maternal Grandmother     breast  . Cancer Maternal Grandfather     prostate     Review of Systems neg for any significant sore throat, dysphagia, itching, sneezing, nasal congestion or excess/ purulent secretions, fever, chills, sweats, unintended wt loss, pleuritic or exertional cp, hempoptysis, orthopnea pnd or change in chronic leg swelling. Also denies presyncope, palpitations, heartburn, abdominal pain, nausea, vomiting, diarrhea or change in bowel or urinary habits, dysuria,hematuria, rash, arthralgias, visual complaints, headache, numbness weakness or ataxia.     Objective:   Physical Exam  Gen. Pleasant, obese, in no distress, normal affect ENT - no lesions, no post nasal drip, class 2-3 airway Neck: No JVD, no thyromegaly, no carotid bruits Lungs: no use of accessory muscles, no dullness to percussion, decreased without rales or rhonchi  Cardiovascular: Rhythm regular, heart sounds  normal, no murmurs or gallops, no peripheral edema Abdomen: soft and non-tender, no hepatosplenomegaly, BS normal. Musculoskeletal: No deformities, no cyanosis or clubbing Neuro:  alert, non focal, no tremors       Assessment & Plan:

## 2015-04-09 NOTE — Patient Instructions (Signed)
You have sarcoidosis affecting your lungs, lymph nodes and gall bladder Start prednisone (10 mg tabs) - 20 mg daily  Eye appt Breathing test

## 2015-04-09 NOTE — Assessment & Plan Note (Signed)
You have sarcoidosis affecting your lungs, lymph nodes and gall bladder Start prednisone (10 mg tabs) - 20 mg daily  Eye appt Breathing test

## 2015-04-12 LAB — ALDOSTERONE + RENIN ACTIVITY W/ RATIO
ALDO / PRA RATIO: 3.4 ratio (ref 0.9–28.9)
Aldosterone: 12 ng/dL
PRA LC/MS/MS: 3.58 ng/mL/h (ref 0.25–5.82)

## 2015-04-16 ENCOUNTER — Telehealth: Payer: Self-pay | Admitting: Pulmonary Disease

## 2015-04-16 ENCOUNTER — Telehealth: Payer: Self-pay | Admitting: Family

## 2015-04-16 DIAGNOSIS — E876 Hypokalemia: Secondary | ICD-10-CM

## 2015-04-16 NOTE — Telephone Encounter (Signed)
LMTCB

## 2015-04-16 NOTE — Telephone Encounter (Signed)
Please let pt know that I reviewed her recent lab work. It shows that she is excreting a lot of potassium through the urine. I would like to set her up with nephrology for further evaluation.

## 2015-04-17 NOTE — Telephone Encounter (Signed)
Patient informed, understood & agreed; Ok to move forward with Referral, patient request specialist in [place]

## 2015-04-17 NOTE — Telephone Encounter (Signed)
Patient broke out in rash yesterday after eating shrimp.  She took benadryl.  She is feeling better today.  She said she is a little sore where the rash was, but she is feeling better. Nothing further needed.

## 2015-04-22 NOTE — Telephone Encounter (Signed)
pls contact pt re: unread message. 

## 2015-04-22 NOTE — Telephone Encounter (Signed)
Mailed letter °

## 2015-04-23 ENCOUNTER — Encounter: Payer: Self-pay | Admitting: Family

## 2015-04-28 ENCOUNTER — Encounter: Payer: Self-pay | Admitting: Physician Assistant

## 2015-04-28 ENCOUNTER — Ambulatory Visit (INDEPENDENT_AMBULATORY_CARE_PROVIDER_SITE_OTHER): Payer: 59 | Admitting: Physician Assistant

## 2015-04-28 VITALS — BP 110/72 | HR 86 | Temp 98.0°F | Wt 227.0 lb

## 2015-04-28 DIAGNOSIS — B370 Candidal stomatitis: Secondary | ICD-10-CM | POA: Insufficient documentation

## 2015-04-28 DIAGNOSIS — K141 Geographic tongue: Secondary | ICD-10-CM

## 2015-04-28 MED ORDER — TRIAMCINOLONE ACETONIDE 0.1 % MT PSTE: 1.0000 "application " | PASTE | Freq: Two times a day (BID) | OROMUCOSAL | Status: DC

## 2015-04-28 MED ORDER — NYSTATIN 100000 UNIT/ML MT SUSP: 5.0000 mL | Freq: Four times a day (QID) | OROMUCOSAL | Status: DC

## 2015-04-28 NOTE — Assessment & Plan Note (Signed)
Rx Triamcinolone dental paste to use as directed. Supportive measures and reassurance given.

## 2015-04-28 NOTE — Progress Notes (Signed)
Patient presents to clinic today c/o burning sensation of her tongue x 2 days.  Endorses fissures in tongue with redness.  Also endorses white patches of tongue she has been able to scrape off  Denies fever, chills or oral trauma. Is currently on prednisone prescribed by her Pulmonologist for history of sarcoidosis.  Past Medical History  Diagnosis Date  . Thyroid disease   . Hypertension   . Gallstones     Current Outpatient Prescriptions on File Prior to Visit  Medication Sig Dispense Refill  . levothyroxine (SYNTHROID, LEVOTHROID) 125 MCG tablet Take 1 tablet (125 mcg total) by mouth daily.  0  . potassium chloride 20 MEQ/15ML (10%) SOLN Take 15 mLs (20 mEq total) by mouth daily. 473 mL 0  . predniSONE (DELTASONE) 10 MG tablet Take 1 tablet (10 mg total) by mouth 2 (two) times daily with a meal. 60 tablet 0   No current facility-administered medications on file prior to visit.    Allergies  Allergen Reactions  . Morphine And Related Itching  . Percocet [Oxycodone-Acetaminophen]     itching    Family History  Problem Relation Age of Onset  . Hypertension Mother   . Diabetes Father   . Hypertension Father   . Heart attack Neg Hx   . Cancer Maternal Grandmother     breast  . Cancer Maternal Grandfather     prostate    History   Social History  . Marital Status: Single    Spouse Name: N/A  . Number of Children: N/A  . Years of Education: N/A   Social History Main Topics  . Smoking status: Never Smoker   . Smokeless tobacco: Not on file  . Alcohol Use: No  . Drug Use: No  . Sexual Activity: Not on file   Other Topics Concern  . None   Social History Narrative   She works as a Quarry manager at [employer]   3 children- all live locally, she has 3 grand children.   66- son   50- son   21- son   Completed 12th grade   Enjoys church, spending time with grand children.       Review of Systems - See HPI.  All other ROS are negative.  BP  110/72 mmHg  Pulse 86  Temp(Src) 98 F (36.7 C)  Wt 227 lb (102.967 kg)  SpO2 96%  Physical Exam  Constitutional: She is well-developed, well-nourished, and in no distress.  HENT:  Head: Normocephalic and atraumatic.  Nose: Nose normal.  Mouth/Throat:    Eyes: Conjunctivae are normal.  Neck: Neck supple.  Cardiovascular: Normal rate, regular rhythm, normal heart sounds and intact distal pulses.   Skin: Skin is warm and dry. No rash noted.  Vitals reviewed.   Recent Results (from the past 2160 hour(s))  Basic metabolic panel     Status: Abnormal   Collection Time: 02/18/15 11:58 AM  Result Value Ref Range   Sodium 136 135 - 145 mEq/L   Potassium 3.3 (L) 3.5 - 5.1 mEq/L   Chloride 99 96 - 112 mEq/L   CO2 30 19 - 32 mEq/L   Glucose, Bld 100 (H) 70 - 99 mg/dL   BUN 9 6 - 23 mg/dL   Creatinine, Ser 0.73 0.40 - 1.20 mg/dL   Calcium 9.8 8.4 - 10.5 mg/dL   GFR 111.43 >60.00 mL/min  TSH     Status: None   Collection Time: 02/18/15 11:58 AM  Result Value Ref Range   TSH 1.08 0.35 - 4.50 uIU/mL  Basic metabolic panel     Status: Abnormal   Collection Time: 03/19/15 11:07 AM  Result Value Ref Range   Sodium 133 (L) 135 - 145 mEq/L   Potassium 3.2 (L) 3.5 - 5.1 mEq/L   Chloride 95 (L) 96 - 112 mEq/L   CO2 29 19 - 32 mEq/L   Glucose, Bld 125 (H) 70 - 99 mg/dL   BUN 7 6 - 23 mg/dL   Creatinine, Ser 0.84 0.40 - 1.20 mg/dL   Calcium 9.9 8.4 - 10.5 mg/dL   GFR 94.73 >60.00 mL/min  CBC w/Diff     Status: Abnormal   Collection Time: 03/19/15 11:07 AM  Result Value Ref Range   WBC 7.6 4.0 - 10.5 K/uL   RBC 4.90 3.87 - 5.11 Mil/uL   Hemoglobin 12.5 12.0 - 15.0 g/dL   HCT 37.6 36.0 - 46.0 %   MCV 76.7 (L) 78.0 - 100.0 fl   MCHC 33.2 30.0 - 36.0 g/dL   RDW 18.6 (H) 11.5 - 15.5 %   Platelets 450.0 (H) 150.0 - 400.0 K/uL   Neutrophils Relative % 66.5 43.0 - 77.0 %   Lymphocytes Relative 20.9 12.0 - 46.0 %   Monocytes Relative 10.3 3.0 - 12.0 %   Eosinophils Relative 1.8 0.0 -  5.0 %   Basophils Relative 0.5 0.0 - 3.0 %   Neutro Abs 5.1 1.4 - 7.7 K/uL   Lymphs Abs 1.6 0.7 - 4.0 K/uL   Monocytes Absolute 0.8 0.1 - 1.0 K/uL   Eosinophils Absolute 0.1 0.0 - 0.7 K/uL   Basophils Absolute 0.0 0.0 - 0.1 K/uL  Basic metabolic panel     Status: Abnormal   Collection Time: 03/31/15  8:34 AM  Result Value Ref Range   Sodium 140 135 - 145 mEq/L   Potassium 3.1 (L) 3.5 - 5.1 mEq/L   Chloride 104 96 - 112 mEq/L   CO2 29 19 - 32 mEq/L   Glucose, Bld 97 70 - 99 mg/dL   BUN 7 6 - 23 mg/dL   Creatinine, Ser 0.68 0.40 - 1.20 mg/dL   Calcium 9.2 8.4 - 10.5 mg/dL   GFR 120.88 >60.00 mL/min  Basic metabolic panel     Status: Abnormal   Collection Time: 04/07/15 10:15 AM  Result Value Ref Range   Sodium 135 135 - 145 mEq/L   Potassium 3.5 3.5 - 5.1 mEq/L   Chloride 99 96 - 112 mEq/L   CO2 31 19 - 32 mEq/L   Glucose, Bld 104 (H) 70 - 99 mg/dL   BUN 6 6 - 23 mg/dL   Creatinine, Ser 0.74 0.40 - 1.20 mg/dL   Calcium 9.6 8.4 - 10.5 mg/dL   GFR 109.63 >60.00 mL/min  Aldosterone + renin activity w/ ratio     Status: None   Collection Time: 04/07/15 10:15 AM  Result Value Ref Range   PRA LC/MS/MS 3.58 0.25 - 5.82 ng/mL/h   ALDO / PRA Ratio 3.4 0.9 - 28.9 Ratio   Aldosterone 12 ng/dL    Comment:      Adult Reference Ranges for Aldosterone,    LC/MS/MS:       Upright 8:00-10:00 am    < or = 28 ng/dL     Upright 4:00-6:00 pm     < or = 21 ng/dL     Supine  8:00-10:00 am    3-16 ng/dL   Potassium, urine, random  Status: None   Collection Time: 04/07/15 10:15 AM  Result Value Ref Range   Potassium Urine Timed 44 mEq/L    Assessment/Plan: Oral thrush Rx Nystatin mouthwash QID. Hygiene measures discussed.  Will treat pain from geographic tongue with steroid paste (see a/p).  Follow-up if not resolving.   Geographic tongue Rx Triamcinolone dental paste to use as directed. Supportive measures and reassurance given.

## 2015-04-28 NOTE — Patient Instructions (Signed)
Please use the Nystatin mouthwash as directed to heal oral yeast infection. Use a soft-brisled toothbrush to clean teeth, gums and tongue.  Use the triamcinolone past given to help with burning from geographic tongue. Follow-up if symptoms are not resolving.  Thrush, Adult  Ritta Slot, also called oral candidiasis, is a fungal infection that develops in the mouth and throat and on the tongue. It causes white patches to form on the mouth and tongue. Ritta Slot is most common in older adults, but it can occur at any age.  Many cases of thrush are mild, but this infection can also be more serious. Ritta Slot can be a recurring problem for people who have chronic illnesses or who take medicines that limit the body's ability to fight infection. Because these people have difficulty fighting infections, the fungus that causes thrush can spread throughout the body. This can cause life-threatening blood or organ infections. CAUSES  Ritta Slot is usually caused by a yeast called Candida albicans. This fungus is normally present in small amounts in the mouth and on other mucous membranes. It usually causes no harm. However, when conditions are present that allow the fungus to grow uncontrolled, it invades surrounding tissues and becomes an infection. Less often, other Candida species can also lead to thrush.  RISK FACTORS Ritta Slot is more likely to develop in the following people:  People with an impaired ability to fight infection (weakened immune system).   Older adults.   People with HIV.   People with diabetes.   People with dry mouth (xerostomia).   Pregnant women.   People with poor dental care, especially those who have false teeth.   People who use antibiotic medicines.  SIGNS AND SYMPTOMS  Ritta Slot can be a mild infection that causes no symptoms. If symptoms develop, they may include:   A burning feeling in the mouth and throat. This can occur at the start of a thrush infection.   White patches  that adhere to the mouth and tongue. The tissue around the patches may be red, raw, and painful. If rubbed (during tooth brushing, for example), the patches and the tissue of the mouth may bleed easily.   A bad taste in the mouth or difficulty tasting foods.   Cottony feeling in the mouth.   Pain during eating and swallowing. DIAGNOSIS  Your health care provider can usually diagnose thrush by looking in your mouth and asking you questions about your health.  TREATMENT  Medicines that help prevent the growth of fungi (antifungals) are the standard treatment for thrush. These medicines are either applied directly to the affected area (topical) or swallowed (oral). The treatment will depend on the severity of the condition.  Mild Thrush Mild cases of thrush may clear up with the use of an antifungal mouth rinse or lozenges. Treatment usually lasts about 14 days.  Moderate to Severe Thrush  More severe thrush infections that have spread to the esophagus are treated with an oral antifungal medicine. A topical antifungal medicine may also be used.   For some severe infections, a treatment period longer than 14 days may be needed.   Oral antifungal medicines are almost never used during pregnancy because the fetus may be harmed. However, if a pregnant woman has a rare, severe thrush infection that has spread to her blood, oral antifungal medicines may be used. In this case, the risk of harm to the mother and fetus from the severe thrush infection may be greater than the risk posed by the use of  antifungal medicines.  Persistent or Recurrent Thrush For cases of thrush that do not go away or keep coming back, treatment may involve the following:   Treatment may be needed twice as long as the symptoms last.   Treatment will include both oral and topical antifungal medicines.   People with weakened immune systems can take an antifungal medicine on a continuous basis to prevent thrush  infections.  It is important to treat conditions that make you more likely to get thrush, such as diabetes or HIV.  HOME CARE INSTRUCTIONS   Only take over-the-counter or prescription medicine as directed by your health care provider. Talk to your health care provider about an over-the-counter medicine called gentian violet, which kills bacteria and fungi.   Eat plain, unflavored yogurt as directed by your health care provider. Check the label to make sure the yogurt contains live cultures. This yogurt can help healthy bacteria grow in the mouth that can stop the growth of the fungus that causes thrush.   Try these measures to help reduce the discomfort of thrush:   Drink cold liquids such as water or iced tea.   Try flavored ice treats or frozen juices.   Eat foods that are easy to swallow, such as gelatin, ice cream, or custard.   If the patches in your mouth are painful, try drinking from a straw.   Rinse your mouth several times a day with a warm saltwater rinse. You can make the saltwater mixture with 1 tsp (6 g) of salt in 8 fl oz (0.2 L) of warm water.   If you wear dentures, remove the dentures before going to bed, brush them vigorously, and soak them in a cleaning solution as directed by your health care provider.   Women who are breastfeeding should clean their nipples with an antifungal medicine as directed by their health care provider. Dry the nipples after breastfeeding. Applying lanolin-containing body lotion may help relieve nipple soreness.  SEEK MEDICAL CARE IF:  Your symptoms are getting worse or are not improving within 7 days of starting treatment.   You have symptoms of spreading infection, such as white patches on the skin outside of the mouth.   You are nursing and you have redness, burning, or pain in the nipples that is not relieved with treatment.  MAKE SURE YOU:  Understand these instructions.  Will watch your condition.  Will get help  right away if you are not doing well or get worse. Document Released: 09/06/2004 Document Revised: 10/02/2013 Document Reviewed: 07/15/2013 [facility] Patient Information 2015 [place], [place]. This information is not intended to replace advice given to you by your health care provider. Make sure you discuss any questions you have with your health care provider.

## 2015-04-28 NOTE — Progress Notes (Signed)
Pre visit review using our clinic review tool, if applicable. No additional management support is needed unless otherwise documented below in the visit note. 

## 2015-04-28 NOTE — Assessment & Plan Note (Signed)
Rx Nystatin mouthwash QID. Hygiene measures discussed.  Will treat pain from geographic tongue with steroid paste (see a/p).  Follow-up if not resolving.

## 2015-04-29 ENCOUNTER — Telehealth: Payer: Self-pay | Admitting: *Deleted

## 2015-04-29 NOTE — Telephone Encounter (Signed)
-----   Message from Rigoberto Noel, MD sent at 04/29/2015 10:19 AM EDT ----- Can you bring her in tomorrow instead of 5/25 ?  RA ----- Message -----    From: Debbrah Alar, NP    Sent: 04/06/2015   6:28 PM      To: Rigoberto Noel, MD  Elizabeth Riggs,  This pt has pretty severe cough, findings concerning for sarcoid.  She is scheduled to see you on 5/25, by chance would you be able to bring her in sooner?  Thanks,  [employer]

## 2015-04-29 NOTE — Telephone Encounter (Signed)
Patient needs OV tomorrow in [facility] office with Dr. Elsworth Soho.  Called several times and left multiple messages requesting call back to schedule appointment.  Awaiting patient's call back.

## 2015-04-30 ENCOUNTER — Ambulatory Visit: Payer: 59 | Admitting: Pulmonary Disease

## 2015-04-30 NOTE — Telephone Encounter (Signed)
Called and left another message on patient's voicemail requesting that she confirm appointment before I leave for [facility] office this afternoon.  Will await call back.

## 2015-05-12 ENCOUNTER — Ambulatory Visit (INDEPENDENT_AMBULATORY_CARE_PROVIDER_SITE_OTHER): Payer: 59 | Admitting: Pulmonary Disease

## 2015-05-12 DIAGNOSIS — D869 Sarcoidosis, unspecified: Secondary | ICD-10-CM

## 2015-05-12 LAB — PULMONARY FUNCTION TEST
DL/VA % pred: 125 %
DL/VA: 6.32 ml/min/mmHg/L
DLCO UNC % PRED: 84 %
DLCO unc: 22.68 ml/min/mmHg
FEF 25-75 POST: 2.53 L/s
FEF 25-75 PRE: 2.23 L/s
FEF2575-%Change-Post: 13 %
FEF2575-%PRED-PRE: 78 %
FEF2575-%Pred-Post: 89 %
FEV1-%Change-Post: -2 %
FEV1-%PRED-POST: 87 %
FEV1-%Pred-Pre: 89 %
FEV1-POST: 2.3 L
FEV1-PRE: 2.36 L
FEV1FVC-%Change-Post: 1 %
FEV1FVC-%PRED-PRE: 104 %
FEV6-%CHANGE-POST: -4 %
FEV6-%PRED-PRE: 86 %
FEV6-%Pred-Post: 82 %
FEV6-Post: 2.6 L
FEV6-Pre: 2.73 L
FEV6FVC-%PRED-PRE: 102 %
FEV6FVC-%Pred-Post: 102 %
FVC-%CHANGE-POST: -4 %
FVC-%PRED-POST: 80 %
FVC-%Pred-Pre: 84 %
FVC-Post: 2.6 L
FVC-Pre: 2.73 L
POST FEV1/FVC RATIO: 88 %
Post FEV6/FVC ratio: 100 %
Pre FEV1/FVC ratio: 87 %
Pre FEV6/FVC Ratio: 100 %
RV % PRED: 62 %
RV: 1.11 L
TLC % pred: 71 %
TLC: 3.8 L

## 2015-05-12 NOTE — Progress Notes (Signed)
PFT done today. 

## 2015-05-15 ENCOUNTER — Telehealth: Payer: Self-pay | Admitting: Pulmonary Disease

## 2015-05-15 NOTE — Telephone Encounter (Signed)
Spoke with pt and notified of results per Dr. Alva. Pt verbalized understanding and denied any questions. 

## 2015-05-15 NOTE — Telephone Encounter (Signed)
Pt returned call [phone]

## 2015-05-15 NOTE — Telephone Encounter (Signed)
Result Note     Lung function ok     (in regards to her pft)  lmtcb X1 for pt.

## 2015-05-20 ENCOUNTER — Ambulatory Visit (INDEPENDENT_AMBULATORY_CARE_PROVIDER_SITE_OTHER): Payer: 59 | Admitting: Pulmonary Disease

## 2015-05-20 ENCOUNTER — Ambulatory Visit (INDEPENDENT_AMBULATORY_CARE_PROVIDER_SITE_OTHER)
Admission: RE | Admit: 2015-05-20 | Discharge: 2015-05-20 | Disposition: A | Discharge status: Home / Self Care | Payer: 59 | Source: Ambulatory Visit | Attending: Pulmonary Disease | Admitting: Pulmonary Disease

## 2015-05-20 ENCOUNTER — Other Ambulatory Visit (INDEPENDENT_AMBULATORY_CARE_PROVIDER_SITE_OTHER): Payer: 59

## 2015-05-20 ENCOUNTER — Encounter: Payer: Self-pay | Admitting: Pulmonary Disease

## 2015-05-20 VITALS — BP 115/72 | HR 135 | Ht 65.0 in | Wt 230.8 lb

## 2015-05-20 DIAGNOSIS — D869 Sarcoidosis, unspecified: Secondary | ICD-10-CM

## 2015-05-20 LAB — BASIC METABOLIC PANEL
BUN: 7 mg/dL (ref 6–23)
CHLORIDE: 103 meq/L (ref 96–112)
CO2: 28 meq/L (ref 19–32)
CREATININE: 0.66 mg/dL (ref 0.40–1.20)
Calcium: 9.4 mg/dL (ref 8.4–10.5)
GFR: 125.03 mL/min (ref >60.00)
Glucose, Bld: 86 mg/dL (ref 70–99)
POTASSIUM: 3.6 meq/L (ref 3.5–5.1)
SODIUM: 137 meq/L (ref 135–145)

## 2015-05-20 MED ORDER — PREDNISONE 10 MG PO TABS: 10.0000 mg | ORAL_TABLET | Freq: Two times a day (BID) | ORAL | Status: DC

## 2015-05-20 NOTE — Assessment & Plan Note (Addendum)
Lung function is good but symptoms of cough or persistent -hence we'll plan to continue prednisone -she seems to have tolerated this well so far  Stay on prednisone 10 mg twice daily - refill #60 x 3 CXR today Reassess in a month - if better , will decrease pred slowly Take calcium & vit D supplements daily while on prednisone

## 2015-05-20 NOTE — Patient Instructions (Signed)
Stay on prednisone 10 mg twice daily - refill #60 x 3 CXR today Reassess in a month - if better , will decrease pred slowly Take calcium & vit D supplements daily while on prednisone Blood work today

## 2015-05-20 NOTE — Progress Notes (Signed)
   Subjective:    Patient ID: Elizabeth Riggs, female    DOB: 10-Jul-1971, 44 y.o.   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 She presented in 02/2015 with weight loss, intractable nausea and vomiting Had lap chole 03/12/15 and was told that she had a growth on her GB  Pathology showed non-caseating granulomas. She subsequently developed a dry cough, without diurnal variation, denies sinus symptoms or heartburn. She was treated with cough syrup and Tessalon Perles without relief Chest x-ray 12/2014 showed right hilar prominence which was new compared to 05/2014 CT chest 04/01/15 - mediastinal lymphadenopathy-subcarinal and precarinal, scattered subcentimeter nodules CT abdomen from 2010 was reviewed  Labs showed hypokalemia-HCTZ was stopped, calcium levels okay PFTs  -nml  05/20/2015  Chief Complaint  Patient presents with  . Follow-up    Discuss PFT results.  coughing a lot, chest congestion.  Doesn't want to go on oxygen.  Last night, she had a lot of nausea and was vomiting.  Has water weight gain.  Using HCTZ once in a while to help with water.   on 20 mg of prednisone for about a month Working a lot of hours C/o persistent cough Occ nausea & vomiting persists  Chest x-ray-bilateral interstitial infiltrates and hilar lymphadenopathy persists  Review of Systems neg for any significant sore throat, dysphagia, itching, sneezing, nasal congestion or excess/ purulent secretions, fever, chills, sweats, unintended wt loss, pleuritic or exertional cp, hempoptysis, orthopnea pnd or change in chronic leg swelling. Also denies presyncope, palpitations, heartburn, abdominal pain, nausea, vomiting, diarrhea or change in bowel or urinary habits, dysuria,hematuria, rash, arthralgias, visual complaints, headache, numbness weakness or ataxia.     Objective:   Physical Exam  Gen. Pleasant, obese, in no distress ENT - no lesions, no post  nasal drip Neck: No JVD, no thyromegaly, no carotid bruits Lungs: no use of accessory muscles, no dullness to percussion, decreased without rales or rhonchi  Cardiovascular: Rhythm regular, heart sounds  normal, no murmurs or gallops, no peripheral edema Musculoskeletal: No deformities, no cyanosis or clubbing , no tremors       Assessment & Plan:

## 2015-05-20 NOTE — Assessment & Plan Note (Signed)
Rpt BMET She is taking HCTZ  As needed for pedal edema

## 2015-05-21 ENCOUNTER — Institutional Professional Consult (permissible substitution): Payer: 59 | Admitting: Pulmonary Disease

## 2015-06-11 ENCOUNTER — Other Ambulatory Visit: Payer: Self-pay | Admitting: Family

## 2015-06-12 LAB — POTASSIUM, URINE, RANDOM: POTASSIUM UR: 16 meq/L

## 2015-06-18 ENCOUNTER — Ambulatory Visit (INDEPENDENT_AMBULATORY_CARE_PROVIDER_SITE_OTHER): Payer: 59 | Admitting: Adult Health

## 2015-06-18 ENCOUNTER — Encounter: Payer: Self-pay | Admitting: Family Medicine

## 2015-06-18 ENCOUNTER — Encounter: Payer: Self-pay | Admitting: Adult Health

## 2015-06-18 ENCOUNTER — Ambulatory Visit (INDEPENDENT_AMBULATORY_CARE_PROVIDER_SITE_OTHER): Payer: 59 | Admitting: Family Medicine

## 2015-06-18 VITALS — BP 102/69 | Ht 65.0 in | Wt 225.0 lb

## 2015-06-18 VITALS — BP 111/79 | HR 105 | Temp 98.0°F | Ht 65.0 in | Wt 231.0 lb

## 2015-06-18 DIAGNOSIS — M25561 Pain in right knee: Secondary | ICD-10-CM

## 2015-06-18 DIAGNOSIS — M25562 Pain in left knee: Secondary | ICD-10-CM | POA: Diagnosis not present

## 2015-06-18 DIAGNOSIS — D869 Sarcoidosis, unspecified: Secondary | ICD-10-CM

## 2015-06-18 NOTE — Assessment & Plan Note (Signed)
Describes a synovitis though pain has already resolved.  Prior pain attributed to DJD.  Advised regular NSAID for next 7 days, icing.  F/u prn.  Consider injections if this worsens.

## 2015-06-18 NOTE — Progress Notes (Signed)
Subjective:    Patient ID: Elizabeth Riggs, female    DOB: January 13, 1971, 44 y.o.   MRN: [id number]  Knee Pain   Leg Pain    44 yo F here for f/u bilateral knee pain  5/14: Patient reports no known injury. States began having pain behind left knee about 2 weeks ago. Felt like she had a cramp in her leg behind knee that has not gone away. Hard to put pressure on this leg 2/2 pain here. Has not increased activity level prior to pain. Pain is worse with bending and prolonged standing. No prior left knee injuries or surgeries. Feels stiff. Went to [facility] ED - had doppler u/s that was negative for DVT (were able to confirm by obtaining records).  Did note there were 2 possible baker's cysts in this knee though. Given prescription for percocet.  6/4: Patient has been going to PT for popliteal strain/spasm and states helps some while there but massage hurts a lot when they do this Mobic helps only for about an hour Knee swelling and now having medial left knee pain as well. Going to start 12 hour shifts at work - pain worse with prolonged standing, walking Walking with a limp. Still has stiffness within knee as well. No true catching or locking.  6/18: Patient is significantly improved after cortisone injection, no pain currently. 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 No longer icing or taking mobic. Finished with PT. Ready to go back to work full time.  8/2: Patient did extremely well until about 2 weeks ago when pain recurred. Pain up to an 8/10 and worse when sitting for prolonged period then goes to get up. Pain anterior and medial with associated swelling. No mechanical symptoms. Is icing Not taking any aleve, mobic, or other medicines  09/26/11: Patient reports last injection only helped for about a week. Pain is about the same - anteromedial with swelling. No catching, locking. Feels  unstable at times. Taking ibuprofen - has tried aleve, mobic in past. Had two cortisone injections (first lasted for about 2 months). Also did PT.  03/20/13: Patient had arthroscopy of left knee with good relief of her pain. Then states has been limping more over past several weeks to months. Recalls about a week ago was worsened more when she slipped and fell in [place] but she's unable to tell me exactly how she fell down. Taking naproxen which helps some. Pain throughout left knee but mostly posterior. Not doing anything for pain including exercises, icing, medication. Works in transportation now.  08/05/13: Patient returns now with bilateral knee pain. Reports she's not currently taking any medicine for pain. Had been taking aleve at nighttime. Walks a lot at work. Describes aching, stiffness. Difficulty sleeping due to pain. Will wake her up in early morning. Does not want repeat injections.  06/18/15: Patient reports she was doing well until about 3 days ago when knees started hurting without a known injury or increase in activity level. Was really bad yesterday but seems to have improved today. No catching, locking, giving out.  Past Medical History  Diagnosis Date  . Thyroid disease   . Hypertension   . Gallstones     Current Outpatient Prescriptions on File Prior to Visit  Medication Sig Dispense Refill  . hydrochlorothiazide (HYDRODIURIL) 25 MG tablet Take 25 mg by mouth daily.    Marland Kitchen levothyroxine (  SYNTHROID, LEVOTHROID) 125 MCG tablet Take 1 tablet (125 mcg total) by mouth daily.  0  . potassium chloride 20 MEQ/15ML (10%) SOLN Take 15 mLs (20 mEq total) by mouth daily. (Patient not taking: Reported on 06/18/2015) 473 mL 0  . predniSONE (DELTASONE) 10 MG tablet Take 1 tablet (10 mg total) by mouth 2 (two) times daily with a meal. 60 tablet 0  . spironolactone (ALDACTONE) 50 MG tablet Take 50 mg by mouth daily.    Marland Kitchen triamcinolone (KENALOG) 0.1 % paste Use as  directed 1 application in the mouth or throat 2 (two) times daily. 5 g 12   No current facility-administered medications on file prior to visit.    Past Surgical History  Procedure Laterality Date  . Right knee surgery    . Abdominal hysterectomy    . Cholecystectomy  03/12/15    [facility] in [place]    Allergies  Allergen Reactions  . Morphine And Related Itching  . Percocet [Oxycodone-Acetaminophen]     itching    History   Social History  . Marital Status: Single    Spouse Name: N/A  . Number of Children: N/A  . Years of Education: N/A   Occupational History  . Not on file.   Social History Main Topics  . Smoking status: Never Smoker   . Smokeless tobacco: Not on file  . Alcohol Use: No  . Drug Use: No  . Sexual Activity: Not on file   Other Topics Concern  . Not on file   Social History Narrative   She works as a Quarry manager at [employer]   3 children- all live locally, she has 3 grand children.   25- son   1- son   95- son   Completed 12th grade   Enjoys church, spending time with grand children.       Family History  Problem Relation Age of Onset  . Hypertension Mother   . Diabetes Father   . Hypertension Father   . Heart attack Neg Hx   . Cancer Maternal Grandmother     breast  . Cancer Maternal Grandfather     prostate    BP 102/69 mmHg  Ht 5\' 5"  (1.651 m)  Wt 225 lb (102.059 kg)  BMI 37.44 kg/m2  Review of Systems See HPI above.    Objective:   Physical Exam Gen: NAD  Bilateral knees: No gross deformity, effusion, bruising. No tenderness FROM. Stable to valgus/varus stress.  Negative ant/post drawers.  Negative lachmanns Negative mcmurrays, apleys.  Negative apprehension. NVI distally.    Assessment & Plan:  1. Bilateral knee pain - Describes a synovitis though pain has already resolved.  Prior pain attributed to DJD.  Advised regular NSAID for next 7 days, icing.  F/u prn.  Consider  injections if this worsens.

## 2015-06-18 NOTE — Patient Instructions (Signed)
You have synovitis of your knees. I would take aleve 2 tabs twice a day with food for pain and inflammation for 7 days regularly then as needed. Icing is usually better to use but if heat feels better you can do this instead. If this flares up badly come back and see me and we can do cortisone injections. Otherwise follow up with me as needed.

## 2015-06-18 NOTE — Patient Instructions (Signed)
Decrease Prednisone 20mg  and 10mg  daily for 1 week then hold at 10mg  daily  Delsym 2 tsp Twice daily  As needed  Cough .  Claritin 10mg  daily for drainage As needed   follow up Dr. Elsworth Soho  In 1 month and As needed   Please contact office for sooner follow up if symptoms do not improve or worsen or seek emergency care

## 2015-06-18 NOTE — Assessment & Plan Note (Signed)
Sarcoidosis with pulmonary, skin and gallbladder involvement Chest x-ray last month showed stable adenopathy and interstitial changes Recent pulmonary function test showed preserved lung capacity. We will attempt to slowly go down on prednisone and hold at 10 mg until seen back in the office. Patient's continue to follow with ophthalmology.  Chart review showed normal LFTs earlier this year.  PLAN Decrease Prednisone 20mg  and 10mg  daily for 1 week then hold at 10mg  daily  Delsym 2 tsp Twice daily  As needed  Cough .  Claritin 10mg  daily for drainage As needed   follow up Dr. Elsworth Soho  In 1 month and As needed   Please contact office for sooner follow up if symptoms do not improve or worsen or seek emergency care

## 2015-06-18 NOTE — Progress Notes (Signed)
   Subjective:    Patient ID: Elizabeth Riggs, female    DOB: 1971-06-13, 44 y.o.   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 She presented in 02/2015 with weight loss, intractable nausea and vomiting Had lap chole 03/12/15 and was told that she had a growth on her GB  Pathology showed non-caseating granulomas. She subsequently developed a dry cough, without diurnal variation, denies sinus symptoms or heartburn. She was treated with cough syrup and Tessalon Perles without relief Chest x-ray 12/2014 showed right hilar prominence which was new compared to 05/2014 CT chest 04/01/15 - mediastinal lymphadenopathy-subcarinal and precarinal, scattered subcentimeter nodules CT abdomen from 2010 was reviewed  Labs showed hypokalemia-HCTZ was stopped, calcium levels okay PFTs  -nml  05/20/2015  Chief Complaint  Patient presents with  . Follow-up    Discuss PFT results.  coughing a lot, chest congestion.  Doesn't want to go on oxygen.  Last night, she had a lot of nausea and was vomiting.  Has water weight gain.  Using HCTZ once in a while to help with water.   on 20 mg of prednisone for about a month Working a lot of hours C/o persistent cough Occ nausea & vomiting persists  Chest x-ray-bilateral interstitial infiltrates and hilar lymphadenopathy persists  06/18/2015 Follow up : Elizabeth Riggs /skin involvement  Patient returns for a one-month follow-up for sarcoid. Patient remains on prednisone 10 mg twice daily. Chest x-ray last visit shows mild stable bilateral hilar fullness, foot numbness along with diffuse bilateral interstitial prominence consistent with known sarcoid. She says overall her cough is slightly better but has not resolved. She has chest wall soreness along the anterior chest wall.  chest is sore to touch . Denies any new rash, redness, hemoptysis, orthopnea, PND or increased leg swelling. Says that she has been having  more fluid retention especially in the hands and ankles. Has been having knee pain as well.  She does have some lesions that have been present along her nose that have not changed .chart review shows  Previous LFT were normal  Patient has been seen by the eye doctor. Does have drainage and nasal congestion.    Review of Systems neg for any significant sore throat, dysphagia, itching, sneezing, nasal congestion or excess/ purulent secretions, fever, chills, sweats, unintended wt loss, pleuritic or exertional cp, hempoptysis, orthopnea pnd or change in chronic leg swelling. Also denies presyncope, palpitations, heartburn, abdominal pain, nausea, vomiting, diarrhea or change in bowel or urinary habits, dysuria,hematuria, rash, arthralgias, visual complaints, headache, numbness weakness or ataxia.     Objective:   Physical Exam  Gen. Pleasant, obese, in no distress ENT - no lesions, no post nasal drip Neck: No JVD, no thyromegaly, no carotid bruits Lungs: no use of accessory muscles, no dullness to percussion, decreased without rales or rhonchi  Cardiovascular: Rhythm regular, heart sounds  normal, no murmurs or gallops, no peripheral edema Musculoskeletal: No deformities, no cyanosis or clubbing , no tremors Skin : lesions along tip of nose and med cheek/nose       Assessment & Plan:

## 2015-06-22 NOTE — Progress Notes (Signed)
Reviewed & agree with plan  

## 2015-07-02 ENCOUNTER — Ambulatory Visit (HOSPITAL_BASED_OUTPATIENT_CLINIC_OR_DEPARTMENT_OTHER)
Admission: RE | Admit: 2015-07-02 | Discharge: 2015-07-02 | Disposition: A | Discharge status: Home / Self Care | Payer: 59 | Source: Ambulatory Visit | Attending: Adult Health | Admitting: Adult Health

## 2015-07-02 ENCOUNTER — Encounter: Payer: Self-pay | Admitting: Adult Health

## 2015-07-02 ENCOUNTER — Ambulatory Visit (INDEPENDENT_AMBULATORY_CARE_PROVIDER_SITE_OTHER): Payer: 59 | Admitting: Adult Health

## 2015-07-02 VITALS — BP 107/73 | HR 95 | Ht 65.0 in | Wt 233.0 lb

## 2015-07-02 DIAGNOSIS — J209 Acute bronchitis, unspecified: Secondary | ICD-10-CM | POA: Diagnosis not present

## 2015-07-02 DIAGNOSIS — R059 Cough, unspecified: Secondary | ICD-10-CM

## 2015-07-02 DIAGNOSIS — R05 Cough: Secondary | ICD-10-CM

## 2015-07-02 DIAGNOSIS — R938 Abnormal findings on diagnostic imaging of other specified body structures: Secondary | ICD-10-CM | POA: Insufficient documentation

## 2015-07-02 DIAGNOSIS — D869 Sarcoidosis, unspecified: Secondary | ICD-10-CM

## 2015-07-02 MED ORDER — LEVALBUTEROL HCL 0.63 MG/3ML IN NEBU
0.6300 mg | INHALATION_SOLUTION | Freq: Once | RESPIRATORY_TRACT | Status: AC
  Administered 2015-07-02: 0.63 mg via RESPIRATORY_TRACT

## 2015-07-02 MED ORDER — AZITHROMYCIN 250 MG PO TABS: ORAL_TABLET | ORAL | Status: AC

## 2015-07-02 NOTE — Patient Instructions (Addendum)
Zpack take as directed.  Mucinex DM Twice daily  As needed  Cough /congestion .  Increase Prednisone 10mg  2 tabs in am  Begin Dulera 181mcg 2 puffs Twice daily  , rinse after use .  follow up Dr. Elsworth Soho  In 2 weeks with chest xray

## 2015-07-02 NOTE — Progress Notes (Signed)
Subjective:    Patient ID: Elizabeth Riggs, female    DOB: Jul 21, 1971, 44 y.o.   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 She presented in 02/2015 with weight loss, intractable nausea and vomiting Had lap chole 03/12/15 and was told that she had a growth on her GB  Pathology showed non-caseating granulomas. She subsequently developed a dry cough, without diurnal variation, denies sinus symptoms or heartburn. She was treated with cough syrup and Tessalon Perles without relief Chest x-ray 12/2014 showed right hilar prominence which was new compared to 05/2014 CT chest 04/01/15 - mediastinal lymphadenopathy-subcarinal and precarinal, scattered subcentimeter nodules CT abdomen from 2010 was reviewed  Labs showed hypokalemia-HCTZ was stopped, calcium levels okay PFTs  -nml  05/20/2015  Chief Complaint  Patient presents with  . Follow-up    Discuss PFT results.  coughing a lot, chest congestion.  Doesn't want to go on oxygen.  Last night, she had a lot of nausea and was vomiting.  Has water weight gain.  Using HCTZ once in a while to help with water.   on 20 mg of prednisone for about a month Working a lot of hours C/o persistent cough Occ nausea & vomiting persists  Chest x-ray-bilateral interstitial infiltrates and hilar lymphadenopathy persists  06/18/15  Follow up : Elizabeth Riggs /skin involvement  Patient returns for a one-month follow-up for sarcoid. Patient remains on prednisone 10 mg twice daily. Chest x-ray last visit shows mild stable bilateral hilar fullness, foot numbness along with diffuse bilateral interstitial prominence consistent with known sarcoid. She says overall her cough is slightly better but has not resolved. She has chest wall soreness along the anterior chest wall.  chest is sore to touch . Denies any new rash, redness, hemoptysis, orthopnea, PND or increased leg swelling. Says that she has been having  more fluid retention especially in the hands and ankles. Has been having knee pain as well.  She does have some lesions that have been present along her nose that have not changed .chart review shows  Previous LFT were normal  Patient has been seen by the eye doctor. Does have drainage and nasal congestion.  07/02/2015 Acute OV : Sarcoid  Pt returns with worsening breathing.  Patient feels like there is fluid building up.,  Seen 2 weeks , prednisone decreased 10mg  daily   Feels cough is worse.  No fever, chest pain, hemoptysis or edema.  CXR today shows sarcoid changes with increaesed interstitial markings.  Does have some congestion on/off .  Still have GI issues with digestion. discusssed follow up with GI.    Review of System  neg for any significant sore throat, dysphagia, itching, sneezing, nasal congestion or excess/ purulent secretions, fever, chills, sweats, unintended wt loss, pleuritic or exertional cp, hempoptysis, orthopnea pnd or change in chronic leg swelling. Also denies presyncope, palpitations, heartburn, abdominal pain, nausea, vomiting, diarrhea or change in bowel or urinary habits, dysuria,hematuria, rash, arthralgias, visual complaints, headache, numbness weakness or ataxia.     Objective:   Physical Exam  Gen. Pleasant, obese, in no distress ENT - no lesions, no post nasal drip Neck: No JVD, no thyromegaly, no carotid bruits Lungs: no use of accessory muscles, no dullness to percussion, decreased without rales or rhonchi  Cardiovascular: Rhythm regular, heart sounds  normal, no murmurs or gallops, no peripheral edema Musculoskeletal: No deformities, no cyanosis or clubbing , no tremors Skin : lesions along tip of nose and med  cheek/nose   CXR 07/02/15  Again noted mild hilar prominence bilaterally and reticular perihilar interstitial prominence. There is worsening interstitial prominence especially right perihilar and infrahilar region highly suspicious for  atypical interstitial pneumonia or pneumonitis. Central mild bronchitic changes. Clinical correlation is necessary. No convincing pulmonary edema.  Reviewed independently and agree     Assessment & Plan:

## 2015-07-06 ENCOUNTER — Encounter: Payer: Self-pay | Admitting: Family

## 2015-07-06 ENCOUNTER — Ambulatory Visit (INDEPENDENT_AMBULATORY_CARE_PROVIDER_SITE_OTHER): Payer: 59 | Admitting: Family

## 2015-07-06 VITALS — BP 100/70 | HR 96 | Temp 98.0°F | Resp 16 | Ht 64.75 in | Wt 231.6 lb

## 2015-07-06 DIAGNOSIS — Z0000 Encounter for general adult medical examination without abnormal findings: Secondary | ICD-10-CM

## 2015-07-06 DIAGNOSIS — B370 Candidal stomatitis: Secondary | ICD-10-CM | POA: Diagnosis not present

## 2015-07-06 DIAGNOSIS — K219 Gastro-esophageal reflux disease without esophagitis: Secondary | ICD-10-CM

## 2015-07-06 DIAGNOSIS — D869 Sarcoidosis, unspecified: Secondary | ICD-10-CM | POA: Diagnosis not present

## 2015-07-06 DIAGNOSIS — J209 Acute bronchitis, unspecified: Secondary | ICD-10-CM | POA: Insufficient documentation

## 2015-07-06 MED ORDER — PANTOPRAZOLE SODIUM 40 MG PO TBEC: 40.0000 mg | DELAYED_RELEASE_TABLET | Freq: Every day | ORAL | Status: DC

## 2015-07-06 MED ORDER — NYSTATIN 100000 UNIT/ML MT SUSP: 5.0000 mL | Freq: Four times a day (QID) | OROMUCOSAL | Status: DC

## 2015-07-06 MED ORDER — MOMETASONE FURO-FORMOTEROL FUM 100-5 MCG/ACT IN AERO: 2.0000 | INHALATION_SPRAY | Freq: Two times a day (BID) | RESPIRATORY_TRACT | Status: DC

## 2015-07-06 NOTE — Patient Instructions (Addendum)
Start protonix. Keep your upcoming appointment with GI. Start nystatin for thrush.  Let me know if symptoms don't improve. Gargle after you use your dulera inhaler.  Follow up in 3 months.

## 2015-07-06 NOTE — Assessment & Plan Note (Signed)
?   Flare  Repeat cxr on return   Plan  Zpack take as directed.

## 2015-07-06 NOTE — Assessment & Plan Note (Signed)
?  flare on lower dose of steroids  cxr w/ increase prominence  Add ICS/LABA trial   Plan   Mucinex DM Twice daily  As needed  Cough /congestion .  Increase Prednisone 10mg  2 tabs in am  Begin Dulera 173mcg 2 puffs Twice daily  , rinse after use .  follow up Dr. Elsworth Soho  In 2 weeks with chest xray

## 2015-07-06 NOTE — Progress Notes (Signed)
Pre visit review using our clinic review tool, if applicable. No additional management support is needed unless otherwise documented below in the visit note. 

## 2015-07-06 NOTE — Progress Notes (Signed)
Subjective:    Patient ID: Elizabeth Riggs, female    DOB: 1970-12-31, 44 y.o.   MRN: [id number]  HPI  Elizabeth Riggs is a 44 yr old female who presents today    HTN- she remains off of hctz,  Though notes that she has some LE edema. BP Readings from Last 3 Encounters:  07/06/15 100/70  07/02/15 107/73  06/18/15 111/79   Sarcoidosis/cough- reports + pleuritic chest pain with cough.  She is being maintained on prednisone and last saw pulmonary on 07/02/15.    She reports that when she eats she can't lay down. Has gerd symptoms.  She sees Dr. Norval Riggs for GI and has an upcoming apt.    Sees renal doctor on Friday. Reports that she was told to start aldactone.   Potassium was d/c'd. She will repeat blood work with her on Friday. She was seen by Dr. Ruby Riggs.      Review of Systems    see HPI  Past Medical History  Diagnosis Date  . Thyroid disease   . Hypertension   . Gallstones     History   Social History  . Marital Status: Single    Spouse Name: N/A  . Number of Children: N/A  . Years of Education: N/A   Occupational History  . Not on file.   Social History Main Topics  . Smoking status: Never Smoker   . Smokeless tobacco: Not on file  . Alcohol Use: No  . Drug Use: No  . Sexual Activity: Not on file   Other Topics Concern  . Not on file   Social History Narrative   She works as a Quarry manager at [employer]   3 children- all live locally, she has 3 grand children.   41- son   44- son   24- son   Completed 12th grade   Enjoys church, spending time with grand children.       Past Surgical History  Procedure Laterality Date  . Right knee surgery    . Abdominal hysterectomy    . Cholecystectomy  03/12/15    [facility] in [place]    Family History  Problem Relation Age of Onset  . Hypertension Mother   . Diabetes Father   . Hypertension Father   . Heart attack Neg Hx   . Cancer Maternal Grandmother     breast  . Cancer  Maternal Grandfather     prostate    Allergies  Allergen Reactions  . Morphine And Related Itching  . Percocet [Oxycodone-Acetaminophen]     itching    Current Outpatient Prescriptions on File Prior to Visit  Medication Sig Dispense Refill  . azithromycin (ZITHROMAX Z-PAK) 250 MG tablet Take 2 tablets (500 mg) on  Day 1,  followed by 1 tablet (250 mg) once daily on Days 2 through 5. 6 each 0  . hydrochlorothiazide (HYDRODIURIL) 25 MG tablet Take 25 mg by mouth daily.    Marland Kitchen levothyroxine (SYNTHROID, LEVOTHROID) 125 MCG tablet Take 1 tablet (125 mcg total) by mouth daily.  0  . predniSONE (DELTASONE) 10 MG tablet Take 1 tablet (10 mg total) by mouth 2 (two) times daily with a meal. (Patient taking differently: Take 10 mg by mouth daily with breakfast. ) 60 tablet 0  . spironolactone (ALDACTONE) 50 MG tablet Take 50 mg by mouth daily.    Marland Kitchen triamcinolone (KENALOG) 0.1 % paste Use as directed 1 application in the mouth or throat  2 (two) times daily. 5 g 12   No current facility-administered medications on file prior to visit.    BP 100/70 mmHg  Pulse 96  Temp(Src) 98 F (36.7 C) (Oral)  Resp 16  Ht 5' 4.75" (1.645 m)  Wt 231 lb 9.6 oz (105.053 kg)  BMI 38.82 kg/m2  SpO2 99%    Objective:   Physical Exam  Constitutional: She appears well-developed and well-nourished.  HENT:  + oral thrush  Cardiovascular: Normal rate, regular rhythm and normal heart sounds.   No murmur heard. Pulmonary/Chest: Effort normal and breath sounds normal. No respiratory distress. She has no wheezes.  Psychiatric: She has a normal mood and affect. Her behavior is normal. Judgment and thought content normal.           Assessment & Plan:  Add Nystatin for oral thrush, advised gargle after dulera

## 2015-07-07 NOTE — Progress Notes (Signed)
Reviewed & agree with plan  

## 2015-07-10 DIAGNOSIS — K219 Gastro-esophageal reflux disease without esophagitis: Secondary | ICD-10-CM | POA: Insufficient documentation

## 2015-07-10 NOTE — Assessment & Plan Note (Signed)
New. Trial of PPI, hopefully this will help with cough. Advised pt to keep upcoming GI apt.

## 2015-07-10 NOTE — Assessment & Plan Note (Signed)
Management per pulmonology.

## 2015-07-16 ENCOUNTER — Ambulatory Visit (HOSPITAL_BASED_OUTPATIENT_CLINIC_OR_DEPARTMENT_OTHER)
Admission: RE | Admit: 2015-07-16 | Discharge: 2015-07-16 | Disposition: A | Discharge status: Home / Self Care | Payer: 59 | Source: Ambulatory Visit | Attending: Pulmonary Disease | Admitting: Pulmonary Disease

## 2015-07-16 ENCOUNTER — Other Ambulatory Visit (INDEPENDENT_AMBULATORY_CARE_PROVIDER_SITE_OTHER): Payer: 59

## 2015-07-16 ENCOUNTER — Encounter: Payer: Self-pay | Admitting: Pulmonary Disease

## 2015-07-16 ENCOUNTER — Ambulatory Visit (INDEPENDENT_AMBULATORY_CARE_PROVIDER_SITE_OTHER): Payer: 59 | Admitting: Pulmonary Disease

## 2015-07-16 ENCOUNTER — Ambulatory Visit: Payer: 59 | Admitting: Pulmonary Disease

## 2015-07-16 DIAGNOSIS — D869 Sarcoidosis, unspecified: Secondary | ICD-10-CM

## 2015-07-16 DIAGNOSIS — R918 Other nonspecific abnormal finding of lung field: Secondary | ICD-10-CM | POA: Diagnosis not present

## 2015-07-16 DIAGNOSIS — R590 Localized enlarged lymph nodes: Secondary | ICD-10-CM | POA: Insufficient documentation

## 2015-07-16 MED ORDER — HYDROCORTISONE 1 % EX LOTN: TOPICAL_LOTION | CUTANEOUS | Status: DC

## 2015-07-16 NOTE — Progress Notes (Signed)
   Subjective:    Patient ID: Elizabeth Riggs, female    DOB: 01-25-1971, 44 y.o.   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 She presented in 02/2015 with weight loss, intractable nausea and vomiting Had lap chole >> Pathology showed non-caseating granulomas. She subsequently developed a dry intractable cough  07/16/2015  Chief Complaint  Patient presents with  . Follow-up    CXR today. Sarcoidosis follow up.  Patient feels very sluggish, wants to know if she can get potassium checked. Ruthe Mannan is giving her a headache.   03/2015 started pred 20 mg- tapered to 10 mg with worsening symptoms and hence increased back to 20 mg 06/2015  C/o stomach cramping- has GI follow-up scheduled Violet spots on face & nose her cough is slightly better but has not resolved.  Patient has been seen by the eye doctor. CXR 06/2015 - slightly improved interstitium, compared to priors, lymphadenopathy persists  Significant tests/ events  Chest x-ray 12/2014 showed right hilar prominence which was new compared to 05/2014 CT chest 04/01/15 - mediastinal lymphadenopathy-subcarinal and precarinal, scattered subcentimeter nodules CT abdomen from 2010 was reviewed  Labs showed hypokalemia-HCTZ was stopped, calcium levels okay PFTs 04/2015   -nml  Review of Systems neg for any significant sore throat, dysphagia, itching, sneezing, nasal congestion or excess/ purulent secretions, fever, chills, sweats, unintended wt loss, pleuritic or exertional cp, hempoptysis, orthopnea pnd or change in chronic leg swelling. Also denies presyncope, palpitations, heartburn, abdominal pain, nausea, vomiting, diarrhea or change in bowel or urinary habits, dysuria,hematuria, rash, arthralgias, visual complaints, headache, numbness weakness or ataxia.     Objective:   Physical Exam  Gen. Pleasant, obese, in no distress ENT - no lesions, no post nasal drip Neck: No  JVD, no thyromegaly, no carotid bruits Lungs: no use of accessory muscles, no dullness to percussion, decreased without rales or rhonchi  Cardiovascular: Rhythm regular, heart sounds  normal, no murmurs or gallops, no peripheral edema Musculoskeletal: No deformities, no cyanosis or clubbing , no tremors        Assessment & Plan:

## 2015-07-16 NOTE — Patient Instructions (Signed)
Stay on 20 mg prednisone until next visit 1% hydrocortisone cream for application on face at bedtime Dermatology consultation OK to stay on cough syrup Call us if worse.

## 2015-07-16 NOTE — Assessment & Plan Note (Addendum)
She does appear to have systemic sarcoid with involvement of lungs, lymph node, GI tract, and possibly skin. Although lung function was normal, started on prednisone due to intractable cough Stay on 20 mg prednisone until next visit 1% hydrocortisone cream for application on face at bedtime Dermatology consultation OK to stay on cough syrup Call us if worse.

## 2015-07-16 NOTE — Assessment & Plan Note (Signed)
BMET today

## 2015-07-17 ENCOUNTER — Telehealth: Payer: Self-pay | Admitting: Pulmonary Disease

## 2015-07-17 LAB — BASIC METABOLIC PANEL
BUN: 12 mg/dL (ref 6–23)
CHLORIDE: 98 meq/L (ref 96–112)
CO2: 26 mEq/L (ref 19–32)
Calcium: 9.8 mg/dL (ref 8.4–10.5)
Creat: 0.71 mg/dL (ref 0.50–1.10)
Glucose, Bld: 84 mg/dL (ref 70–99)
Potassium: 3.2 mEq/L — ABNORMAL LOW (ref 3.5–5.3)
SODIUM: 139 meq/L (ref 135–145)

## 2015-07-17 MED ORDER — POTASSIUM CHLORIDE ER 20 MEQ PO TBCR: EXTENDED_RELEASE_TABLET | ORAL | Status: DC

## 2015-07-17 NOTE — Telephone Encounter (Signed)
Potassium level is low    Take 20 mEq daily x 5 days   I spoke with patient about results and she verbalized understanding and had no questions RX sent in. Nothing further needed

## 2015-07-23 ENCOUNTER — Ambulatory Visit: Payer: 59 | Admitting: Pulmonary Disease

## 2015-07-24 ENCOUNTER — Telehealth: Payer: Self-pay | Admitting: Pulmonary Disease

## 2015-07-24 NOTE — Telephone Encounter (Signed)
ATC Brenda and line busy x 2 WCB

## 2015-07-24 NOTE — Telephone Encounter (Deleted)
Closed this by mistake!!!

## 2015-07-27 NOTE — Telephone Encounter (Signed)
Spoke with Hassan Rowan at Dr. Nathanial Millman office  She is requesting last 2 ov notes from [facility]  I have faxed over the notes  Nothing further needed

## 2015-08-17 ENCOUNTER — Ambulatory Visit: Payer: 59 | Admitting: Adult Health

## 2015-08-27 ENCOUNTER — Encounter: Payer: Self-pay | Admitting: Adult Health

## 2015-08-27 ENCOUNTER — Ambulatory Visit (INDEPENDENT_AMBULATORY_CARE_PROVIDER_SITE_OTHER): Payer: 59 | Admitting: Adult Health

## 2015-08-27 VITALS — BP 105/71 | HR 74 | Temp 97.6°F | Ht 67.0 in | Wt 239.0 lb

## 2015-08-27 DIAGNOSIS — I159 Secondary hypertension, unspecified: Secondary | ICD-10-CM | POA: Diagnosis not present

## 2015-08-27 DIAGNOSIS — D869 Sarcoidosis, unspecified: Secondary | ICD-10-CM | POA: Diagnosis not present

## 2015-08-27 NOTE — Addendum Note (Signed)
Addended by: Mathis Dad on: 08/27/2015 11:50 AM   Modules accepted: Orders

## 2015-08-27 NOTE — Progress Notes (Signed)
   Subjective:    Patient ID: Elizabeth Riggs, female    DOB: March 04, 1971, 44 y.o.   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 She presented in 02/2015 with weight loss, intractable nausea and vomiting Had lap chole 03/12/15 and was told that she had a growth on her GB  Pathology showed non-caseating granulomas. She subsequently developed a dry cough, without diurnal variation, denies sinus symptoms or heartburn. She was treated with cough syrup and Tessalon Perles without relief Chest x-ray 12/2014 showed right hilar prominence which was new compared to 05/2014 CT chest 04/01/15 - mediastinal lymphadenopathy-subcarinal and precarinal, scattered subcentimeter nodules CT abdomen from 2010 was reviewed PFTs  -nml      08/27/2015 Acute OV : Sarcoid Pulmonary /skin involvement  Pt returns for follow up for sarcoid .  She has been having more difficulty with cough and skin rash on face . Prednisone was increased 20mg  daily .  Says her breathing is better , slightly improved cough .  Complains that she feels she is retaining fluid.  Seen by nephrologist and started on diuretic.  She says she was told she should get a echo of her heart.  She requests we order this.  Rash on face about the same.  Denies chest pain,orthopnea, fever , hemoptysis .   Previous LFT were normal  Patient has been seen by the eye doctor.  Review of System  neg for any significant sore throat, dysphagia, itching, sneezing, nasal congestion or excess/ purulent secretions, fever, chills, sweats, unintended wt loss, pleuritic or exertional cp, hempoptysis, orthopnea pnd or change in chronic leg swelling. Also denies presyncope, palpitations, heartburn, abdominal pain, nausea, vomiting, diarrhea or change in bowel or urinary habits, dysuria,hematuria, rash, arthralgias, visual complaints, headache, numbness weakness or ataxia.     Objective:   Physical Exam  Gen.  Pleasant, obese, in no distress ENT - no lesions, no post nasal drip Neck: No JVD, no thyromegaly, no carotid bruits Lungs: no use of accessory muscles, no dullness to percussion, decreased without rales or rhonchi  Cardiovascular: Rhythm regular, heart sounds  normal, no murmurs or gallops, no peripheral edema Musculoskeletal: No deformities, no cyanosis or clubbing , no tremors Skin : lesions along tip of nose and mid cheek/nose        Assessment & Plan:

## 2015-08-27 NOTE — Progress Notes (Signed)
Reviewed & agree with plan  

## 2015-08-27 NOTE — Assessment & Plan Note (Addendum)
Sarcoid with Pulmonary and skin involvement  Try to decrease steroids if able as question side effects of prednisone   Plan  We are setting you up for an 2 D echo .  Low salt diet  Decrease Prednisone 20mg  and 10mg  daily for 1 week then hold at 10mg  daily  Delsym 2 tsp Twice daily  As needed  Cough .  Claritin 10mg  daily for drainage As needed   follow up Dr. Elsworth Soho  In 2  month and As needed   Please contact office for sooner follow up if symptoms do not improve or worsen or seek emergency care

## 2015-08-27 NOTE — Assessment & Plan Note (Signed)
Low salt diet  Check echo ? Diastolic dysfunction

## 2015-08-27 NOTE — Patient Instructions (Addendum)
We are setting you up for an 2 D echo .  Low salt diet  Decrease Prednisone 20mg  and 10mg  daily for 1 week then hold at 10mg  daily  Delsym 2 tsp Twice daily  As needed  Cough .  Claritin 10mg  daily for drainage As needed   follow up Dr. Elsworth Soho  In 2  month and As needed   Please contact office for sooner follow up if symptoms do not improve or worsen or seek emergency care

## 2015-09-04 ENCOUNTER — Ambulatory Visit (HOSPITAL_COMMUNITY)
Admission: RE | Admit: 2015-09-04 | Discharge: 2015-09-04 | Disposition: A | Discharge status: Home / Self Care | Payer: 59 | Source: Ambulatory Visit | Attending: Adult Health | Admitting: Adult Health

## 2015-09-04 DIAGNOSIS — I159 Secondary hypertension, unspecified: Secondary | ICD-10-CM | POA: Diagnosis not present

## 2015-09-04 DIAGNOSIS — I340 Nonrheumatic mitral (valve) insufficiency: Secondary | ICD-10-CM | POA: Diagnosis not present

## 2015-09-04 DIAGNOSIS — D869 Sarcoidosis, unspecified: Secondary | ICD-10-CM

## 2015-09-04 DIAGNOSIS — R0600 Dyspnea, unspecified: Secondary | ICD-10-CM | POA: Diagnosis present

## 2015-09-04 DIAGNOSIS — I10 Essential (primary) hypertension: Secondary | ICD-10-CM | POA: Insufficient documentation

## 2015-09-04 NOTE — Progress Notes (Signed)
*  PRELIMINARY RESULTS* Echocardiogram 2D Echocardiogram has been performed.  Elizabeth Riggs 09/04/2015, 3:05 PM

## 2015-09-09 ENCOUNTER — Telehealth: Payer: Self-pay | Admitting: Pulmonary Disease

## 2015-09-09 NOTE — Progress Notes (Signed)
Quick Note:  Pt notified of results and denied any questions/concerns ______

## 2015-09-09 NOTE — Telephone Encounter (Signed)
Notes Recorded by Melvenia Needles, NP on 09/08/2015 at 10:09 PM Echo showed EF /pump function of the heart was normal  Mild LVH c/w HTN  No sign of sarcoid changes in the heart.  Cont w/ ov recs  follow up as planned and As needed  Please contact office for sooner follow up if symptoms do not improve or worsen or seek emergency care   Spoke with pt and notified of results per TP. Pt verbalized understanding and denied any questions.

## 2015-10-05 ENCOUNTER — Ambulatory Visit: Payer: 59 | Admitting: Family

## 2015-10-05 DIAGNOSIS — Z0289 Encounter for other administrative examinations: Secondary | ICD-10-CM

## 2015-10-12 ENCOUNTER — Telehealth: Payer: Self-pay | Admitting: Family

## 2015-10-12 NOTE — Telephone Encounter (Signed)
Yes please

## 2015-10-12 NOTE — Telephone Encounter (Signed)
Pt was no show 10/05/15 3:30pm for follow up appt, pt has not rescheduled, charge or no charge?

## 2015-10-13 ENCOUNTER — Telehealth: Payer: Self-pay | Admitting: Pulmonary Disease

## 2015-10-13 NOTE — Telephone Encounter (Signed)
Left message for pt to call back  °

## 2015-10-14 NOTE — Telephone Encounter (Signed)
Spoke with the pt  She is requesting to have cxr  She states "sarcoid is acting up" Pt c/o CP, occurs only after eating and also having minimal SOB  OV with TP at [facility] 10/15/15 ED or call sooner if needed

## 2015-10-15 ENCOUNTER — Ambulatory Visit: Payer: 59 | Admitting: Adult Health

## 2015-11-26 ENCOUNTER — Encounter: Payer: Self-pay | Admitting: Pulmonary Disease

## 2015-11-26 ENCOUNTER — Ambulatory Visit (INDEPENDENT_AMBULATORY_CARE_PROVIDER_SITE_OTHER): Payer: 59 | Admitting: Pulmonary Disease

## 2015-11-26 ENCOUNTER — Ambulatory Visit (HOSPITAL_BASED_OUTPATIENT_CLINIC_OR_DEPARTMENT_OTHER)
Admission: RE | Admit: 2015-11-26 | Discharge: 2015-11-26 | Disposition: A | Discharge status: Home / Self Care | Payer: 59 | Source: Ambulatory Visit | Attending: Pulmonary Disease | Admitting: Pulmonary Disease

## 2015-11-26 VITALS — BP 101/69 | HR 104 | Ht 65.0 in | Wt 256.0 lb

## 2015-11-26 DIAGNOSIS — D869 Sarcoidosis, unspecified: Secondary | ICD-10-CM | POA: Diagnosis not present

## 2015-11-26 DIAGNOSIS — E876 Hypokalemia: Secondary | ICD-10-CM

## 2015-11-26 DIAGNOSIS — R05 Cough: Secondary | ICD-10-CM | POA: Diagnosis present

## 2015-11-26 LAB — BASIC METABOLIC PANEL
BUN: 13 mg/dL (ref 7–25)
CALCIUM: 9.4 mg/dL (ref 8.6–10.2)
CHLORIDE: 102 mmol/L (ref 98–110)
CO2: 26 mmol/L (ref 20–31)
CREATININE: 0.68 mg/dL (ref 0.50–1.10)
Glucose, Bld: 119 mg/dL — ABNORMAL HIGH (ref 65–99)
Potassium: 3.6 mmol/L (ref 3.5–5.3)
Sodium: 140 mmol/L (ref 135–146)

## 2015-11-26 LAB — HEPATIC FUNCTION PANEL
ALBUMIN: 3.7 g/dL (ref 3.6–5.1)
ALK PHOS: 92 U/L (ref 33–115)
ALT: 11 U/L (ref 6–29)
AST: 12 U/L (ref 10–30)
Bilirubin, Direct: <0.1 mg/dL (ref <=0.2)
TOTAL PROTEIN: 7.7 g/dL (ref 6.1–8.1)
Total Bilirubin: 0.2 mg/dL (ref 0.2–1.2)

## 2015-11-26 NOTE — Assessment & Plan Note (Addendum)
Now hyperK Stop supplements Rpt BMET today-shows potassium of 3.6

## 2015-11-26 NOTE — Assessment & Plan Note (Signed)
CXR & blood work today  If [place] , I will ask you to drop prednisone to -5mg  daily  Then on dec 20th , drop to 5mg  M/W/F On Jan 1, drop to 5mg  M/F On Feb 1st , STOP completely

## 2015-11-26 NOTE — Patient Instructions (Addendum)
CXR & blood work today  If [place] , I will ask you to drop prednisone to -5mg  daily  Then on dec 20th , drop to 5mg  M/W/F On Jan 1, drop to 5mg  M/F On Feb 1st , STOP completely  STOP taking potassium & lasix

## 2015-11-26 NOTE — Progress Notes (Addendum)
   Subjective:    Patient ID: Elizabeth Riggs, female    DOB: August 20, 1971, 44 y.o.   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 She presented in 02/2015 with weight loss, intractable nausea and vomiting Had lap chole 03/12/15 -Pathology showed non-caseating granulomas.     11/26/2015  Chief Complaint  Patient presents with  . Follow-up    coughing up yellow mucus, but feeling fine.. unusual weight gain.  Potassium level high.     She  developed a dry cough since 03/2015 , without diurnal variation, denies sinus symptoms or heartburn. She was treated with cough syrup and Tessalon Perles without relief On pred since 03/2015, around 10-20 mg Gained 30 lbs  - feels more sob, trying to walk ' my knees are killing me' She developed pedal edema -echo showed LVH ,Was started on lasix & K supplement by her nephrologist- echo reviewed She had a lesion on her face that was biopsied-showed sarcoidosis and is improved with steroid ointment Labs obtained by PCP - hemolysed specimen - K 7.5 1 wk ago  Previous LFT were normal  Patient has been seen by the eye doctor.  Repeat bmet today shows potassium of 3.6 Chest x-ray shows persistent reticular infiltrates Spirometry showed FEV1 of 80% and FVC of 77% -slight decrease compared to 04/2015  Significant tests/ events  Chest x-ray 12/2014 showed right hilar prominence which was new compared to 05/2014 CT chest 03/2015 - mediastinal lymphadenopathy-subcarinal and precarinal, scattered subcentimeter nodules CT abdomen from 2010 -clear bases PFTs  04/2015 -nml   Echo 08/2015 showed nml  EF,Mild LVH c/w HTN    Past Medical History  Diagnosis Date  . Thyroid disease   . Hypertension   . Gallstones      Review of Systems neg for any significant sore throat, dysphagia, itching, sneezing, nasal congestion or excess/ purulent secretions, fever, chills, sweats, unintended wt  loss, pleuritic or exertional cp, hempoptysis, orthopnea pnd or change in chronic leg swelling. Also denies presyncope, palpitations, heartburn, abdominal pain, nausea, vomiting, diarrhea or change in bowel or urinary habits, dysuria,hematuria, rash, arthralgias, visual complaints, headache, numbness weakness or ataxia.     Objective:   Physical Exam  Gen. Pleasant, obese, in no distress ENT - no lesions, no post nasal drip Neck: No JVD, no thyromegaly, no carotid bruits Lungs: no use of accessory muscles, no dullness to percussion, decreased without rales or rhonchi  Cardiovascular: Rhythm regular, heart sounds  normal, no murmurs or gallops, no peripheral edema Musculoskeletal: No deformities, no cyanosis or clubbing , no tremors       Assessment & Plan:

## 2015-11-27 ENCOUNTER — Telehealth: Payer: Self-pay | Admitting: Pulmonary Disease

## 2015-11-27 NOTE — Telephone Encounter (Signed)
Patient Returned call [phone]

## 2015-11-27 NOTE — Telephone Encounter (Signed)
I spoke with patient about results and she verbalized understanding and had no questions 

## 2015-11-27 NOTE — Telephone Encounter (Signed)
Notes Recorded by Rigoberto Noel, MD on 11/27/2015 at 1:05 AM Potassium ok - can stop lasix & K as recomm CXR ok- can taper prednisone as insrtucted   LVM for patient to return call.

## 2015-11-30 ENCOUNTER — Emergency Department (HOSPITAL_BASED_OUTPATIENT_CLINIC_OR_DEPARTMENT_OTHER)
Admission: EM | Admit: 2015-11-30 | Discharge: 2015-11-30 | Disposition: A | Discharge status: Home / Self Care | Payer: 59 | Attending: Emergency Medicine | Admitting: Emergency Medicine

## 2015-11-30 ENCOUNTER — Encounter (HOSPITAL_BASED_OUTPATIENT_CLINIC_OR_DEPARTMENT_OTHER): Payer: Self-pay | Admitting: Emergency Medicine

## 2015-11-30 DIAGNOSIS — E875 Hyperkalemia: Secondary | ICD-10-CM | POA: Diagnosis present

## 2015-11-30 DIAGNOSIS — Z7952 Long term (current) use of systemic steroids: Secondary | ICD-10-CM | POA: Insufficient documentation

## 2015-11-30 DIAGNOSIS — Z79899 Other long term (current) drug therapy: Secondary | ICD-10-CM | POA: Diagnosis not present

## 2015-11-30 DIAGNOSIS — E876 Hypokalemia: Secondary | ICD-10-CM | POA: Diagnosis not present

## 2015-11-30 DIAGNOSIS — Z7951 Long term (current) use of inhaled steroids: Secondary | ICD-10-CM | POA: Diagnosis not present

## 2015-11-30 DIAGNOSIS — I10 Essential (primary) hypertension: Secondary | ICD-10-CM | POA: Diagnosis not present

## 2015-11-30 DIAGNOSIS — Z862 Personal history of diseases of the blood and blood-forming organs and certain disorders involving the immune mechanism: Secondary | ICD-10-CM | POA: Insufficient documentation

## 2015-11-30 DIAGNOSIS — E079 Disorder of thyroid, unspecified: Secondary | ICD-10-CM | POA: Diagnosis not present

## 2015-11-30 DIAGNOSIS — R899 Unspecified abnormal finding in specimens from other organs, systems and tissues: Secondary | ICD-10-CM

## 2015-11-30 DIAGNOSIS — Z8719 Personal history of other diseases of the digestive system: Secondary | ICD-10-CM | POA: Insufficient documentation

## 2015-11-30 HISTORY — DX: Sarcoidosis, unspecified: D86.9

## 2015-11-30 LAB — COMPREHENSIVE METABOLIC PANEL
ALK PHOS: 96 U/L (ref 38–126)
ALT: 15 U/L (ref 14–54)
AST: 17 U/L (ref 15–41)
Albumin: 3.6 g/dL (ref 3.5–5.0)
Anion gap: 7 (ref 5–15)
BUN: 18 mg/dL (ref 6–20)
CALCIUM: 9.2 mg/dL (ref 8.9–10.3)
CHLORIDE: 107 mmol/L (ref 101–111)
CO2: 27 mmol/L (ref 22–32)
CREATININE: 0.88 mg/dL (ref 0.44–1.00)
GFR calc Af Amer: >60 mL/min (ref >60)
GFR calc non Af Amer: >60 mL/min (ref >60)
GLUCOSE: 101 mg/dL — AB (ref 65–99)
Potassium: 3.3 mmol/L — ABNORMAL LOW (ref 3.5–5.1)
SODIUM: 141 mmol/L (ref 135–145)
Total Bilirubin: 0.5 mg/dL (ref 0.3–1.2)
Total Protein: 8.1 g/dL (ref 6.5–8.1)

## 2015-11-30 LAB — CBC
HCT: 40 % (ref 36.0–46.0)
HEMOGLOBIN: 13.2 g/dL (ref 12.0–15.0)
MCH: 28.1 pg (ref 26.0–34.0)
MCHC: 33 g/dL (ref 30.0–36.0)
MCV: 85.3 fL (ref 78.0–100.0)
PLATELETS: 291 10*3/uL (ref 150–400)
RBC: 4.69 MIL/uL (ref 3.87–5.11)
RDW: 15.5 % (ref 11.5–15.5)
WBC: 5.2 10*3/uL (ref 4.0–10.5)

## 2015-11-30 NOTE — Discharge Instructions (Signed)
Your potassium was 3.3 which is very slightly low but it is not elevated like it was last week.  Call your kidney doctor to go over your laboratory results and any further recommendations.  Hyperkalemia Hyperkalemia is when you have too much potassium in your blood. Potassium is normally removed (excreted) from your body by your kidneys. If there is too much potassium in your blood, it can affect your heart's ability to function.  CAUSES  Hyperkalemia may be caused by:   Taking in too much potassium. You can do this by:  Using salt substitutes. They contain large amounts of potassium.  Taking potassium supplements.  Eating foods high in potassium.  Excreting too little potassium. This can happen if:  Your kidneys are not working properly. Kidney (renal) disease, including short- or long-term renal failure, is a very common cause of hyperkalemia.  You are taking medicines that lower your excretion of potassium.  You have Addison disease.  You have a urinary tract blockage, such as kidney stones.  You are on treatment to mechanically clean your blood (dialysis) and you skip a treatment.  Releasing a high amount of potassium from your cells into your blood. This can happen with:  Injury to muscles (rhabdomyolysis) or other tissues. Most potassium is stored in your muscles.  Severe burns or infections.  Acidic blood plasma (acidosis). Acidosis can result from many diseases, such as uncontrolled diabetes. RISK FACTORS The most common risk factor of hyperkalemia is kidney disease. Other risk factors of hyperkalemia include:  Addison disease. This is a condition where your glands do not produce enough hormones.  Alcoholism or heavy drug use.   Using certain blood pressure medicines, such as angiotensin-converting enzyme (ACE) inhibitors, angiotensin II receptor blockers (ARBs), or potassium-sparing diuretics such as spironolactone.  Severe injury or burn. SIGNS AND SYMPTOMS    Oftentimes, there are no signs or symptoms of hyperkalemia. However, when your potassium level becomes high enough, you may experience symptoms such as:  Irregular or very slow heartbeat.  Nausea.  Fatigue.  Tingling of the skin or numbness of the hands or feet.  Muscle weakness.  Fatigue.  Not being able to move (paralysis). You may not have any symptoms of hyperkalemia.  DIAGNOSIS  Hyperkalemia may be diagnosed by:  Physical exam.  Blood tests.  ECG (electrocardiogram).  Discussion of prescription and non-prescription drug use. TREATMENT  Treatment for hyperkalemia is often directed at the underlying cause. In some instances, treatment may include:   Insulin.  Glucose (sugar) and water solution given through a vein (intravenous or IV).  Dialysis.  Medicines to remove the potassium from your body.  Medicines to move calcium from your bloodstream into your tissues. HOME CARE INSTRUCTIONS   Take medicines only as directed by your health care provider.  Do not take any supplements, natural products, herbs, or vitamins without reviewing them with your health care provider. Certain supplements and natural food products can have high amounts of potassium.  Limit your alcohol intake as directed by your health care provider.  Stop illegal drug use. If you need help quitting, ask your health care provider.  Keep all follow-up visits as directed by your health care provider. This is important.  If you have kidney disease, you may need to follow a low potassium diet. A dietitian can help educate you on low potassium foods. SEEK MEDICAL CARE IF:   You notice an irregular or very slow heartbeat.  You feel light-headed.  You feel weak.  You are  nauseous.  You have tingling or numbness in your hands or feet. SEEK IMMEDIATE MEDICAL CARE IF:   You have shortness of breath.  You have chest pain or discomfort.  You pass out.  You have muscle paralysis. MAKE  SURE YOU:   Understand these instructions.  Will watch your condition.  Will get help right away if you are not doing well or get worse.   This information is not intended to replace advice given to you by your health care provider. Make sure you discuss any questions you have with your health care provider.   Document Released: 12/02/2002 Document Revised: 01/02/2015 Document Reviewed: 03/19/2014 Elsevier Interactive Patient Education [employer].

## 2015-11-30 NOTE — ED Provider Notes (Signed)
CSN: [id number]     Arrival date & time 11/30/15  1816 History  By signing my name below, I, Elizabeth Riggs, attest that this documentation has been prepared under the direction and in the presence of Harvel Quale, MD. Electronically Signed: Eustaquio Riggs, ED Scribe. 11/30/2015. 9:15 PM.   Chief Complaint  Patient presents with  . Abnormal Lab   The history is provided by the patient. No language interpreter was used.     HPI Comments: Elizabeth Riggs is a 44 y.o. female who presents to the Emergency Department for abnormal potassium level of 7.5. Pt states that she had blood work done on 11/18/2015 and was told last week that it was high. Her PCP had her stop taking her potassium and her Lasix. She then called her nephrologist today about her retaining fluid and what she could do about that when her nephrologist told her that her potassium was 7.5 and that she should come to the ED immediately for further evaluation. Pt does not have any other complaints at this time and denies any associated symptoms.   Past Medical History  Diagnosis Date  . Thyroid disease   . Hypertension   . Gallstones   . Sarcoidosis [facility])    Past Surgical History  Procedure Laterality Date  . Right knee surgery    . Abdominal hysterectomy    . Cholecystectomy  03/12/15    [facility] in [place]   Family History  Problem Relation Age of Onset  . Hypertension Mother   . Diabetes Father   . Hypertension Father   . Heart attack Neg Hx   . Cancer Maternal Grandmother     breast  . Cancer Maternal Grandfather     prostate   Social History  Substance Use Topics  . Smoking status: Never Smoker   . Smokeless tobacco: None  . Alcohol Use: No   OB History    No data available     Review of Systems  Respiratory: Negative for shortness of breath.   Cardiovascular: Negative for chest pain.  All other systems reviewed and are negative.   Allergies  Morphine and related and  Percocet  Home Medications   Prior to Admission medications   Medication Sig Start Date End Date Taking? Authorizing Provider  furosemide (LASIX) 20 MG tablet Take 20 mg by mouth daily.    Historical Provider, MD  hydrochlorothiazide (HYDRODIURIL) 25 MG tablet Take 25 mg by mouth daily.    Historical Provider, MD  hydrocortisone 1 % lotion Apply to face at bedtime. 07/16/15   Rigoberto Noel, MD  levothyroxine (SYNTHROID, LEVOTHROID) 125 MCG tablet Take 1 tablet (125 mcg total) by mouth daily. 02/18/15   Debbrah Alar, NP  mometasone-formoterol (DULERA) 100-5 MCG/ACT AERO Inhale 2 puffs into the lungs 2 (two) times daily. 07/06/15   Tammy S Parrett, NP  nystatin (MYCOSTATIN) 100000 UNIT/ML suspension Take 5 mLs (500,000 Units total) by mouth 4 (four) times daily. For 10 day 07/06/15   Debbrah Alar, NP  pantoprazole (PROTONIX) 40 MG tablet Take 1 tablet (40 mg total) by mouth daily. 07/06/15   Debbrah Alar, NP  Potassium Chloride ER 20 MEQ TBCR 1 tablet once daily x 5 days 07/17/15   Rigoberto Noel, MD  predniSONE (DELTASONE) 10 MG tablet Take 1 tablet (10 mg total) by mouth 2 (two) times daily with a meal. 04/09/15   Rigoberto Noel, MD  spironolactone (ALDACTONE) 50 MG tablet Take 50 mg by mouth daily.  Historical Provider, MD  triamcinolone (KENALOG) 0.1 % paste Use as directed 1 application in the mouth or throat 2 (two) times daily. 04/28/15   Brunetta Jeans, PA-C   Triage Vitals: BP 126/93 mmHg  Pulse 89  Temp(Src) 97.9 F (36.6 C) (Oral)  Resp 18  Ht 5\' 5"  (1.651 m)  Wt 250 lb (113.399 kg)  BMI 41.60 kg/m2  SpO2 100%   Physical Exam  Constitutional: She is oriented to person, place, and time. She appears well-developed and well-nourished. No distress.  HENT:  Head: Normocephalic and atraumatic.  Eyes: Conjunctivae and EOM are normal.  Neck: Neck supple. No tracheal deviation present.  Cardiovascular: Normal rate and regular rhythm.   Pulmonary/Chest: Effort normal. No  respiratory distress.  Musculoskeletal: Normal range of motion.  Neurological: She is alert and oriented to person, place, and time.  Skin: Skin is warm and dry.  Psychiatric: She has a normal mood and affect. Her behavior is normal.  Nursing note and vitals reviewed.   ED Course  Procedures (including critical care time)  DIAGNOSTIC STUDIES: Oxygen Saturation is 100% on RA, normal by my interpretation.    COORDINATION OF CARE: 9:13 PM-Discussed treatment plan which includes following up with nephrologist with pt at bedside and pt agreed to plan.   Labs Review Labs Reviewed  COMPREHENSIVE METABOLIC PANEL - Abnormal; Notable for the following:    Potassium 3.3 (*)    Glucose, Bld 101 (*)    All other components within normal limits  CBC    Imaging Review No results found. I have personally reviewed and evaluated these lab results as part of my medical decision-making.   EKG Interpretation None      MDM  PAtient seen and evaluated in stable condition.  Reports she would not be here if she was not instructed to come have her K rechecked.  Today K 3.3.  Patient reports retaining fluid but does not feel this needs emergent evaluation and she said she can follow up with her nephrologist.  Patient discharged home in stable condition. Final diagnoses:  Abnormal laboratory test   1. Abnormal laboratory value, potassium  I personally performed the services described in this documentation, which was scribed in my presence. The recorded information has been reviewed and is accurate.      Harvel Quale, MD 12/01/15 [phone]

## 2015-11-30 NOTE — ED Notes (Signed)
MD at bedside. 

## 2015-11-30 NOTE — ED Notes (Signed)
Pt states she had blood work November 23rd and just got report that her K+ level was 7.5. Pt states has some fluid in legs. And feels really tired.

## 2015-12-01 ENCOUNTER — Telehealth: Payer: Self-pay | Admitting: Family

## 2015-12-01 NOTE — Telephone Encounter (Signed)
Please contact patient and arrange ED follow up with me in the next 1 week.

## 2015-12-02 NOTE — Telephone Encounter (Signed)
Scheduled for 12/07/15 3:30pm

## 2015-12-07 ENCOUNTER — Ambulatory Visit: Payer: 59 | Admitting: Family

## 2015-12-07 ENCOUNTER — Telehealth: Payer: Self-pay | Admitting: Family

## 2015-12-09 NOTE — Telephone Encounter (Signed)
Yes please

## 2015-12-09 NOTE — Telephone Encounter (Signed)
Date/Time of No Show: 12/07/15 3:30pm Type of Appt: er follow up Rescheduled: no, left msg to pt to call and reschedule # NS in last year: 2  Charge or No Charge?

## 2015-12-14 ENCOUNTER — Encounter: Payer: Self-pay | Admitting: Family

## 2015-12-14 ENCOUNTER — Ambulatory Visit (INDEPENDENT_AMBULATORY_CARE_PROVIDER_SITE_OTHER): Payer: 59 | Admitting: Family

## 2015-12-14 VITALS — BP 111/76 | HR 77 | Temp 97.8°F | Resp 18 | Ht 64.75 in | Wt 257.8 lb

## 2015-12-14 DIAGNOSIS — E039 Hypothyroidism, unspecified: Secondary | ICD-10-CM

## 2015-12-14 DIAGNOSIS — E876 Hypokalemia: Secondary | ICD-10-CM

## 2015-12-14 DIAGNOSIS — D869 Sarcoidosis, unspecified: Secondary | ICD-10-CM

## 2015-12-14 DIAGNOSIS — E875 Hyperkalemia: Secondary | ICD-10-CM

## 2015-12-14 MED ORDER — POTASSIUM CHLORIDE ER 20 MEQ PO TBCR: EXTENDED_RELEASE_TABLET | ORAL | Status: DC

## 2015-12-14 NOTE — Progress Notes (Signed)
Subjective:    Patient ID: Elizabeth Riggs, female    DOB: 08-15-1971, 44 y.o.   MRN: [id number]  HPI  Elizabeth Riggs is a 44 yr old female who presents today for ED follow up.   Wt Readings from Last 3 Encounters:  12/14/15 257 lb 12.8 oz (116.937 kg)  11/30/15 250 lb (113.399 kg)  11/26/15 256 lb (116.121 kg)  08/27/15            239  She has been on prednisone x 7 months. She follows with Dr. Elsworth Soho (pulmonology) who has been treating her sarcoidosis.  Cough is improved.  She is currently on lasix 20 meq (2 tabs) in AM and 50mg  of aldactone  Saw OB/GYN and was told that her potassium was 7.5 (was drawn the day before thanksgiving). Monday after her GYN called her. She has been taking her potassium at home.  She was told her thyroid function was normal.    Review of Systems See HPI  Past Medical History  Diagnosis Date  . Thyroid disease   . Hypertension   . Gallstones   . Sarcoidosis [facility])     Social History   Social History  . Marital Status: Single    Spouse Name: N/A  . Number of Children: N/A  . Years of Education: N/A   Occupational History  . Not on file.   Social History Main Topics  . Smoking status: Never Smoker   . Smokeless tobacco: Not on file  . Alcohol Use: No  . Drug Use: No  . Sexual Activity: Not on file   Other Topics Concern  . Not on file   Social History Narrative   She works as a Quarry manager at [employer]   3 children- all live locally, she has 3 grand children.   55- son   64- son   55- son   Completed 12th grade   Enjoys church, spending time with grand children.       Past Surgical History  Procedure Laterality Date  . Right knee surgery    . Abdominal hysterectomy    . Cholecystectomy  03/12/15    [facility] in [place]    Family History  Problem Relation Age of Onset  . Hypertension Mother   . Diabetes Father   . Hypertension Father   . Heart attack Neg Hx   . Cancer Maternal Grandmother      breast  . Cancer Maternal Grandfather     prostate    Allergies  Allergen Reactions  . Morphine And Related Itching  . Percocet [Oxycodone-Acetaminophen]     itching    Current Outpatient Prescriptions on File Prior to Visit  Medication Sig Dispense Refill  . furosemide (LASIX) 20 MG tablet Take 20 mg by mouth daily.    Marland Kitchen levothyroxine (SYNTHROID, LEVOTHROID) 125 MCG tablet Take 1 tablet (125 mcg total) by mouth daily.  0  . pantoprazole (PROTONIX) 40 MG tablet Take 1 tablet (40 mg total) by mouth daily. (Patient taking differently: Take 40 mg by mouth daily as needed. ) 30 tablet 3  . Potassium Chloride ER 20 MEQ TBCR 1 tablet once daily x 5 days 5 tablet 0  . predniSONE (DELTASONE) 10 MG tablet Take 1 tablet (10 mg total) by mouth 2 (two) times daily with a meal. (Patient taking differently: Take 1/2 tablet Mon, Wed, Friday) 60 tablet 0  . spironolactone (ALDACTONE) 50 MG tablet Take 50 mg by mouth  daily.     No current facility-administered medications on file prior to visit.    BP 111/76 mmHg  Pulse 77  Temp(Src) 97.8 F (36.6 C) (Oral)  Resp 18  Ht 5' 4.75" (1.645 m)  Wt 257 lb 12.8 oz (116.937 kg)  BMI 43.21 kg/m2  SpO2 100%       Objective:   Physical Exam  Constitutional: She appears well-developed and well-nourished.  Cardiovascular: Normal rate, regular rhythm and normal heart sounds.   No murmur heard. Pulmonary/Chest: Effort normal and breath sounds normal. No respiratory distress. She has no wheezes.  Musculoskeletal:  Trace bilateral LE edema  Psychiatric: She has a normal mood and affect. Her behavior is normal. Judgment and thought content normal.          Assessment & Plan:  Weight gain- we did discuss healthy diet, exercise and weight loss.

## 2015-12-14 NOTE — Progress Notes (Signed)
Pre visit review using our clinic review tool, if applicable. No additional management support is needed unless otherwise documented below in the visit note. 

## 2015-12-14 NOTE — Patient Instructions (Signed)
Please follow up tomorrow AM for labs. Restart potassium supplement.

## 2015-12-15 ENCOUNTER — Telehealth: Payer: Self-pay | Admitting: Family

## 2015-12-15 ENCOUNTER — Other Ambulatory Visit (INDEPENDENT_AMBULATORY_CARE_PROVIDER_SITE_OTHER): Payer: 59

## 2015-12-15 DIAGNOSIS — E875 Hyperkalemia: Secondary | ICD-10-CM

## 2015-12-15 DIAGNOSIS — E876 Hypokalemia: Secondary | ICD-10-CM

## 2015-12-15 LAB — BASIC METABOLIC PANEL
BUN: 13 mg/dL (ref 6–23)
CHLORIDE: 105 meq/L (ref 96–112)
CO2: 29 meq/L (ref 19–32)
Calcium: 9.3 mg/dL (ref 8.4–10.5)
Creatinine, Ser: 0.74 mg/dL (ref 0.40–1.20)
GFR: 109.28 mL/min (ref >60.00)
Glucose, Bld: 62 mg/dL — ABNORMAL LOW (ref 70–99)
POTASSIUM: 3.3 meq/L — AB (ref 3.5–5.1)
SODIUM: 141 meq/L (ref 135–145)

## 2015-12-15 MED ORDER — POTASSIUM CHLORIDE ER 20 MEQ PO TBCR: EXTENDED_RELEASE_TABLET | ORAL | Status: DC

## 2015-12-15 NOTE — Assessment & Plan Note (Signed)
I think her reading of K+ 7.5 was most likely a lab error because subsequent K+ has been low.  Most recent K+ is low. Resume daily Kdur and plan repeat bmet in 2 weeks, see 12/20 phone noted.

## 2015-12-15 NOTE — Telephone Encounter (Signed)
Potassium is low. Continue kdur 43meq once daily repeat bmet in 2 weeks. Dx hypokalemia.

## 2015-12-15 NOTE — Assessment & Plan Note (Signed)
She is following with Dr. Elsworth Soho. Pt is on pred taper.  Prednisone is likely cause for her weight gain.

## 2015-12-16 MED ORDER — POTASSIUM CHLORIDE ER 20 MEQ PO TBCR: EXTENDED_RELEASE_TABLET | ORAL | Status: DC

## 2015-12-16 NOTE — Addendum Note (Signed)
Addended by: Kelle Darting A on: 12/16/2015 03:45 PM   Modules accepted: Orders

## 2015-12-16 NOTE — Telephone Encounter (Signed)
Received fax from [facility] that potassium rx needs clarification on directions. Rx from 12/14/15 said 1 tablet daily x 5 days. Rx was re-entered on 12/15/15 for 1 tablet once a day (ongoing) but appears it did not transmit to pharmacy. Rx re-sent.

## 2015-12-16 NOTE — Telephone Encounter (Signed)
Notified pt and scheduled lab appt for 12/30/15 at 7:45am.  Future order entered.

## 2015-12-30 ENCOUNTER — Other Ambulatory Visit (INDEPENDENT_AMBULATORY_CARE_PROVIDER_SITE_OTHER): Payer: 59

## 2015-12-30 ENCOUNTER — Other Ambulatory Visit: Payer: 59

## 2015-12-30 DIAGNOSIS — E876 Hypokalemia: Secondary | ICD-10-CM

## 2015-12-31 LAB — BASIC METABOLIC PANEL
BUN: 13 mg/dL (ref 6–23)
CHLORIDE: 104 meq/L (ref 96–112)
CO2: 29 meq/L (ref 19–32)
CREATININE: 1.13 mg/dL (ref 0.40–1.20)
Calcium: 9.4 mg/dL (ref 8.4–10.5)
GFR: 67.04 mL/min (ref >60.00)
Glucose, Bld: 105 mg/dL — ABNORMAL HIGH (ref 70–99)
Potassium: 3.4 mEq/L — ABNORMAL LOW (ref 3.5–5.1)
Sodium: 141 mEq/L (ref 135–145)

## 2016-01-01 ENCOUNTER — Telehealth: Payer: Self-pay | Admitting: Family

## 2016-01-01 DIAGNOSIS — E876 Hypokalemia: Secondary | ICD-10-CM

## 2016-01-01 NOTE — Telephone Encounter (Signed)
Left message for pt to return my call.

## 2016-01-01 NOTE — Telephone Encounter (Signed)
Notified pt and she voices understanding. Future lab appt scheduled for 01/08/16 at 4pm and future order entered.

## 2016-01-01 NOTE — Telephone Encounter (Signed)
Potassium is still low. If she is taking kdur once daily need to increase to bid. Ok to send refill with new instructions.  Repeat bmet in 1 week, dx hypokalemia.

## 2016-01-01 NOTE — Telephone Encounter (Signed)
Pt states she has not missed any doses but still has large quantity on hand. She will take 1 twice a day until current supply is complete then request new Rx.

## 2016-01-01 NOTE — Addendum Note (Signed)
Addended by: Kelle Darting A on: 01/01/2016 04:55 PM   Modules accepted: Medications

## 2016-01-08 ENCOUNTER — Other Ambulatory Visit (INDEPENDENT_AMBULATORY_CARE_PROVIDER_SITE_OTHER): Payer: 59

## 2016-01-08 DIAGNOSIS — E876 Hypokalemia: Secondary | ICD-10-CM

## 2016-01-08 LAB — BASIC METABOLIC PANEL
BUN: 9 mg/dL (ref 7–25)
CALCIUM: 9.1 mg/dL (ref 8.6–10.2)
CHLORIDE: 102 mmol/L (ref 98–110)
CO2: 25 mmol/L (ref 20–31)
Creat: 0.78 mg/dL (ref 0.50–1.10)
GLUCOSE: 95 mg/dL (ref 65–99)
POTASSIUM: 3.4 mmol/L — AB (ref 3.5–5.3)
SODIUM: 136 mmol/L (ref 135–146)

## 2016-01-09 ENCOUNTER — Telehealth: Payer: Self-pay | Admitting: Family

## 2016-01-09 DIAGNOSIS — E876 Hypokalemia: Secondary | ICD-10-CM

## 2016-01-09 NOTE — Telephone Encounter (Signed)
Potassium is low.  Please confirm that patient has been compliant with Kdur 20 meq bid.  If she has been compliant then we need to increase to 20 meq tid and repeat bmet in 2 weeks.dx hypokalemia.

## 2016-01-11 ENCOUNTER — Telehealth: Payer: Self-pay | Admitting: Family

## 2016-01-11 NOTE — Telephone Encounter (Signed)
Pt called in to request her lab results.    CB: [phone]

## 2016-01-11 NOTE — Telephone Encounter (Signed)
Left detailed message on note below for pt to call back with any questions.//HM

## 2016-01-11 NOTE — Telephone Encounter (Signed)
Notified pt and she confirmed compliance with twice daily Kdur. She will increase as below and lab appt has been scheduled for 01/25/16 at 4pm.  Future order entered.

## 2016-01-11 NOTE — Telephone Encounter (Signed)
See previous phone note from 01/09/16.

## 2016-01-21 ENCOUNTER — Ambulatory Visit (INDEPENDENT_AMBULATORY_CARE_PROVIDER_SITE_OTHER): Payer: 59 | Admitting: Family Medicine

## 2016-01-21 ENCOUNTER — Encounter: Payer: Self-pay | Admitting: Family Medicine

## 2016-01-21 VITALS — BP 115/78 | HR 98 | Ht 65.0 in | Wt 245.0 lb

## 2016-01-21 DIAGNOSIS — M25561 Pain in right knee: Secondary | ICD-10-CM

## 2016-01-21 DIAGNOSIS — M25562 Pain in left knee: Secondary | ICD-10-CM | POA: Diagnosis not present

## 2016-01-21 MED ORDER — HYDROCODONE-ACETAMINOPHEN 5-325 MG PO TABS: 1.0000 | ORAL_TABLET | Freq: Four times a day (QID) | ORAL | Status: DC | PRN

## 2016-01-21 MED FILL — HYDROCODON-APAP 5-325: 5-325 | 10 days supply | Qty: 40 | Fill #0

## 2016-01-21 NOTE — Patient Instructions (Signed)
Call me if you want to try injections. Norco as needed for severe pain (no driving on this). Get x-rays tomorrow - we will call you with the results and next steps.

## 2016-01-22 ENCOUNTER — Ambulatory Visit (HOSPITAL_BASED_OUTPATIENT_CLINIC_OR_DEPARTMENT_OTHER)
Admission: RE | Admit: 2016-01-22 | Discharge: 2016-01-22 | Disposition: A | Discharge status: Home / Self Care | Payer: 59 | Source: Ambulatory Visit | Attending: Family Medicine | Admitting: Family Medicine

## 2016-01-22 DIAGNOSIS — M170 Bilateral primary osteoarthritis of knee: Secondary | ICD-10-CM | POA: Diagnosis not present

## 2016-01-22 DIAGNOSIS — M25561 Pain in right knee: Secondary | ICD-10-CM | POA: Diagnosis not present

## 2016-01-22 DIAGNOSIS — M25562 Pain in left knee: Secondary | ICD-10-CM | POA: Diagnosis not present

## 2016-01-22 DIAGNOSIS — M7989 Other specified soft tissue disorders: Secondary | ICD-10-CM | POA: Insufficient documentation

## 2016-01-25 ENCOUNTER — Other Ambulatory Visit: Payer: 59

## 2016-01-26 NOTE — Progress Notes (Signed)
Subjective:    Patient ID: Elizabeth Riggs, female    DOB: [birth year], 45 y.o.   MRN: [id number]  Knee Pain   Leg Pain    45 yo F here for f/u bilateral knee pain  5/14: Patient reports no known injury. States began having pain behind left knee about 2 weeks ago. Felt like she had a cramp in her leg behind knee that has not gone away. Hard to put pressure on this leg 2/2 pain here. Has not increased activity level prior to pain. Pain is worse with bending and prolonged standing. No prior left knee injuries or surgeries. Feels stiff. Went to [facility] ED - had doppler u/s that was negative for DVT (were able to confirm by obtaining records).  Did note there were 2 possible baker's cysts in this knee though. Given prescription for percocet.  6/4: Patient has been going to PT for popliteal strain/spasm and states helps some while there but massage hurts a lot when they do this Mobic helps only for about an hour Knee swelling and now having medial left knee pain as well. Going to start 12 hour shifts at work - pain worse with prolonged standing, walking Walking with a limp. Still has stiffness within knee as well. No true catching or locking.  6/18: Patient is significantly improved after cortisone injection, no pain currently. 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 No longer icing or taking mobic. Finished with PT. Ready to go back to work full time.  8/2: Patient did extremely well until about 2 weeks ago when pain recurred. Pain up to an 8/10 and worse when sitting for prolonged period then goes to get up. Pain anterior and medial with associated swelling. No mechanical symptoms. Is icing Not taking any aleve, mobic, or other medicines  09/26/11: Patient reports last injection only helped for about a week. Pain is about the same - anteromedial with swelling. No catching, locking. Feels  unstable at times. Taking ibuprofen - has tried aleve, mobic in past. Had two cortisone injections (first lasted for about 2 months). Also did PT.  03/20/13: Patient had arthroscopy of left knee with good relief of her pain. Then states has been limping more over past several weeks to months. Recalls about a week ago was worsened more when she slipped and fell in [place] but she's unable to tell me exactly how she fell down. Taking naproxen which helps some. Pain throughout left knee but mostly posterior. Not doing anything for pain including exercises, icing, medication. Works in transportation now.  08/05/13: Patient returns now with bilateral knee pain. Reports she's not currently taking any medicine for pain. Had been taking aleve at nighttime. Walks a lot at work. Describes aching, stiffness. Difficulty sleeping due to pain. Will wake her up in early morning. Does not want repeat injections.  06/18/15: Patient reports she was doing well until about 3 days ago when knees started hurting without a known injury or increase in activity level. Was really bad yesterday but seems to have improved today. No catching, locking, giving out.  01/21/16: Patient returns with 9/10 level pain both knees. Associated stiffness. On prednisone for sarcoidosis. Has taken ibuprofen as well. No skin changes, fever.  Past Medical History  Diagnosis Date  . Thyroid disease   . Hypertension   . Gallstones   . Sarcoidosis ([place])     Current  Outpatient Prescriptions on File Prior to Visit  Medication Sig Dispense Refill  . furosemide (LASIX) 20 MG tablet Take 20 mg by mouth daily.    Marland Kitchen levothyroxine (SYNTHROID, LEVOTHROID) 125 MCG tablet Take 1 tablet (125 mcg total) by mouth daily.  0  . pantoprazole (PROTONIX) 40 MG tablet Take 1 tablet (40 mg total) by mouth daily. (Patient taking differently: Take 40 mg by mouth daily as needed. ) 30 tablet 3  . Potassium Chloride ER 20 MEQ TBCR 1 tablet  PO once daily (Patient taking differently: Take 1 tablet by mouth 3 (three) times daily. ) 30 tablet 2  . predniSONE (DELTASONE) 10 MG tablet Take 1 tablet (10 mg total) by mouth 2 (two) times daily with a meal. (Patient taking differently: Take 1/2 tablet Mon, Wed, Friday) 60 tablet 0  . spironolactone (ALDACTONE) 50 MG tablet Take 50 mg by mouth daily.     No current facility-administered medications on file prior to visit.    Past Surgical History  Procedure Laterality Date  . Right knee surgery    . Abdominal hysterectomy    . Cholecystectomy  03/12/15    [facility] in [place]    Allergies  Allergen Reactions  . Morphine And Related Itching  . Percocet [Oxycodone-Acetaminophen]     itching    Social History   Social History  . Marital Status: Single    Spouse Name: N/A  . Number of Children: N/A  . Years of Education: N/A   Occupational History  . Not on file.   Social History Main Topics  . Smoking status: Never Smoker   . Smokeless tobacco: Not on file  . Alcohol Use: No  . Drug Use: No  . Sexual Activity: Not on file   Other Topics Concern  . Not on file   Social History Narrative   She works as a Quarry manager at [employer]   3 children- all live locally, she has 3 grand children.   49- son   62- son   4- son   Completed 12th grade   Enjoys church, spending time with grand children.       Family History  Problem Relation Age of Onset  . Hypertension Mother   . Diabetes Father   . Hypertension Father   . Heart attack Neg Hx   . Cancer Maternal Grandmother     breast  . Cancer Maternal Grandfather     prostate    BP 115/78 mmHg  Pulse 98  Ht 5\' 5"  (1.651 m)  Wt 245 lb (111.131 kg)  BMI 40.77 kg/m2  Review of Systems See HPI above.    Objective:   Physical Exam Gen: NAD  Bilateral knees: No gross deformity, effusion, bruising. Mild diffuse anterior tenderness. FROM. Stable to valgus/varus stress.   Negative ant/post drawers.  Negative lachmanns Negative mcmurrays, apleys.  Negative apprehension. NVI distally.    Assessment & Plan:  1. Bilateral knee pain - 2/2 severe osteoarthritis - independently reviewed radiographs to confirm this.  She would like to continue with her prednisone (mainly for sarcoid) and temporary script for norco.  She will consider return for cortisone injections.

## 2016-01-26 NOTE — Assessment & Plan Note (Signed)
2/2 severe osteoarthritis - independently reviewed radiographs to confirm this.  She would like to continue with her prednisone (mainly for sarcoid) and temporary script for norco.  She will consider return for cortisone injections.

## 2016-01-27 ENCOUNTER — Other Ambulatory Visit (INDEPENDENT_AMBULATORY_CARE_PROVIDER_SITE_OTHER): Payer: 59

## 2016-01-27 DIAGNOSIS — E876 Hypokalemia: Secondary | ICD-10-CM | POA: Diagnosis not present

## 2016-01-27 LAB — BASIC METABOLIC PANEL
BUN: 10 mg/dL (ref 6–23)
CHLORIDE: 107 meq/L (ref 96–112)
CO2: 23 meq/L (ref 19–32)
Calcium: 9.2 mg/dL (ref 8.4–10.5)
Creatinine, Ser: 0.77 mg/dL (ref 0.40–1.20)
GFR: 104.33 mL/min (ref >60.00)
Glucose, Bld: 94 mg/dL (ref 70–99)
POTASSIUM: 3.4 meq/L — AB (ref 3.5–5.1)
SODIUM: 140 meq/L (ref 135–145)

## 2016-01-28 ENCOUNTER — Telehealth: Payer: Self-pay | Admitting: Family

## 2016-01-28 DIAGNOSIS — E876 Hypokalemia: Secondary | ICD-10-CM

## 2016-01-28 NOTE — Telephone Encounter (Signed)
Pt notified and made aware.  She stated understanding.  She says that she was taking 3 tablets daily (1 tablet in the am, noon, and evening).  Pt was advised to increase dose to 2 tablets in the morning and 2 tablets in the evening, repeat in labs in 1 week.  She stated understanding and agreed with plan.  Lab appt schedule. Future lab ordered.

## 2016-01-28 NOTE — Telephone Encounter (Signed)
Pt called and made aware.  No questions at this time.

## 2016-01-28 NOTE — Telephone Encounter (Signed)
K still low.  Our records show Kdur 20 TID.  If she is compliant with this regimen, then we need to change to 40 mEQ (2 tabs) bid and plan to repeat bmet in 1 week, dx hypokalemia.

## 2016-01-28 NOTE — Telephone Encounter (Signed)
Caller name: Self  Can be reached: [phone]    Reason for call: Patient request lab results

## 2016-02-05 ENCOUNTER — Other Ambulatory Visit: Payer: 59

## 2016-02-08 ENCOUNTER — Other Ambulatory Visit: Payer: 59

## 2016-02-09 ENCOUNTER — Telehealth: Payer: Self-pay | Admitting: *Deleted

## 2016-02-09 ENCOUNTER — Other Ambulatory Visit (INDEPENDENT_AMBULATORY_CARE_PROVIDER_SITE_OTHER): Payer: 59

## 2016-02-09 DIAGNOSIS — E876 Hypokalemia: Secondary | ICD-10-CM | POA: Diagnosis not present

## 2016-02-09 LAB — BASIC METABOLIC PANEL
BUN: 12 mg/dL (ref 6–23)
CHLORIDE: 105 meq/L (ref 96–112)
CO2: 28 meq/L (ref 19–32)
Calcium: 9.5 mg/dL (ref 8.4–10.5)
Creatinine, Ser: 0.81 mg/dL (ref 0.40–1.20)
GFR: 98.39 mL/min (ref >60.00)
GLUCOSE: 97 mg/dL (ref 70–99)
POTASSIUM: 3.9 meq/L (ref 3.5–5.1)
Sodium: 139 mEq/L (ref 135–145)

## 2016-02-09 NOTE — Telephone Encounter (Signed)
Notified pt and she voices understanding. Med list updated. 

## 2016-02-09 NOTE — Telephone Encounter (Signed)
-----   Message from Debbrah Alar, NP sent at 02/09/2016 11:53 AM EST ----- K+ looks good. Please continue Kdur 45mEQ two tabs in AM and 2 tabs in PM.

## 2016-02-17 ENCOUNTER — Ambulatory Visit: Payer: 59 | Admitting: Adult Health

## 2016-02-25 ENCOUNTER — Encounter: Payer: Self-pay | Admitting: Adult Health

## 2016-02-25 ENCOUNTER — Ambulatory Visit (INDEPENDENT_AMBULATORY_CARE_PROVIDER_SITE_OTHER): Payer: 59 | Admitting: Adult Health

## 2016-02-25 VITALS — BP 124/86 | HR 71 | Ht 68.0 in | Wt 264.0 lb

## 2016-02-25 DIAGNOSIS — D869 Sarcoidosis, unspecified: Secondary | ICD-10-CM

## 2016-02-25 NOTE — Patient Instructions (Signed)
Continue on current regimen  Low salt diet  Follow up with Dr. Elsworth Soho  In 6 months and As needed

## 2016-02-25 NOTE — Assessment & Plan Note (Signed)
No flare off steroids   Plan  Continue on current regimen  Low salt diet  Follow up with Dr. Elsworth Soho  In 6 months and As needed

## 2016-02-25 NOTE — Progress Notes (Signed)
Subjective:    Patient ID: Elizabeth Riggs, female    DOB: 1971-01-19, 45 y.o.   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 She presented in 02/2015 with weight loss, intractable nausea and vomiting Had lap chole 03/12/15 and was told that she had a growth on her GB  Pathology showed non-caseating granulomas. She subsequently developed a dry cough, without diurnal variation, denies sinus symptoms or heartburn. She was treated with cough syrup and Tessalon Perles without relief Chest x-ray 12/2014 showed right hilar prominence which was new compared to 05/2014 CT chest 04/01/15 - mediastinal lymphadenopathy-subcarinal and precarinal, scattered subcentimeter nodules CT abdomen from 2010 was reviewed PFTs  -nml      02/25/2016 Follow up :  Sarcoid Pulmonary /skin involvement  Pt returns for a 2 month follow up for sarcoid .  Last ov , instructed to taper off steroids slowly   She has been off prednisone for 4 days.  No flare of cough , no rash.  Weight is trending up .  Says she stopped her diuretic for 2 weeks but restarted it yesterday.  We discussed medication compliance .  Notices ankles were more swollen, better today after taking aldactone.  Declines flu shot.   Under a lot of stress . Lost job and no insurance .  Says she can afford diuretic rx.    Denies chest pain,orthopnea, fever , hemoptysis .     Past Medical History  Diagnosis Date  . Thyroid disease   . Hypertension   . Gallstones   . Sarcoidosis [facility])    Current Outpatient Prescriptions on File Prior to Visit  Medication Sig Dispense Refill  . furosemide (LASIX) 20 MG tablet Take 20 mg by mouth daily.    Marland Kitchen HYDROcodone-acetaminophen (NORCO) 5-325 MG tablet Take 1 tablet by mouth every 6 (six) hours as needed for moderate pain. 40 tablet 0  . levothyroxine (SYNTHROID, LEVOTHROID) 125 MCG tablet Take 1 tablet (125 mcg total) by mouth daily.  0  . pantoprazole  (PROTONIX) 40 MG tablet Take 1 tablet (40 mg total) by mouth daily. (Patient taking differently: Take 40 mg by mouth daily as needed. ) 30 tablet 3  . potassium chloride SA (K-DUR,KLOR-CON) 20 MEQ tablet Take 40 mEq by mouth 2 (two) times daily.    Marland Kitchen spironolactone (ALDACTONE) 50 MG tablet Take 50 mg by mouth daily.     No current facility-administered medications on file prior to visit.      Review of System Constitutional:   No  weight loss, night sweats,  Fevers, chills, +fatigue, or  lassitude.  HEENT:   No headaches,  Difficulty swallowing,  Tooth/dental problems, or  Sore throat,                No sneezing, itching, ear ache, nasal congestion, post nasal drip,   CV:  No chest pain,  Orthopnea, PND, swelling in lower extremities, anasarca, dizziness, palpitations, syncope.   GI  No heartburn, indigestion, abdominal pain, nausea, vomiting, diarrhea, change in bowel habits, loss of appetite, bloody stools.   Resp:  .  No excess mucus, no productive cough,  No non-productive cough,  No coughing up of blood.  No change in color of mucus.  No wheezing.  No chest wall deformity  Skin: no rash or lesions.  GU: no dysuria, change in color of urine, no urgency or frequency.  No flank pain, no hematuria   MS:  No joint  pain or swelling.  No decreased range of motion.  No back pain.  Psych:  No change in mood or affect. No depression or anxiety.  No memory loss.         Objective:   Physical Exam Filed Vitals:   02/25/16 1105  BP: 124/86  Pulse: 71  Height: 5\' 8"  (1.727 m)  Weight: 264 lb (119.75 kg)  SpO2: 96%      Gen. Pleasant, obese, in no distress ENT - no lesions, no post nasal drip Neck: No JVD, no thyromegaly, no carotid bruits Lungs: no use of accessory muscles, no dullness to percussion, decreased without rales or rhonchi  Cardiovascular: Rhythm regular, heart sounds  normal, no murmurs or gallops, no peripheral edema Musculoskeletal: No deformities, no cyanosis  or clubbing , no tremors Skin : no skin lesions noted.     [name] NP-C  [facility]  02/25/2016     Assessment & Plan:

## 2016-03-10 NOTE — Progress Notes (Signed)
Reviewed & agree with plan  

## 2016-03-14 ENCOUNTER — Telehealth: Payer: Self-pay | Admitting: Family

## 2016-03-14 ENCOUNTER — Ambulatory Visit: Payer: 59 | Admitting: Family

## 2016-03-15 ENCOUNTER — Encounter: Payer: Self-pay | Admitting: Family

## 2016-03-15 ENCOUNTER — Ambulatory Visit: Payer: 59 | Admitting: Family

## 2016-03-15 ENCOUNTER — Telehealth: Payer: Self-pay | Admitting: Family

## 2016-03-15 NOTE — Telephone Encounter (Signed)
Marked to charge and mailing no show letter °

## 2016-03-15 NOTE — Telephone Encounter (Signed)
Pt was no show 03/14/16, scheduled to come in today, 3/20 marked 3rd no show since 09/2015, charge or no charge?

## 2016-03-15 NOTE — Telephone Encounter (Signed)
Yes please

## 2016-03-16 ENCOUNTER — Encounter: Payer: Self-pay | Admitting: Family

## 2016-03-16 NOTE — Telephone Encounter (Signed)
Marked to charge and mailing no show letter °

## 2016-03-16 NOTE — Telephone Encounter (Signed)
Yes please

## 2016-03-16 NOTE — Telephone Encounter (Signed)
Pt was no show 03/15/16 9:30am for follow up appt, pt has not rescheduled, 4th no show since 09/2015 (2 days in a row), charge or no charge?

## 2016-03-23 ENCOUNTER — Encounter: Payer: Self-pay | Admitting: Family

## 2016-03-25 ENCOUNTER — Telehealth: Payer: Self-pay | Admitting: Family

## 2016-03-25 NOTE — Telephone Encounter (Signed)
Patient dismissed from [facility] by Debbrah Alar NP , effective March 23, 2016. Dismissal letter sent out by certified / registered mail.  DAJ

## 2016-07-13 ENCOUNTER — Ambulatory Visit: Payer: 59 | Admitting: Acute Care

## 2016-07-13 ENCOUNTER — Emergency Department (HOSPITAL_BASED_OUTPATIENT_CLINIC_OR_DEPARTMENT_OTHER)
Admission: EM | Admit: 2016-07-13 | Discharge: 2016-07-13 | Disposition: A | Discharge status: Home / Self Care | Payer: 59 | Attending: Emergency Medicine | Admitting: Emergency Medicine

## 2016-07-13 ENCOUNTER — Encounter (HOSPITAL_BASED_OUTPATIENT_CLINIC_OR_DEPARTMENT_OTHER): Payer: Self-pay

## 2016-07-13 DIAGNOSIS — H53149 Visual discomfort, unspecified: Secondary | ICD-10-CM | POA: Insufficient documentation

## 2016-07-13 DIAGNOSIS — R5383 Other fatigue: Secondary | ICD-10-CM | POA: Insufficient documentation

## 2016-07-13 DIAGNOSIS — R51 Headache: Secondary | ICD-10-CM | POA: Insufficient documentation

## 2016-07-13 DIAGNOSIS — R519 Headache, unspecified: Secondary | ICD-10-CM

## 2016-07-13 DIAGNOSIS — I10 Essential (primary) hypertension: Secondary | ICD-10-CM | POA: Insufficient documentation

## 2016-07-13 DIAGNOSIS — L740 Miliaria rubra: Secondary | ICD-10-CM | POA: Insufficient documentation

## 2016-07-13 LAB — BASIC METABOLIC PANEL
Anion gap: 7 (ref 5–15)
BUN: 13 mg/dL (ref 6–20)
CALCIUM: 9.2 mg/dL (ref 8.9–10.3)
CHLORIDE: 105 mmol/L (ref 101–111)
CO2: 29 mmol/L (ref 22–32)
CREATININE: 0.79 mg/dL (ref 0.44–1.00)
GFR calc Af Amer: >60 mL/min (ref >60)
GFR calc non Af Amer: >60 mL/min (ref >60)
Glucose, Bld: 107 mg/dL — ABNORMAL HIGH (ref 65–99)
Potassium: 3.5 mmol/L (ref 3.5–5.1)
Sodium: 141 mmol/L (ref 135–145)

## 2016-07-13 MED ORDER — HYDROCODONE-ACETAMINOPHEN 5-325 MG PO TABS
1.0000 | ORAL_TABLET | Freq: Once | ORAL | Status: AC
  Administered 2016-07-13: 1 via ORAL
  Filled 2016-07-13: qty 1

## 2016-07-13 NOTE — ED Notes (Signed)
Woke this am with HA, weakness-rash to both arms-rash x 6 days-states "i need yall to check my potassium"-NAD-steady gait

## 2016-07-13 NOTE — ED Notes (Signed)
Pt states son is out front waiting for her to drive her home.

## 2016-07-13 NOTE — ED Notes (Signed)
Pt informed she was receiving Norco for pain which is a narcotic and she is not allowed to drive after taking medication. Pt verbalized understanding and states her son is coming to pick her up.

## 2016-07-13 NOTE — Discharge Instructions (Signed)
Avoid heat. Cool compresses or hydrocortisone cream for your rash. Double your potassium for the next 5 days, then resume normal dose. Push fluids stay hydrated. Motrin or Tylenol for your headache if it recurs   General Headache Without Cause A headache is pain or discomfort felt around the head or neck area. There are many causes and types of headaches. In some cases, the cause may not be found.  HOME CARE  Managing Pain  Take over-the-counter and prescription medicines only as told by your doctor.  Lie down in a dark, quiet room when you have a headache.  If directed, apply ice to the head and neck area:  Put ice in a plastic bag.  Place a towel between your skin and the bag.  Leave the ice on for 20 minutes, 2-3 times per day.  Use a heating pad or hot shower to apply heat to the head and neck area as told by your doctor.  Keep lights dim if bright lights bother you or make your headaches worse. Eating and Drinking  Eat meals on a regular schedule.  Lessen how much alcohol you drink.  Lessen how much caffeine you drink, or stop drinking caffeine. General Instructions  Keep all follow-up visits as told by your doctor. This is important.  Keep a journal to find out if certain things bring on headaches. For example, write down:  What you eat and drink.  How much sleep you get.  Any change to your diet or medicines.  Relax by getting a massage or doing other relaxing activities.  Lessen stress.  Sit up straight. Do not tighten (tense) your muscles.  Do not use tobacco products. This includes cigarettes, chewing tobacco, or e-cigarettes. If you need help quitting, ask your doctor.  Exercise regularly as told by your doctor.  Get enough sleep. This often means 7-9 hours of sleep. GET HELP IF:  Your symptoms are not helped by medicine.  You have a headache that feels different than the other headaches.  You feel sick to your stomach (nauseous) or you throw  up (vomit).  You have a fever. GET HELP RIGHT AWAY IF:   Your headache becomes really bad.  You keep throwing up.  You have a stiff neck.  You have trouble seeing.  You have trouble speaking.  You have pain in the eye or ear.  Your muscles are weak or you lose muscle control.  You lose your balance or have trouble walking.  You feel like you will pass out (faint) or you pass out.  You have confusion.   This information is not intended to replace advice given to you by your health care provider. Make sure you discuss any questions you have with your health care provider.   Document Released: 09/20/2008 Document Revised: 09/02/2015 Document Reviewed: 04/06/2015 Elsevier Interactive Patient Education 2016 [place] Rash, Adult Heat rash (miliaria) is a skin condition that causes a rash of little red bumps around the openings to your sweat glands on your skin. It happens when sweat glands are blocked. There are four types of heat rash:  Miliaria crystallina. This is the mildest version. It causes small, clear blisters (vesicles) on the skin.  Miliaria rubra. In this type of heat rash, the ducts are blocked deeper in the skin. The rash is very itchy and consists of small bumps (papules). There may also be a fever.  Miliaria pustulosa. This rash is similar to miliaria rubra, but the bumps are pimples (  pustules) rather than papules.  Miliaria profunda. The sweat ducts are blocked deep in the skin. Sometimes flesh-colored papules develop on the skin. This type can lead to heat exhaustion. It requires immediate medical attention. Most cases of heat rash can be managed easily. CAUSES  Heat rash is caused by blocked sweat glands. The sweat glands can become blocked by:  Wearing heavy or tight clothing during exercise or extreme heat.  Having a fever and sweating while in bed.  Certain medicines. RISK FACTORS This condition is more likely to develop:  In people who  live or work in hot, [place].  During periods of intense exercise and sweating.  In people who are not accustomed to heat. SYMPTOMS  Symptoms of heat rash include:   Small vesicles that break open easily. These vesicles are usually found on the trunk of the body.  Clusters of itchy, red papules or pustules. These often develop on the neck and upper chest, in the groin, under the breasts, and in elbow creases. DIAGNOSIS  Your health care provider may suspect heat rash based on your symptoms. Your health care provider will also do a physical exam. This may include taking a sample of the fluid from your vesicles or pustules for testing. TREATMENT  Moving to a cool, dry place is the best treatment for heat rash. Treatment may also include medicines, such as:  Corticosteroid creams for skin irritation.  Antibiotic medicines for an infection. HOME CARE INSTRUCTIONS  Skin care  Keep the affected area dry.  Do not use ointments or creams. These can make the condition worse.  Apply cool compresses to the affected areas.  Do not scratch your skin.  Do not take hot showers or baths. General Instructions  Take over-the-counter and prescription medicines only as told by your health care provider.  If you were prescribed an antibiotic, take it as told by your health care provider. Do not stop taking it even if your condition improves.  Stay in a cool room as much as possible. Use an air conditioner or fan, if possible.  Do not wear tight clothes. Wear comfortable, loose-fitting clothing.  Keep all follow-up visits as told by your health care provider. This is important. SEEK MEDICAL CARE IF:   Your heat rash does not go away within several days.  Your heat rash gets worse.  You have a fever or chills. SEEK IMMEDIATE MEDICAL CARE IF:   You are having cramps or muscle contractions.  You have chest pain.  You have trouble breathing.  You faint.   This information is  not intended to replace advice given to you by your health care provider. Make sure you discuss any questions you have with your health care provider.   Document Released: 11/30/2009 Document Revised: 09/02/2015 Document Reviewed: 04/29/2015 Elsevier Interactive Patient Education [employer].

## 2016-07-13 NOTE — ED Provider Notes (Signed)
CSN: [id number]     Arrival date & time 07/13/16  1959 History  By signing my name below, I, Irene Pap, attest that this documentation has been prepared under the direction and in the presence of Tanna Furry, MD. Electronically Signed: Irene Pap, ED Scribe. 07/13/2016. 8:24 PM.    Chief Complaint  Patient presents with  . Headache   The history is provided by the patient. No language interpreter was used.  HPI Comments: Elizabeth Riggs is a 45 y.o. Female with a hx of sarcoidosis, HTN and thyroid disease who presents to the Emergency Department complaining of gradually worsening "painful" headache onset earlier this morning. Pt woke up with the headache and currently rates her pain 7/10. She reports associated fatigue, photophobia, and non-itching rash to bilateral arms and chest onset 6 days ago. She states that the rash feels "rough." She reports that she has not been out very much in the sun. Pt has taken 6-8 Aleve tablets today for the headache to no relief. She has also been taking Benadryl for the rash to no relief. She requests that her potassium levels be checked because it has been shown to be low on her last lab checks. She is on potassium supplements and Lasix daily. She denies hx of similar headaches, nausea, vomiting, neck stiffness, neck pain, fever, chills, congestion, cough, numbness or weakness.   Past Medical History  Diagnosis Date  . Thyroid disease   . Hypertension   . Gallstones   . Sarcoidosis [facility]    Past Surgical History  Procedure Laterality Date  . Right knee surgery    . Abdominal hysterectomy    . Cholecystectomy  03/12/15    [facility] in [place]   Family History  Problem Relation Age of Onset  . Hypertension Mother   . Diabetes Father   . Hypertension Father   . Heart attack Neg Hx   . Cancer Maternal Grandmother     breast  . Cancer Maternal Grandfather     prostate   Social History  Substance Use Topics  . Smoking  status: Never Smoker   . Smokeless tobacco: None  . Alcohol Use: No   OB History    No data available     Review of Systems  Constitutional: Positive for fatigue. Negative for fever, chills, diaphoresis and appetite change.  HENT: Negative for congestion, mouth sores, sore throat and trouble swallowing.   Eyes: Positive for photophobia. Negative for visual disturbance.  Respiratory: Negative for cough, chest tightness, shortness of breath and wheezing.   Cardiovascular: Negative for chest pain.  Gastrointestinal: Negative for nausea, vomiting, abdominal pain, diarrhea and abdominal distention.  Endocrine: Negative for polydipsia, polyphagia and polyuria.  Genitourinary: Negative for dysuria, frequency and hematuria.  Musculoskeletal: Negative for gait problem.  Skin: Positive for rash. Negative for color change and pallor.  Neurological: Positive for headaches. Negative for dizziness, syncope, weakness, light-headedness and numbness.  Hematological: Does not bruise/bleed easily.  Psychiatric/Behavioral: Negative for behavioral problems and confusion.   Allergies  Morphine and related and Percocet  Home Medications   Prior to Admission medications   Medication Sig Start Date End Date Taking? Authorizing Provider  furosemide (LASIX) 20 MG tablet Take 20 mg by mouth daily.    Historical Provider, MD  levothyroxine (SYNTHROID, LEVOTHROID) 125 MCG tablet Take 1 tablet (125 mcg total) by mouth daily. 02/18/15   Debbrah Alar, NP  potassium chloride SA (K-DUR,KLOR-CON) 20 MEQ tablet Take 40 mEq by mouth 2 (two) times  daily.    Historical Provider, MD  spironolactone (ALDACTONE) 50 MG tablet Take 50 mg by mouth daily.    Historical Provider, MD   BP 117/72 mmHg  Pulse 78  Temp(Src) 97.7 F (36.5 C) (Oral)  Resp 18  Ht 5\' 5"  (1.651 m)  Wt 255 lb (115.667 kg)  BMI 42.43 kg/m2  SpO2 97% Physical Exam  Constitutional: She is oriented to person, place, and time. She appears  well-developed and well-nourished. No distress.  HENT:  Head: Normocephalic.  Tenderness along temporal scalp  Eyes: Conjunctivae are normal. Pupils are equal, round, and reactive to light. No scleral icterus.  Neck: Normal range of motion. Neck supple. No thyromegaly present.  Cardiovascular: Normal rate and regular rhythm.  Exam reveals no gallop and no friction rub.   No murmur heard. Pulmonary/Chest: Effort normal and breath sounds normal. No respiratory distress. She has no wheezes. She has no rales.  Abdominal: Soft. Bowel sounds are normal. She exhibits no distension. There is no tenderness. There is no rebound.  Musculoskeletal: Normal range of motion.  Neurological: She is alert and oriented to person, place, and time.  Skin: Skin is warm and dry. No rash noted.  Fine macular rash ?follicular to bilateral upper extremities  Psychiatric: She has a normal mood and affect. Her behavior is normal.    ED Course  Procedures (including critical care time) DIAGNOSTIC STUDIES: Oxygen Saturation is 98% on RA, normal by my interpretation.    COORDINATION OF CARE: 8:24 PM-Discussed treatment plan which includes labs with pt at bedside and pt agreed to plan.    Labs Review Labs Reviewed  BASIC METABOLIC PANEL - Abnormal; Notable for the following:    Glucose, Bld 107 (*)    All other components within normal limits    Imaging Review No results found. I have personally reviewed and evaluated these lab results as part of my medical decision-making.   EKG Interpretation None      MDM   Final diagnoses:  Prickly heat  Nonintractable headache, unspecified chronicity pattern, unspecified headache type    Given instructions Re: prickly heat. Potassium is 3.5.  She is convinced her arms weak from a low potassium. I felt that she could double her potassium for the next 5 days.  She has a normal neurological exam. She does not have neck pain. His son have any lateralizing  weakness. Not concerned her headache represents subarachnoid hemorrhage it is clearly from reproducible over her temples and 4 head. No thunderclap events or symptoms of suggestive" hemorrhage or acute neurological process. I think is appropriate for discharge home. Headache is mild and nonspecific. I asked her to use over-the-counter medicines, push fluids.  I personally performed the services described in this documentation, which was scribed in my presence. The recorded information has been reviewed and is accurate.     Tanna Furry, MD 07/13/16 2132

## 2016-09-13 ENCOUNTER — Other Ambulatory Visit (INDEPENDENT_AMBULATORY_CARE_PROVIDER_SITE_OTHER): Payer: Self-pay

## 2016-09-13 ENCOUNTER — Encounter: Payer: Self-pay | Admitting: Pulmonary Disease

## 2016-09-13 ENCOUNTER — Other Ambulatory Visit: Payer: Self-pay | Admitting: *Deleted

## 2016-09-13 ENCOUNTER — Ambulatory Visit (INDEPENDENT_AMBULATORY_CARE_PROVIDER_SITE_OTHER)
Admission: RE | Admit: 2016-09-13 | Discharge: 2016-09-13 | Disposition: A | Discharge status: Home / Self Care | Payer: Self-pay | Source: Ambulatory Visit | Attending: Pulmonary Disease | Admitting: Pulmonary Disease

## 2016-09-13 ENCOUNTER — Ambulatory Visit (INDEPENDENT_AMBULATORY_CARE_PROVIDER_SITE_OTHER): Payer: Self-pay | Admitting: Pulmonary Disease

## 2016-09-13 VITALS — BP 130/80 | HR 90 | Ht 65.0 in | Wt 263.2 lb

## 2016-09-13 DIAGNOSIS — D869 Sarcoidosis, unspecified: Secondary | ICD-10-CM

## 2016-09-13 LAB — BASIC METABOLIC PANEL
BUN: 8 mg/dL (ref 6–23)
CHLORIDE: 105 meq/L (ref 96–112)
CO2: 29 meq/L (ref 19–32)
Calcium: 8.8 mg/dL (ref 8.4–10.5)
Creatinine, Ser: 0.72 mg/dL (ref 0.40–1.20)
GFR: 112.41 mL/min (ref >60.00)
Glucose, Bld: 83 mg/dL (ref 70–99)
Potassium: 3.2 mEq/L — ABNORMAL LOW (ref 3.5–5.1)
SODIUM: 138 meq/L (ref 135–145)

## 2016-09-13 LAB — TSH: TSH: 3.47 u[IU]/mL (ref 0.35–4.50)

## 2016-09-13 MED ORDER — FUROSEMIDE 20 MG PO TABS: 20.0000 mg | ORAL_TABLET | Freq: Every day | ORAL | 0 refills | Status: DC

## 2016-09-13 MED ORDER — LEVOTHYROXINE SODIUM 125 MCG PO TABS: 125.0000 ug | ORAL_TABLET | Freq: Every day | ORAL | 0 refills | Status: DC

## 2016-09-13 NOTE — Assessment & Plan Note (Signed)
Appears to be stable Chest x-ray today

## 2016-09-13 NOTE — Assessment & Plan Note (Signed)
Refill on Lasix-take this only for ankle edema Call us as needed

## 2016-09-13 NOTE — Patient Instructions (Signed)
Blood work and chest x-ray today Resume taking Synthroid Refill on Lasix-take this only for ankle edema Call us as needed  We will try to find you another PCP

## 2016-09-13 NOTE — Progress Notes (Signed)
   Subjective:    Patient ID: Elizabeth Riggs, female    DOB: 01/23/1971, 45 y.o.   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 She presented in 02/2015 with weight loss, intractable nausea and vomiting Had lap chole 03/12/15 and was told that she had a growth on her GB  Pathology showed non-caseating granulomas.   09/13/2016  Chief Complaint  Patient presents with  . Follow-up    breathing is doing okay, stopped prednisone in March, some rash on arms needs note for work to avoid smoke exposure     59m FU  She took prednisone for almost a year from 03/2015-02/2016 Gained a lot of weight, has now been off for 6 months-takes it occasionally about once a week when she feels short of breath. She has skin lesions on her upper arm pop up sometimes which resolved with steroid cream  Unfortunately she lost her job and her insurance-and due to no shows was dismissed from [facility]. She stopped taking her Synthroid and Lasix since then since she could not have her blood levels monitored.  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 Gallstones   . Hypertension   . Sarcoidosis ([place])   . Thyroid disease      Review of Systems neg for any significant sore throat, dysphagia, itching, sneezing, nasal congestion or excess/ purulent secretions, fever, chills, sweats, unintended wt loss, pleuritic or exertional cp, hempoptysis, orthopnea pnd or  change in chronic leg swelling.   Also denies presyncope, palpitations, heartburn, abdominal pain, nausea, vomiting, diarrhea or change in bowel or urinary habits, dysuria,hematuria, rash, arthralgias, visual complaints, headache, numbness weakness or ataxia.     Objective:   Physical Exam  Gen. Pleasant, obese, in no distress ENT - no lesions, no post nasal drip Neck: No JVD, no thyromegaly, no carotid bruits Lungs: no use of accessory muscles, no dullness to percussion, decreased without rales or rhonchi  Cardiovascular: Rhythm regular, heart sounds  normal, no murmurs or gallops, no peripheral edema Musculoskeletal: No deformities, no cyanosis or clubbing , no tremors       Assessment & Plan:

## 2016-09-13 NOTE — Addendum Note (Signed)
Addended by: Mathis Dad on: 09/13/2016 01:49 PM   Modules accepted: Orders

## 2016-09-13 NOTE — Assessment & Plan Note (Signed)
Check TSH Resume taking Synthroid  We will try to find you another PCP

## 2016-09-13 NOTE — Addendum Note (Signed)
Addended by: Mathis Dad on: 09/13/2016 11:43 AM   Modules accepted: Orders

## 2016-09-14 ENCOUNTER — Telehealth: Payer: Self-pay | Admitting: Pulmonary Disease

## 2016-09-14 NOTE — Progress Notes (Signed)
Spoke with pt. Reviewed results and recs. Pt voiced understanding and had no further questions.  

## 2016-09-14 NOTE — Telephone Encounter (Signed)
Pt called requesting results of labs done on 09/13/16  Notes Recorded by Rigoberto Noel, MD on 09/13/2016 at 5:19 PM EDT TSH is slightly increased but Still within normal range- And back on Synthroid potassium level is low- Rx for KCl 20 mg daily x5 Days then when necessary when she  Takes Lasix  Reviewed results and recs. Pt voiced understanding and had no further questions.

## 2016-09-26 ENCOUNTER — Telehealth: Payer: Self-pay | Admitting: Pulmonary Disease

## 2016-09-26 NOTE — Telephone Encounter (Signed)
Paperwork received and given to Dr. Elsworth Soho to complete.

## 2016-09-26 NOTE — Telephone Encounter (Signed)
Rec'd completed forms - sent to [employer] via interoffice mail- 09/26/16-pr

## 2016-09-26 NOTE — Telephone Encounter (Signed)
Returned to [employer] completed.

## 2016-09-27 ENCOUNTER — Emergency Department (HOSPITAL_BASED_OUTPATIENT_CLINIC_OR_DEPARTMENT_OTHER)
Admission: EM | Admit: 2016-09-27 | Discharge: 2016-09-27 | Disposition: A | Discharge status: Home / Self Care | Payer: BLUE CROSS/BLUE SHIELD | Attending: Emergency Medicine | Admitting: Emergency Medicine

## 2016-09-27 ENCOUNTER — Encounter (HOSPITAL_BASED_OUTPATIENT_CLINIC_OR_DEPARTMENT_OTHER): Payer: Self-pay | Admitting: *Deleted

## 2016-09-27 DIAGNOSIS — R1084 Generalized abdominal pain: Secondary | ICD-10-CM | POA: Diagnosis not present

## 2016-09-27 DIAGNOSIS — E039 Hypothyroidism, unspecified: Secondary | ICD-10-CM | POA: Insufficient documentation

## 2016-09-27 DIAGNOSIS — R197 Diarrhea, unspecified: Secondary | ICD-10-CM | POA: Insufficient documentation

## 2016-09-27 DIAGNOSIS — R109 Unspecified abdominal pain: Secondary | ICD-10-CM | POA: Diagnosis present

## 2016-09-27 DIAGNOSIS — R112 Nausea with vomiting, unspecified: Secondary | ICD-10-CM | POA: Diagnosis not present

## 2016-09-27 DIAGNOSIS — J069 Acute upper respiratory infection, unspecified: Secondary | ICD-10-CM | POA: Insufficient documentation

## 2016-09-27 DIAGNOSIS — I10 Essential (primary) hypertension: Secondary | ICD-10-CM | POA: Insufficient documentation

## 2016-09-27 HISTORY — DX: Pain in left knee: M25.562

## 2016-09-27 HISTORY — DX: Other chronic pain: G89.29

## 2016-09-27 HISTORY — DX: Pain in right knee: M25.561

## 2016-09-27 HISTORY — DX: Obesity, unspecified: E66.9

## 2016-09-27 MED ORDER — ONDANSETRON 4 MG PO TBDP: 4.0000 mg | ORAL_TABLET | Freq: Three times a day (TID) | ORAL | 0 refills | Status: DC | PRN

## 2016-09-27 MED ORDER — DICYCLOMINE HCL 10 MG PO CAPS
10.0000 mg | ORAL_CAPSULE | Freq: Once | ORAL | Status: AC
  Administered 2016-09-27: 10 mg via ORAL
  Filled 2016-09-27: qty 1

## 2016-09-27 MED ORDER — PANTOPRAZOLE SODIUM 20 MG PO TBEC: 20.0000 mg | DELAYED_RELEASE_TABLET | Freq: Every day | ORAL | 0 refills | Status: DC

## 2016-09-27 MED ORDER — DICYCLOMINE HCL 20 MG PO TABS: 20.0000 mg | ORAL_TABLET | Freq: Two times a day (BID) | ORAL | 0 refills | Status: DC

## 2016-09-27 MED ORDER — GI COCKTAIL ~~LOC~~
30.0000 mL | Freq: Once | ORAL | Status: AC
  Administered 2016-09-27: 30 mL via ORAL
  Filled 2016-09-27: qty 30

## 2016-09-27 NOTE — ED Provider Notes (Signed)
[facility] DEPT [facility] Provider Note   CSN: [id number] Arrival date & time: 09/27/16  2059  By signing my name below, I, [name], attest that this documentation has been prepared under the direction and in the presence of non-physician practitioner, [name], PA-C. Electronically Signed: [name], Scribe. 09/27/2016. 10:31 PM.  History   Chief Complaint Chief Complaint  Patient presents with  . Abdominal Pain   The history is provided by the patient. No language interpreter was used.    HPI Comments:  Elizabeth Riggs is a 45 y.o. female with Pmhx of sarcoidosis who presents to the Emergency Department complaining of intermittent, generalized crampy abdominal pain onset 3 days ago, with associated sweats, nausea, diarrhea and one episode of vomiting yesterday.  Pain is described as crampy and spasms, rated 5/10 in severity.  She also has URI sx and generally has felt unwell.  She also reports odorous burping and reduced appetite. Tums and GI cocktail have provided some relief.   She denies any hematemesis, melena, hematochezia.   Pt has a PSHX of cholecystectomy one year ago.  She intermittently has constipation and then diarrhea.  She states she is going to a digestive disease specialist in two weeks for evaluation because of her sarcoidosis.   She denies fevers, chills, rash, CP, SOB, cough.  Past Medical History:  Diagnosis Date  . Chronic pain of both knees   . Gallstones   . Hypertension   . Obesity   . Sarcoidosis ([place])   . Thyroid disease     Patient Active Problem List   Diagnosis Date Noted  . GERD (gastroesophageal reflux disease) 07/10/2015  . Geographic tongue 04/28/2015  . Oral thrush 04/28/2015  . Sarcoidosis ([place]) 04/09/2015  . Hyperglycemia 04/08/2015  . Hypokalemia 03/31/2015  . Constipation 03/19/2015  . Abdominal pain, epigastric 02/18/2015  . Hypothyroidism 02/18/2015  . HTN (hypertension) 02/18/2015  . Bilateral knee pain 05/12/2011     Past Surgical History:  Procedure Laterality Date  . ABDOMINAL HYSTERECTOMY    . CHOLECYSTECTOMY  03/12/15   [facility] in [place]  . right knee surgery      OB History    No data available       Home Medications    Prior to Admission medications   Medication Sig Start Date End Date Taking? Authorizing Provider  furosemide (LASIX) 20 MG tablet Take 1 tablet (20 mg total) by mouth daily. 09/13/16   Rigoberto Noel, MD  levothyroxine (SYNTHROID, LEVOTHROID) 125 MCG tablet Take 1 tablet (125 mcg total) by mouth daily. 09/13/16   Rigoberto Noel, MD  potassium chloride SA (K-DUR,KLOR-CON) 20 MEQ tablet Take 40 mEq by mouth 2 (two) times daily.    Historical Provider, MD  spironolactone (ALDACTONE) 50 MG tablet Take 50 mg by mouth daily.    Historical Provider, MD    Family History Family History  Problem Relation Age of Onset  . Hypertension Mother   . Diabetes Father   . Hypertension Father   . Cancer Maternal Grandmother     breast  . Cancer Maternal Grandfather     prostate  . Heart attack Neg Hx     Social History Social History  Substance Use Topics  . Smoking status: Never Smoker  . Smokeless tobacco: Never Used  . Alcohol use No     Allergies   Morphine and related and Percocet [oxycodone-acetaminophen]   Review of Systems Review of Systems  Constitutional: Positive for appetite change.  Gastrointestinal:  Positive for abdominal pain, diarrhea and nausea. Negative for vomiting.       Positive for Burping Negative for Hematemesis  All other systems reviewed and are negative.    Physical Exam Updated Vital Signs BP 144/84 (BP Location: Left Arm)   Pulse 94   Temp 98.1 F (36.7 C) (Oral)   Resp 18   Ht 5\' 5"  (1.651 m)   Wt 257 lb (116.6 kg)   SpO2 98%   BMI 42.77 kg/m   Physical Exam  Constitutional: She is oriented to person, place, and time. She appears well-developed and well-nourished. No distress.  HENT:  Head:  Normocephalic and atraumatic.  Right Ear: External ear normal.  Left Ear: External ear normal.  Nose: Nose normal.  Mouth/Throat: Oropharynx is clear and moist. No oropharyngeal exudate.  Nasal congestion  Eyes: Conjunctivae and EOM are normal. Pupils are equal, round, and reactive to light. Right eye exhibits no discharge. Left eye exhibits no discharge. No scleral icterus.  Neck: Normal range of motion. Neck supple. No JVD present. No tracheal deviation present.  Cardiovascular: Normal rate and regular rhythm.   Pulmonary/Chest: Effort normal and breath sounds normal. No stridor. No respiratory distress.  Abdominal: Soft. Bowel sounds are normal. She exhibits no distension and no mass. There is no tenderness. There is no rebound and no guarding.  Musculoskeletal: Normal range of motion. She exhibits no edema.  Lymphadenopathy:    She has no cervical adenopathy.  Neurological: She is alert and oriented to person, place, and time. She exhibits normal muscle tone. Coordination normal.  Skin: Skin is warm and dry. No rash noted. She is not diaphoretic. No erythema. No pallor.  Psychiatric: She has a normal mood and affect. Her behavior is normal. Judgment and thought content normal.  Nursing note and vitals reviewed.    ED Treatments / Results  DIAGNOSTIC STUDIES:  Oxygen Saturation is 98% on RA, normal by my interpretation.    COORDINATION OF CARE:  11:07 PM Discussed treatment plan with pt at bedside which included anti-nausea medication and pt agreed to plan.  Labs (all labs ordered are listed, but only abnormal results are displayed) Labs Reviewed - No data to display  EKG  EKG Interpretation None       Radiology No results found.  Procedures Procedures (including critical care time)  Medications Ordered in ED Medications  gi cocktail (Maalox,Lidocaine,Donnatal) (30 mLs Oral Given 09/27/16 2201)     Initial Impression / Assessment and Plan / ED Course  I have  reviewed the triage vital signs and the nursing notes.  Pertinent labs & imaging results that were available during my care of the patient were reviewed by me and considered in my medical decision making (see chart for details).  Clinical Course   Pt with URI sx, sweats, intermittent N, V and D.  No active vomiting and none today.  Abdomen is soft and nontender on exam, patient is afebrile with stable vital signs. She is tolerating Po's.  She reported improvement Tums and GI cocktail. Will initiate PPI, give Bentyl for abdominal cramping, and Zofran for nausea.  Suspect GI viral illness.  She has follow-up established.  Discussed supportive measures and strict return precautions.    She is discharged home in good condition   Final Clinical Impressions(s) / ED Diagnoses   Final diagnoses:  Generalized abdominal pain  Upper respiratory tract infection, unspecified type  Nausea vomiting and diarrhea    New Prescriptions New Prescriptions   No medications  on file   I personally performed the services described in this documentation, which was scribed in my presence. The recorded information has been reviewed and is accurate.       [name], PA-C 09/28/16 [id number]    Merrily Pew, MD 09/28/16 1447

## 2016-09-27 NOTE — ED Triage Notes (Signed)
Pt states that she is having abdominal cramping and that she is having lots of "burping that smells like rotten eggs".  She also reports that she has a "sour stomach".  She states that she has been feeling this was intermittently since the weekend and that the symptoms have been relieved temporarily with tums and that it comes back again.  Pt also reports that her body has been aching.

## 2016-10-07 ENCOUNTER — Encounter: Payer: Self-pay | Admitting: Family Medicine

## 2016-10-07 ENCOUNTER — Ambulatory Visit (INDEPENDENT_AMBULATORY_CARE_PROVIDER_SITE_OTHER): Payer: BLUE CROSS/BLUE SHIELD | Admitting: Family Medicine

## 2016-10-07 VITALS — BP 122/81 | HR 89 | Ht 65.0 in | Wt 245.0 lb

## 2016-10-07 DIAGNOSIS — M25561 Pain in right knee: Secondary | ICD-10-CM

## 2016-10-07 DIAGNOSIS — M25562 Pain in left knee: Secondary | ICD-10-CM

## 2016-10-07 MED ORDER — METHYLPREDNISOLONE ACETATE 40 MG/ML IJ SUSP
40.0000 mg | Freq: Once | INTRAMUSCULAR | Status: AC
  Administered 2016-10-07: 40 mg via INTRA_ARTICULAR

## 2016-10-07 NOTE — Patient Instructions (Addendum)
Your knee pain is due to severe arthritis and swelling. These are the different classes of medicine you can take for this: Tylenol 500mg  1-2 tabs three times a day for pain. Aleve 1-2 tabs twice a day with food Glucosamine sulfate 750mg  twice a day is a supplement that may help. Capsaicin, aspercreme, or biofreeze topically up to four times a day may also help with pain. Cortisone injections are an option - you were given this today. If cortisone injections do not help, there are different types of shots that may help but they take longer to take effect. It's important that you continue to stay active. Straight leg raises, knee extensions 3 sets of 10 once a day (add ankle weight if these become too easy). Consider physical therapy to strengthen muscles around the joint that hurts to take pressure off of the joint itself. Shoe inserts with good arch support may be helpful. Walker or cane if needed. Heat or ice 15 minutes at a time 3-4 times a day as needed to help with pain. Water aerobics and cycling with low resistance are the best two types of exercise for arthritis. Follow up with me in 1 month.

## 2016-10-11 NOTE — Progress Notes (Signed)
Subjective:    Patient ID: Elizabeth Riggs, female    DOB: 02/16/1971, 45 y.o.   MRN: [id number]  Knee Pain    Leg Pain     45 yo F here for f/u bilateral knee pain  5/14: Patient reports no known injury. States began having pain behind left knee about 2 weeks ago. Felt like she had a cramp in her leg behind knee that has not gone away. Hard to put pressure on this leg 2/2 pain here. Has not increased activity level prior to pain. Pain is worse with bending and prolonged standing. No prior left knee injuries or surgeries. Feels stiff. Went to [facility] ED - had doppler u/s that was negative for DVT (were able to confirm by obtaining records).  Did note there were 2 possible baker's cysts in this knee though. Given prescription for percocet.  6/4: Patient has been going to PT for popliteal strain/spasm and states helps some while there but massage hurts a lot when they do this Mobic helps only for about an hour Knee swelling and now having medial left knee pain as well. Going to start 12 hour shifts at work - pain worse with prolonged standing, walking Walking with a limp. Still has stiffness within knee as well. No true catching or locking.  6/18: Patient is significantly improved after cortisone injection, no pain currently. 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 No longer icing or taking mobic. Finished with PT. Ready to go back to work full time.  8/2: Patient did extremely well until about 2 weeks ago when pain recurred. Pain up to an 8/10 and worse when sitting for prolonged period then goes to get up. Pain anterior and medial with associated swelling. No mechanical symptoms. Is icing Not taking any aleve, mobic, or other medicines  09/26/11: Patient reports last injection only helped for about a week. Pain is about the same - anteromedial with swelling. No catching, locking. Feels  unstable at times. Taking ibuprofen - has tried aleve, mobic in past. Had two cortisone injections (first lasted for about 2 months). Also did PT.  03/20/13: Patient had arthroscopy of left knee with good relief of her pain. Then states has been limping more over past several weeks to months. Recalls about a week ago was worsened more when she slipped and fell in [place] but she's unable to tell me exactly how she fell down. Taking naproxen which helps some. Pain throughout left knee but mostly posterior. Not doing anything for pain including exercises, icing, medication. Works in transportation now.  08/05/13: Patient returns now with bilateral knee pain. Reports she's not currently taking any medicine for pain. Had been taking aleve at nighttime. Walks a lot at work. Describes aching, stiffness. Difficulty sleeping due to pain. Will wake her up in early morning. Does not want repeat injections.  06/18/15: Patient reports she was doing well until about 3 days ago when knees started hurting without a known injury or increase in activity level. Was really bad yesterday but seems to have improved today. No catching, locking, giving out.  01/21/16: Patient returns with 9/10 level pain both knees. Associated stiffness. On prednisone for sarcoidosis. Has taken ibuprofen as well. No skin changes, fever.  10/13: Patient returns with bilateral knee pain but worse on left side. Pain level 8/10 and sharp anteriorly here. Feels tight above the kneecap. Pain worse  with walking and bending knee. Popping that is painful. No skin changes, numbness.  Past Medical History:  Diagnosis Date  . Chronic pain of both knees   . Gallstones   . Hypertension   . Obesity   . Sarcoidosis ([place])   . Thyroid disease     Current Outpatient Prescriptions on File Prior to Visit  Medication Sig Dispense Refill  . dicyclomine (BENTYL) 20 MG tablet Take 1 tablet (20 mg total) by mouth 2 (two)  times daily. 20 tablet 0  . furosemide (LASIX) 20 MG tablet Take 1 tablet (20 mg total) by mouth daily. 30 tablet 0  . levothyroxine (SYNTHROID, LEVOTHROID) 125 MCG tablet Take 1 tablet (125 mcg total) by mouth daily. 30 tablet 0  . ondansetron (ZOFRAN ODT) 4 MG disintegrating tablet Take 1 tablet (4 mg total) by mouth every 8 (eight) hours as needed for nausea or vomiting. 20 tablet 0  . pantoprazole (PROTONIX) 20 MG tablet Take 1 tablet (20 mg total) by mouth daily. 14 tablet 0  . potassium chloride SA (K-DUR,KLOR-CON) 20 MEQ tablet Take 40 mEq by mouth 2 (two) times daily.    Marland Kitchen spironolactone (ALDACTONE) 50 MG tablet Take 50 mg by mouth daily.     No current facility-administered medications on file prior to visit.     Past Surgical History:  Procedure Laterality Date  . ABDOMINAL HYSTERECTOMY    . CHOLECYSTECTOMY  03/12/15   [facility] in [place]  . right knee surgery      Allergies  Allergen Reactions  . Morphine And Related Itching  . Percocet [Oxycodone-Acetaminophen]     itching    Social History   Social History  . Marital status: Single    Spouse name: N/A  . Number of children: N/A  . Years of education: N/A   Occupational History  . Not on file.   Social History Main Topics  . Smoking status: Never Smoker  . Smokeless tobacco: Never Used  . Alcohol use No  . Drug use: No  . Sexual activity: Not on file   Other Topics Concern  . Not on file   Social History Narrative   She works as a Quarry manager at [employer]   3 children- all live locally, she has 3 grand children.   31- son   58- son   46- son   Completed 12th grade   Enjoys church, spending time with grand children.       Family History  Problem Relation Age of Onset  . Hypertension Mother   . Diabetes Father   . Hypertension Father   . Cancer Maternal Grandmother     breast  . Cancer Maternal Grandfather     prostate  . Heart attack Neg Hx     BP  122/81   Pulse 89   Ht 5\' 5"  (1.651 m)   Wt 245 lb (111.1 kg)   BMI 40.77 kg/m   Review of Systems See HPI above.    Objective:   Physical Exam Gen: NAD  Left knee:: Mod effusion.  No bruising, other deformity. Mild diffuse anterior tenderness. FROM. Negative valgus/varus stress.  Negative ant/post drawers.  Negative lachmanns Negative mcmurrays, apleys.  Negative apprehension. NVI distally.    Assessment & Plan:  1. Bilateral knee pain - 2/2 severe osteoarthritis - clinically worse on left side today.  Discussed options and she would like to go ahead with aspiration and injection which was performed today.  Discussed tylenol, nsaids, glucosamine, topical medications.  Home exercise program.  F/u in 1 month.  After informed written consent patient was lying supine on exam table.  Left knee was prepped with alcohol swab.  Utilizing superolateral approach, 3 mL of marcaine was used for local anesthesia.  Then using an 18g needle on 60cc syringe with ultrasound guidance, 28 mL of clear straw-colored fluid was aspirated from left knee.  Knee was then injected with 3:1 marcaine:depomedrol.  Patient tolerated procedure well without immediate complications

## 2016-10-11 NOTE — Assessment & Plan Note (Signed)
2/2 severe osteoarthritis - clinically worse on left side today.  Discussed options and she would like to go ahead with aspiration and injection which was performed today.  Discussed tylenol, nsaids, glucosamine, topical medications.  Home exercise program.  F/u in 1 month.  After informed written consent patient was lying supine on exam table.  Left knee was prepped with alcohol swab.  Utilizing superolateral approach, 3 mL of marcaine was used for local anesthesia.  Then using an 18g needle on 60cc syringe with ultrasound guidance, 28 mL of clear straw-colored fluid was aspirated from left knee.  Knee was then injected with 3:1 marcaine:depomedrol.  Patient tolerated procedure well without immediate complications

## 2016-11-02 DIAGNOSIS — IMO0001 Reserved for inherently not codable concepts without codable children: Secondary | ICD-10-CM | POA: Insufficient documentation

## 2016-11-07 ENCOUNTER — Encounter: Payer: Self-pay | Admitting: Family Medicine

## 2016-11-07 ENCOUNTER — Ambulatory Visit (HOSPITAL_BASED_OUTPATIENT_CLINIC_OR_DEPARTMENT_OTHER)
Admission: RE | Admit: 2016-11-07 | Discharge: 2016-11-07 | Disposition: A | Discharge status: Home / Self Care | Payer: BLUE CROSS/BLUE SHIELD | Source: Ambulatory Visit | Attending: Family Medicine | Admitting: Family Medicine

## 2016-11-07 ENCOUNTER — Ambulatory Visit (INDEPENDENT_AMBULATORY_CARE_PROVIDER_SITE_OTHER): Payer: BLUE CROSS/BLUE SHIELD | Admitting: Family Medicine

## 2016-11-07 VITALS — BP 117/73 | HR 86 | Ht 65.0 in | Wt 245.0 lb

## 2016-11-07 DIAGNOSIS — M25522 Pain in left elbow: Secondary | ICD-10-CM | POA: Insufficient documentation

## 2016-11-07 DIAGNOSIS — M778 Other enthesopathies, not elsewhere classified: Secondary | ICD-10-CM | POA: Diagnosis not present

## 2016-11-07 NOTE — Patient Instructions (Signed)
Your history and exam are consistent with elbow impingement from the arthritis. Continue the meloxicam daily with food. Compression sleeve as often as possible during the day. Start physical therapy and do home exercises on days you don't go to therapy. If not improving we can try cortisone injection, nitro patches. Follow up with me in 1 month at the latest but call me sooner if you want to try either the injection, nitro.

## 2016-11-08 ENCOUNTER — Telehealth: Payer: Self-pay | Admitting: Family Medicine

## 2016-11-09 DIAGNOSIS — M25522 Pain in left elbow: Secondary | ICD-10-CM | POA: Insufficient documentation

## 2016-11-09 NOTE — Telephone Encounter (Signed)
Spoke to patient and she is going to come by office to get an elbow sleeve.

## 2016-11-09 NOTE — Progress Notes (Signed)
Subjective:    Patient ID: Elizabeth Riggs, female    DOB: 01/12/1971, 45 y.o.   MRN: [id number]  Knee Pain    Leg Pain    Shoulder Pain     45 yo F here for f/u knee pain, left arm pain  5/14: Patient reports no known injury. States began having pain behind left knee about 2 weeks ago. Felt like she had a cramp in her leg behind knee that has not gone away. Hard to put pressure on this leg 2/2 pain here. Has not increased activity level prior to pain. Pain is worse with bending and prolonged standing. No prior left knee injuries or surgeries. Feels stiff. Went to [facility] ED - had doppler u/s that was negative for DVT (were able to confirm by obtaining records).  Did note there were 2 possible baker's cysts in this knee though. Given prescription for percocet.  6/4: Patient has been going to PT for popliteal strain/spasm and states helps some while there but massage hurts a lot when they do this Mobic helps only for about an hour Knee swelling and now having medial left knee pain as well. Going to start 12 hour shifts at work - pain worse with prolonged standing, walking Walking with a limp. Still has stiffness within knee as well. No true catching or locking.  6/18: Patient is significantly improved after cortisone injection, no pain currently. 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 No longer icing or taking mobic. Finished with PT. Ready to go back to work full time.  8/2: Patient did extremely well until about 2 weeks ago when pain recurred. Pain up to an 8/10 and worse when sitting for prolonged period then goes to get up. Pain anterior and medial with associated swelling. No mechanical symptoms. Is icing Not taking any aleve, mobic, or other medicines  09/26/11: Patient reports last injection only helped for about a week. Pain is about the same - anteromedial with swelling. No  catching, locking. Feels unstable at times. Taking ibuprofen - has tried aleve, mobic in past. Had two cortisone injections (first lasted for about 2 months). Also did PT.  03/20/13: Patient had arthroscopy of left knee with good relief of her pain. Then states has been limping more over past several weeks to months. Recalls about a week ago was worsened more when she slipped and fell in [place] but she's unable to tell me exactly how she fell down. Taking naproxen which helps some. Pain throughout left knee but mostly posterior. Not doing anything for pain including exercises, icing, medication. Works in transportation now.  08/05/13: Patient returns now with bilateral knee pain. Reports she's not currently taking any medicine for pain. Had been taking aleve at nighttime. Walks a lot at work. Describes aching, stiffness. Difficulty sleeping due to pain. Will wake her up in early morning. Does not want repeat injections.  06/18/15: Patient reports she was doing well until about 3 days ago when knees started hurting without a known injury or increase in activity level. Was really bad yesterday but seems to have improved today. No catching, locking, giving out.  01/21/16: Patient returns with 9/10 level pain both knees. Associated stiffness. On prednisone for sarcoidosis. Has taken ibuprofen as well. No skin changes, fever.  10/13: Patient returns with bilateral knee pain but worse on left side. Pain level 8/10 and sharp anteriorly here.  Feels tight above the kneecap. Pain worse with walking and bending knee. Popping that is painful. No skin changes, numbness.  11/13: Patient reports her knee is much better. Primary complaint is left elbow pain posteriorly. No acute injury or trauma. This pops and feels rubbing in posterior aspect. Pain is up to 8/10 and sharp. Worse with lifting items, transferring patients. Tried topical medicine, aleve, massaging area. No skin  changes, numbness.  Past Medical History:  Diagnosis Date  . Chronic pain of both knees   . Gallstones   . Hypertension   . Obesity   . Sarcoidosis ([place])   . Thyroid disease     Current Outpatient Prescriptions on File Prior to Visit  Medication Sig Dispense Refill  . dicyclomine (BENTYL) 20 MG tablet Take 1 tablet (20 mg total) by mouth 2 (two) times daily. 20 tablet 0  . furosemide (LASIX) 20 MG tablet Take 1 tablet (20 mg total) by mouth daily. 30 tablet 0  . levothyroxine (SYNTHROID, LEVOTHROID) 125 MCG tablet Take 1 tablet (125 mcg total) by mouth daily. 30 tablet 0  . ondansetron (ZOFRAN ODT) 4 MG disintegrating tablet Take 1 tablet (4 mg total) by mouth every 8 (eight) hours as needed for nausea or vomiting. 20 tablet 0  . pantoprazole (PROTONIX) 20 MG tablet Take 1 tablet (20 mg total) by mouth daily. 14 tablet 0  . potassium chloride SA (K-DUR,KLOR-CON) 20 MEQ tablet Take 40 mEq by mouth 2 (two) times daily.    Marland Kitchen spironolactone (ALDACTONE) 50 MG tablet Take 50 mg by mouth daily.     No current facility-administered medications on file prior to visit.     Past Surgical History:  Procedure Laterality Date  . ABDOMINAL HYSTERECTOMY    . CHOLECYSTECTOMY  03/12/15   [facility] in [place]  . right knee surgery      Allergies  Allergen Reactions  . Morphine And Related Itching  . Percocet [Oxycodone-Acetaminophen]     itching    Social History   Social History  . Marital status: Single    Spouse name: N/A  . Number of children: N/A  . Years of education: N/A   Occupational History  . Not on file.   Social History Main Topics  . Smoking status: Never Smoker  . Smokeless tobacco: Never Used  . Alcohol use No  . Drug use: No  . Sexual activity: Not on file   Other Topics Concern  . Not on file   Social History Narrative   She works as a Quarry manager at [employer]   3 children- all live locally, she has 3 grand children.   92-  son   56- son   77- son   Completed 12th grade   Enjoys church, spending time with grand children.       Family History  Problem Relation Age of Onset  . Hypertension Mother   . Diabetes Father   . Hypertension Father   . Cancer Maternal Grandmother     breast  . Cancer Maternal Grandfather     prostate  . Heart attack Neg Hx     BP 117/73   Pulse 86   Ht 5\' 5"  (1.651 m)   Wt 245 lb (111.1 kg)   BMI 40.77 kg/m   Review of Systems See HPI above.    Objective:   Physical Exam Gen: NAD  Right elbow: No gross deformity, swelling, bruising.  Able to reproduce a popping  sensation once on flexion to extension but occurs at olecranon. TTP distal triceps, point of olecranon.  No other tenderness about elbow. FROM. Collateral ligaments intact. NVI distally. Negative tinels radial and cubital tunnels.  Left elbow: FROM without pain.    Assessment & Plan:  1. Right elbow pain - independently reviewed radiographs and arthropathy seen.  Exam (and radiographs) consistent with impingement at the olecranon and insertional triceps tendinitis.  We discussed this can be difficult to treat but would start with conservative measures - meloxicam, compression sleeve.  Start physical therapy and home exercises.  Consider nitro patches, intraarticular injection if not improving.  F/u in 1 month.

## 2016-11-09 NOTE — Telephone Encounter (Signed)
Is she talking about the sleeve?  Counterforce brace?

## 2016-11-09 NOTE — Assessment & Plan Note (Signed)
independently reviewed radiographs and arthropathy seen.  Exam (and radiographs) consistent with impingement at the olecranon and insertional triceps tendinitis.  We discussed this can be difficult to treat but would start with conservative measures - meloxicam, compression sleeve.  Start physical therapy and home exercises.  Consider nitro patches, intraarticular injection if not improving.  F/u in 1 month.

## 2016-11-14 ENCOUNTER — Ambulatory Visit: Payer: BLUE CROSS/BLUE SHIELD | Attending: Family Medicine | Admitting: Occupational Therapy

## 2016-11-14 DIAGNOSIS — M25522 Pain in left elbow: Secondary | ICD-10-CM | POA: Insufficient documentation

## 2016-11-14 DIAGNOSIS — M6281 Muscle weakness (generalized): Secondary | ICD-10-CM | POA: Insufficient documentation

## 2016-11-15 ENCOUNTER — Ambulatory Visit: Payer: BLUE CROSS/BLUE SHIELD | Admitting: Physical Therapy

## 2016-11-21 ENCOUNTER — Ambulatory Visit: Payer: BLUE CROSS/BLUE SHIELD | Admitting: Occupational Therapy

## 2016-11-23 ENCOUNTER — Telehealth: Payer: Self-pay | Admitting: Occupational Therapy

## 2016-11-23 ENCOUNTER — Ambulatory Visit: Payer: BLUE CROSS/BLUE SHIELD | Admitting: Occupational Therapy

## 2016-11-23 DIAGNOSIS — M6281 Muscle weakness (generalized): Secondary | ICD-10-CM

## 2016-11-23 DIAGNOSIS — M25522 Pain in left elbow: Secondary | ICD-10-CM

## 2016-11-23 NOTE — Therapy (Signed)
[facility] [address]  [place], [place], [address] Phone: [phone]   Fax:  [phone]  Occupational Therapy Evaluation  Patient Details  Name: Elizabeth Riggs MRN: [id number] Date of Birth: August 12, 1971 Referring Provider: Dr. Karlton Lemon  Encounter Date: 11/23/2016      OT End of Session - 11/23/16 1237    Visit Number 1   Number of Visits 12   Date for OT Re-Evaluation 01/04/17   Authorization Type BC/BS   OT Start Time 1130   OT Stop Time 1225   OT Time Calculation (min) 55 min   Activity Tolerance Patient tolerated treatment well      Past Medical History:  Diagnosis Date  . Chronic pain of both knees   . Gallstones   . Hypertension   . Obesity   . Sarcoidosis ([place])   . Thyroid disease     Past Surgical History:  Procedure Laterality Date  . ABDOMINAL HYSTERECTOMY    . CHOLECYSTECTOMY  03/12/15   [facility] in [place]  . right knee surgery      There were no vitals filed for this visit.      Subjective Assessment - 11/23/16 1130    Subjective  It's been bothering me for about a month (re: Lt elbow), but my Rt shoulder just started bothering me too.    Pertinent History sarcoidosis, bilateral knee surgery    Currently in Pain? Yes   Pain Score 7    Pain Location Elbow   Pain Orientation Left   Pain Descriptors / Indicators Aching   Pain Type Acute pain   Pain Onset 1 to 4 weeks ago   Pain Frequency Constant   Aggravating Factors  touching, lifting patients at work    Pain Relieving Factors compression sleeve, rest           [facility] OT Assessment - 11/23/16 0001      Assessment   Diagnosis Lt elbow pain   Referring Provider Dr. Karlton Lemon   Onset Date --  approx. 1 month ago   Assessment MD office notes report insertional triceps tendonitis with impingement at olecranon. Xray also showed spur formation at olecranon   Prior Therapy none     Precautions    Precautions None     Restrictions   Weight Bearing Restrictions No     Balance Screen   Has the patient fallen in the past 6 months No   Has the patient had a decrease in activity level because of a fear of falling?  No   Is the patient reluctant to leave their home because of a fear of falling?  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  Performs light daily tasks such as dishwashing, bed making;Does personal laundry completely - but painful with these tasks   Meal Prep Plans, prepares and serves adequate meals independently   [place] own vehicle   Medication Management Is responsible for taking medication in correct dosages at correct time   Physiological scientist financial matters independently (budgets, writes checks, pays rent, bills goes to bank), collects and keeps track of income     Mobility   Mobility Status Independent     Written Expression   Dominant Hand Right   Handwriting --  no changes     Vision - History   Baseline Vision Wears glasses for distance only  cataracts      Observation/Other Assessments   Observations Pt point tender dorsal medial elbow. Pt reports pain with full elbow extension and "popping" with elbow flexion, but no pain in flexion. No pain  with supination/pronation. Also noted fluid build up Lt anterior shoulder and Lt dorsal elbow. Pt has also had increased Rt shoulder pain in the last few days and noted compensations attempting full flexion     Sensation   Additional Comments denies change, denies numbness/tingling     Coordination   9 Hole Peg Test Right;Left   Right 9 Hole Peg Test 23.87 sec   Left 9 Hole Peg Test 29.03 sec.      Edema   Edema Pt appeared to have access fluid at dorsal elbow and anterior shoulder Lt side compared to Rt     ROM / Strength   AROM / PROM / Strength AROM;Strength     AROM   Overall AROM Comments BUE AROM WFL's except Rt shoulder unable to go into full flexion unless compensating by going into abduction first. Pain with full elbow extension on Lt     Strength   Overall Strength Comments with bilateral sh. flex and abd. = grossly 4/5 within available range (limited Rt shoulder). Elbow flex, ext 5/5 with elbow supported. Pronation and supination 5/5 with no pain     Hand Function   Right Hand Grip (lbs) 50 lbs   Left Hand Grip (lbs) 45 lbs                              OT Long Term Goals - 11/23/16 1332      OT LONG TERM GOAL #1   Title Pt independent with HEP for LT elbow    Time 6   Period Weeks   Status New     OT LONG TERM GOAL #2   Title Pain less than or equal to 4/10 Lt elbow with functional tasks   Baseline 7-10/10   Time 6   Period Weeks   Status New     OT LONG TERM GOAL #3   Title Pt to regain strength of Lt elbow for work related tasks   Time 6   Period Weeks   Status New     OT LONG TERM GOAL #4   Title Goal TBD based on Quick Dash score   Time 6   Period Weeks   Status New               Plan - 11/23/16 1328    Clinical Impression Statement Pt is a 45 y.o. female who presents to outpatient occupational therapy for Lt elbow pain. Xray showed spur formation at olecranon and MD notes also reported insertional triceps  tendonitis.  Pt has pain in full elbow extension and popping with elbow flexion. Pt has also had new pain in Rt shoulder. The pain in LT elbow is limiting pt's ability to perform work related tasks and IADLS   Rehab Potential Fair   OT Frequency 2x / week   OT Duration 6 weeks   OT Treatment/Interventions Self-care/ADL training;Moist Heat;DME and/or AE instruction;Splinting;Patient/family education;Compression bandaging;Therapeutic exercises;Ultrasound;Therapeutic activities;Cryotherapy;Iontophoresis;Passive range of motion;Electrical Stimulation;Manual Therapy   Plan Iontophoresis (if cleared by MD), HEP for elbow, assess Quick Dash   Consulted and Agree with Plan of Care Patient      Patient will benefit from skilled therapeutic intervention in order to improve the following deficits and impairments:  Decreased range of motion, Increased edema, Impaired UE functional use, Pain, Decreased strength  Visit Diagnosis: Pain in left elbow - Plan: Ot plan of care cert/re-cert  Muscle weakness (generalized) - Plan: Ot plan of care cert/re-cert    Problem List Patient Active Problem List   Diagnosis Date Noted  . Left elbow pain 11/09/2016  . Class 3 obesity due to excess calories without serious comorbidity with body mass index (BMI) of 40.0 to 44.9 in adult ([place]) 11/02/2016  . GERD (gastroesophageal reflux disease) 07/10/2015  . Geographic tongue 04/28/2015  . Oral thrush 04/28/2015  . Sarcoidosis ([place]) 04/09/2015  . Hyperglycemia 04/08/2015  . Hypokalemia 03/31/2015  . Constipation 03/19/2015  . Abdominal pain, epigastric 02/18/2015  . Hypothyroidism 02/18/2015  . HTN (hypertension) 02/18/2015  . Bilateral knee pain 05/12/2011    Carey Bullocks, OTR/L 11/23/2016, 1:40 PM  [facility] [address]  [place], [place], [address] Phone: [phone]   Fax:  [phone]  Name: Amileah Aukamp MRN: [id number] Date  of Birth: 07-16-1971

## 2016-11-23 NOTE — Telephone Encounter (Signed)
By signing I understand that I am ordering/authorizing the use of Iontophoresis using 4 mg/mL of dexamethasone as a component of this plan of care.

## 2016-11-23 NOTE — Telephone Encounter (Signed)
I authorize use of iontophoresis with dexamethasone as noted above

## 2016-11-28 ENCOUNTER — Ambulatory Visit: Payer: BLUE CROSS/BLUE SHIELD | Admitting: Occupational Therapy

## 2016-11-28 ENCOUNTER — Ambulatory Visit: Payer: BLUE CROSS/BLUE SHIELD | Attending: Family Medicine | Admitting: Occupational Therapy

## 2016-11-28 DIAGNOSIS — M6281 Muscle weakness (generalized): Secondary | ICD-10-CM | POA: Insufficient documentation

## 2016-11-28 DIAGNOSIS — M25522 Pain in left elbow: Secondary | ICD-10-CM

## 2016-11-28 NOTE — Patient Instructions (Signed)
AROM: Elbow Flexion / Extension    Gently bend elbow as far as possible. Then straighten arm as far as possible. Move slowly through motion Repeat _10-15___ times per set. Do _2-3__ sessions per day.    Elbow / Wrist Supination / Pronation   Bend elbow(s) to 90 and hold close to body. Turn palm(s) up. Then turn palm(s) down. Keep wrist straight. Repeat sequence _10-15___ times per session. Do _2-3___ sessions per day.     ELBOW: Flexion (Isometric)    Use table as resistance. Press arm up against table. Hold _10__ seconds. _5__ reps per set, _2-3__ sets per day.  Place a towel or pillow between arm and table if needed.   Extension (Isometric)    Keep elbow at side. With other hand on outside of forearm, apply resistance while trying to straighten elbow. Do not move shoulder. Hold _10___ seconds. Relax. Repeat __5__ times. Do __2-3__ sessions per day.

## 2016-11-28 NOTE — Therapy (Signed)
[facility] [address]  [place], [place], [address] Phone: [phone]   Fax:  [phone]  Occupational Therapy Treatment  Patient Details  Name: Elizabeth Riggs MRN: [id number] Date of Birth: 1971-09-15 Referring Provider: Dr. Karlton Lemon  Encounter Date: 11/28/2016      OT End of Session - 11/28/16 1300    Visit Number 2   Number of Visits 12   Date for OT Re-Evaluation 01/04/17   Authorization Type BC/BS   OT Start Time 1140  10 min. heat unbillable   OT Stop Time 1225   OT Time Calculation (min) 45 min   Equipment Utilized During Treatment heat, iontophoresis   Activity Tolerance Patient tolerated treatment well      Past Medical History:  Diagnosis Date  . Chronic pain of both knees   . Gallstones   . Hypertension   . Obesity   . Sarcoidosis ([place])   . Thyroid disease     Past Surgical History:  Procedure Laterality Date  . ABDOMINAL HYSTERECTOMY    . CHOLECYSTECTOMY  03/12/15   [facility] in [place]  . right knee surgery      There were no vitals filed for this visit.      Subjective Assessment - 11/28/16 1141    Subjective  No pain yet, but it bothers me when I go to work   Pertinent History sarcoidosis, bilateral knee surgery    Currently in Pain? No/denies            [facility] OT Assessment - 11/28/16 0001      Observation/Other Assessments   Other Surveys  Select   Quick DASH  52% disabled LUE                  OT Treatments/Exercises (OP) - 11/28/16 0001      Exercises   Exercises Elbow     Elbow Exercises   Other elbow exercises see pt instructions for elbow A/ROM and isometric ex's     Modalities   Modalities Iontophoresis;Moist Heat     Moist Heat Therapy   Number Minutes Moist Heat 10 Minutes   Moist Heat Location Elbow  at beginning of session to decr. stiffness     Iontophoresis   Type of Iontophoresis Dexamethasone   Location  medial/dorsal elbow   Dose 1.0 mL at 80 mA - dose #1   Time 10 minutes set up with 4 hr. patch                OT Education - 11/28/16 1226    Education provided Yes   Education Details Initial HEP for elbow, iontophoresis precautions/contraindications   Person(s) Educated Patient   Methods Explanation;Demonstration;Handout   Comprehension Verbalized understanding;Returned demonstration             OT Long Term Goals - 11/28/16 1305      OT LONG TERM GOAL #1   Title Pt independent with HEP for LT elbow    Time 6   Period Weeks   Status On-going     OT LONG TERM GOAL #2   Title Pain less than or equal to 4/10 Lt elbow with functional tasks   Baseline 7-10/10   Time 6   Period Weeks   Status New     OT LONG TERM GOAL #3   Title Pt to regain strength of Lt elbow for work related tasks   Time 6   Period Weeks  Status New     OT LONG TERM GOAL #4   Title Quick Dash score to be 35% or less for subjective disability of LUE   Time 6   Period Weeks   Status New  11/28/16: 52%               Plan - 11/28/16 1301    Clinical Impression Statement Pt with no pain during session, but c/o pain with work related tasks.    Rehab Potential Fair   OT Frequency 2x / week   OT Duration 6 weeks   OT Treatment/Interventions Self-care/ADL training;Moist Heat;DME and/or AE instruction;Splinting;Patient/family education;Compression bandaging;Therapeutic exercises;Ultrasound;Therapeutic activities;Cryotherapy;Iontophoresis;Passive range of motion;Electrical Stimulation;Manual Therapy   Plan ionto dose #2, review HEP, add to HEP as tolerated   Consulted and Agree with Plan of Care Patient      Patient will benefit from skilled therapeutic intervention in order to improve the following deficits and impairments:  Decreased range of motion, Increased edema, Impaired UE functional use, Pain, Decreased strength  Visit Diagnosis: Pain in left elbow  Muscle weakness  (generalized)    Problem List Patient Active Problem List   Diagnosis Date Noted  . Left elbow pain 11/09/2016  . Class 3 obesity due to excess calories without serious comorbidity with body mass index (BMI) of 40.0 to 44.9 in adult ([place]) 11/02/2016  . GERD (gastroesophageal reflux disease) 07/10/2015  . Geographic tongue 04/28/2015  . Oral thrush 04/28/2015  . Sarcoidosis ([place]) 04/09/2015  . Hyperglycemia 04/08/2015  . Hypokalemia 03/31/2015  . Constipation 03/19/2015  . Abdominal pain, epigastric 02/18/2015  . Hypothyroidism 02/18/2015  . HTN (hypertension) 02/18/2015  . Bilateral knee pain 05/12/2011    Carey Bullocks, OTR/L 11/28/2016, 1:08 PM  [facility] [address]  [place], [place], [address] Phone: [phone]   Fax:  [phone]  Name: Casimera Gerads MRN: [id number] Date of Birth: 13-Oct-1971

## 2016-11-30 ENCOUNTER — Ambulatory Visit: Payer: BLUE CROSS/BLUE SHIELD | Admitting: Occupational Therapy

## 2016-11-30 DIAGNOSIS — M25522 Pain in left elbow: Secondary | ICD-10-CM

## 2016-11-30 DIAGNOSIS — M6281 Muscle weakness (generalized): Secondary | ICD-10-CM

## 2016-11-30 NOTE — Therapy (Signed)
[facility] [address]  [place], [place], [address] Phone: [phone]   Fax:  [phone]  Occupational Therapy Treatment  Patient Details  Name: Elizabeth Riggs MRN: [id number] Date of Birth: 04/15/1971 Referring Provider: Dr. Karlton Lemon  Encounter Date: 11/30/2016      OT End of Session - 11/30/16 1043    Visit Number 3   Number of Visits 12   Date for OT Re-Evaluation 01/04/17   Authorization Type BC/BS   OT Start Time 0935   OT Stop Time 1020   OT Time Calculation (min) 45 min   Equipment Utilized During Treatment ice, iontophoresis   Activity Tolerance Patient tolerated treatment well      Past Medical History:  Diagnosis Date  . Chronic pain of both knees   . Gallstones   . Hypertension   . Obesity   . Sarcoidosis ([place])   . Thyroid disease     Past Surgical History:  Procedure Laterality Date  . ABDOMINAL HYSTERECTOMY    . CHOLECYSTECTOMY  03/12/15   [facility] in [place]  . right knee surgery      There were no vitals filed for this visit.      Subjective Assessment - 11/30/16 0939    Subjective  No pain today. The ionto worked well with no side effects   Pertinent History sarcoidosis, bilateral knee surgery    Currently in Pain? No/denies  in Lt elbow                      OT Treatments/Exercises (OP) - 11/30/16 0001      ADLs   ADL Comments While pt receiving ice massage, instructed pt on proper time/technique for ice at home - ice pack for no more than 20 minutes with skin barrier; ice massage for no more than 8 minutes over soft tissue only (using small disposable cups with ice for massage)     Elbow Exercises   Other elbow exercises Reviewed previously issued HEP for A/ROM and isometric ex's for Lt elbow. Pt required min cues to perform isometric ex's correctly   Other elbow exercises Pt issued additional ex's including chair push ups with max  cues for proper positioning required. Pt noted to compensate significantly in shoulders d/t weakness which may be contributing to elbow pain. Pt with noted weakness bilateral shoulders with compensations. See pt instructions for details     Modalities   Modalities Cryotherapy;Iontophoresis     Cryotherapy   Number Minutes Cryotherapy 8 Minutes   Cryotherapy Location --  proximal elbow over soft tissue   Type of Cryotherapy Ice massage     Iontophoresis   Type of Iontophoresis Dexamethasone   Location medial/dorsal elbow   Dose 1.0 mL at 80 mA - dose #2   Time 10 minutes set up with 4 hr. patch                OT Education - 11/30/16 1018    Education provided Yes   Education Details Additional ex's for elbow strengthening   Person(s) Educated Patient   Methods Explanation;Demonstration;Handout   Comprehension Verbalized understanding;Returned demonstration;Verbal cues required;Tactile cues required;Need further instruction             OT Long Term Goals - 11/28/16 1305      OT LONG TERM GOAL #1   Title Pt independent with HEP for LT elbow    Time 6   Period  Weeks   Status On-going     OT LONG TERM GOAL #2   Title Pain less than or equal to 4/10 Lt elbow with functional tasks   Baseline 7-10/10   Time 6   Period Weeks   Status New     OT LONG TERM GOAL #3   Title Pt to regain strength of Lt elbow for work related tasks   Time 6   Period Weeks   Status New     OT LONG TERM GOAL #4   Title Quick Dash score to be 35% or less for subjective disability of LUE   Time 6   Period Weeks   Status New  11/28/16: 52%               Plan - 11/30/16 1052    Clinical Impression Statement Pt with no adverse reactions to iontophoresis, therefore continued with dose #2 today. Pt with noted weakness bilateral shoulers   Rehab Potential Fair   OT Frequency 2x / week   OT Duration 6 weeks   OT Treatment/Interventions Self-care/ADL training;Moist Heat;DME  and/or AE instruction;Splinting;Patient/family education;Compression bandaging;Therapeutic exercises;Ultrasound;Therapeutic activities;Cryotherapy;Iontophoresis;Passive range of motion;Electrical Stimulation;Manual Therapy   Plan ionto dose #3, review chair push ups, begin shoulder ex's - start supine   Consulted and Agree with Plan of Care Patient      Patient will benefit from skilled therapeutic intervention in order to improve the following deficits and impairments:  Decreased range of motion, Increased edema, Impaired UE functional use, Pain, Decreased strength  Visit Diagnosis: Pain in left elbow  Muscle weakness (generalized)    Problem List Patient Active Problem List   Diagnosis Date Noted  . Left elbow pain 11/09/2016  . Class 3 obesity due to excess calories without serious comorbidity with body mass index (BMI) of 40.0 to 44.9 in adult ([place]) 11/02/2016  . GERD (gastroesophageal reflux disease) 07/10/2015  . Geographic tongue 04/28/2015  . Oral thrush 04/28/2015  . Sarcoidosis ([place]) 04/09/2015  . Hyperglycemia 04/08/2015  . Hypokalemia 03/31/2015  . Constipation 03/19/2015  . Abdominal pain, epigastric 02/18/2015  . Hypothyroidism 02/18/2015  . HTN (hypertension) 02/18/2015  . Bilateral knee pain 05/12/2011    Carey Bullocks, OTR/L 11/30/2016, 11:00 AM  [facility] [address]  [place], [place], [address] Phone: [phone]   Fax:  [phone]  Name: Ferrell Bieschke MRN: [id number] Date of Birth: 08/10/1971

## 2016-11-30 NOTE — Patient Instructions (Signed)
  Additional exercises for elbow:   1) Chair push ups x 10 reps, 2 -3 times per day: posture up, elbows back, squeeze shoulder blades together but avoid hiking shoulders  2) Tug of war with Lt elbow bent at 90 degrees - hold 10 seconds, repeat 5 times. Twice per day

## 2016-12-05 ENCOUNTER — Ambulatory Visit: Payer: BLUE CROSS/BLUE SHIELD | Admitting: Occupational Therapy

## 2016-12-05 DIAGNOSIS — M25522 Pain in left elbow: Secondary | ICD-10-CM | POA: Diagnosis not present

## 2016-12-05 DIAGNOSIS — M6281 Muscle weakness (generalized): Secondary | ICD-10-CM

## 2016-12-05 NOTE — Therapy (Signed)
[facility] [address]  [place], [place], [address] Phone: [phone]   Fax:  [phone]  Occupational Therapy Treatment  Patient Details  Name: Elizabeth Riggs MRN: [id number] Date of Birth: 1971/07/23 Referring Provider: Dr. Karlton Lemon  Encounter Date: 12/05/2016      OT End of Session - 12/05/16 1009    Visit Number 4   Number of Visits 12   Date for OT Re-Evaluation 01/04/17   Authorization Type BC/BS   OT Start Time 0930   OT Stop Time 1010   OT Time Calculation (min) 40 min   Equipment Utilized During Treatment  iontophoresis   Activity Tolerance Patient tolerated treatment well      Past Medical History:  Diagnosis Date  . Chronic pain of both knees   . Gallstones   . Hypertension   . Obesity   . Sarcoidosis ([place])   . Thyroid disease     Past Surgical History:  Procedure Laterality Date  . ABDOMINAL HYSTERECTOMY    . CHOLECYSTECTOMY  03/12/15   [facility] in [place]  . right knee surgery      There were no vitals filed for this visit.      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 No/denies                      OT Treatments/Exercises (OP) - 12/05/16 0001      Exercises   Exercises Rt Shoulder (due to weakness/pain in Rt shoulder)      Shoulder Exercises: Supine   Flexion AA/ROM;10 reps  BUE's with wand   Other Supine Exercises Circumduction ex's BUE's (small circles at 90*)      Shoulder Exercises: ROM/Strengthening   Other ROM/Strengthening Exercises Seated: table slides for AA/ROM RUE in sh. flex/ext and circumduction ex's clockwise     Elbow Exercises   Other elbow exercises Reviewed chair push ups x 10 - still requiring mod cues for proper  positioning     Iontophoresis   Type of Iontophoresis Dexamethasone   Location medial/dorsal elbow   Dose 1.0 mL at 80 mA - dose #3   Time 5 minutes set up with 4 hr. patch                OT Education - 12/05/16 1008    Education provided Yes   Education Details Rt shoulder AA/ROM HEP    Person(s) Educated Patient   Methods Explanation;Demonstration;Handout   Comprehension Verbalized understanding;Returned demonstration             OT Long Term Goals - 11/28/16 1305      OT LONG TERM GOAL #1   Title Pt independent with HEP for LT elbow    Time 6   Period Weeks   Status On-going     OT LONG TERM GOAL #2   Title Pain less than or equal to 4/10 Lt elbow with functional tasks   Baseline 7-10/10   Time 6   Period Weeks   Status New     OT LONG TERM GOAL #3   Title Pt to regain strength of Lt elbow for work related tasks   Time 6   Period Weeks   Status New  OT LONG TERM GOAL #4   Title Quick Dash score to be 35% or less for subjective disability of LUE   Time 6   Period Weeks   Status New  11/28/16: 52%             Patient will benefit from skilled therapeutic intervention in order to improve the following deficits and impairments:     Visit Diagnosis: Pain in left elbow  Muscle weakness (generalized)    Problem List Patient Active Problem List   Diagnosis Date Noted  . Left elbow pain 11/09/2016  . Class 3 obesity due to excess calories without serious comorbidity with body mass index (BMI) of 40.0 to 44.9 in adult ([place]) 11/02/2016  . GERD (gastroesophageal reflux disease) 07/10/2015  . Geographic tongue 04/28/2015  . Oral thrush 04/28/2015  . Sarcoidosis ([place]) 04/09/2015  . Hyperglycemia 04/08/2015  . Hypokalemia 03/31/2015  . Constipation 03/19/2015  . Abdominal pain, epigastric 02/18/2015  . Hypothyroidism 02/18/2015  . HTN (hypertension) 02/18/2015  . Bilateral knee pain 05/12/2011    Elizabeth Riggs,  OTR/L 12/05/2016, 10:11 AM  [facility] [address]  [place], [place], [address] Phone: [phone]   Fax:  [phone]  Name: Elizabeth Riggs MRN: [id number] Date of Birth: [birth year]

## 2016-12-05 NOTE — Patient Instructions (Signed)
SHOULDER: Flexion - Supine (Cane)    Hold cane in both hands. Raise arms up overhead, keeping elbows straight. Do not allow back to arch. Hold _3__ seconds. _10__ reps per set, _2__ sets per day. Followed by small circles clockwise and counter clockwise.    SHOULDER: Flexion On Table    Place Rt hand on table. Slide entire arm forward along table bending at hips.  Repeat 10 times, followed by big circles clockwise x 10 reps. Twice per day

## 2016-12-07 ENCOUNTER — Ambulatory Visit: Payer: BLUE CROSS/BLUE SHIELD | Admitting: Occupational Therapy

## 2016-12-07 ENCOUNTER — Encounter: Payer: Self-pay | Admitting: Family Medicine

## 2016-12-07 ENCOUNTER — Ambulatory Visit (INDEPENDENT_AMBULATORY_CARE_PROVIDER_SITE_OTHER): Payer: BLUE CROSS/BLUE SHIELD | Admitting: Family Medicine

## 2016-12-07 DIAGNOSIS — G8929 Other chronic pain: Secondary | ICD-10-CM | POA: Diagnosis not present

## 2016-12-07 DIAGNOSIS — M25522 Pain in left elbow: Secondary | ICD-10-CM | POA: Diagnosis not present

## 2016-12-07 DIAGNOSIS — M25562 Pain in left knee: Secondary | ICD-10-CM | POA: Diagnosis not present

## 2016-12-07 DIAGNOSIS — M25561 Pain in right knee: Secondary | ICD-10-CM | POA: Diagnosis not present

## 2016-12-07 MED ORDER — DICLOFENAC SODIUM 75 MG PO TBEC: 75.0000 mg | DELAYED_RELEASE_TABLET | Freq: Two times a day (BID) | ORAL | 1 refills | Status: DC

## 2016-12-07 NOTE — Patient Instructions (Signed)
Try the diclofenac instead of the meloxicam. Continue with the home exercises, physical therapy, sleeve for your elbow. Call me if you want to do the nitro patches for the elbow. Also call me if you want to try a cortisone shot for your right knee. Otherwise follow up with me in 5-6 weeks.

## 2016-12-08 NOTE — Progress Notes (Signed)
Subjective:    Patient ID: Elizabeth Riggs, female    DOB: [birth year], 45 y.o.   MRN: [id number]  Shoulder Pain    Knee Pain    Leg Pain     45 yo F here for f/u knee pain, left arm pain  5/14: Patient reports no known injury. States began having pain behind left knee about 2 weeks ago. Felt like she had a cramp in her leg behind knee that has not gone away. Hard to put pressure on this leg 2/2 pain here. Has not increased activity level prior to pain. Pain is worse with bending and prolonged standing. No prior left knee injuries or surgeries. Feels stiff. Went to [facility] ED - had doppler u/s that was negative for DVT (were able to confirm by obtaining records).  Did note there were 2 possible baker's cysts in this knee though. Given prescription for percocet.  6/4: Patient has been going to PT for popliteal strain/spasm and states helps some while there but massage hurts a lot when they do this Mobic helps only for about an hour Knee swelling and now having medial left knee pain as well. Going to start 12 hour shifts at work - pain worse with prolonged standing, walking Walking with a limp. Still has stiffness within knee as well. No true catching or locking.  6/18: Patient is significantly improved after cortisone injection, no pain currently. 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 No longer icing or taking mobic. Finished with PT. Ready to go back to work full time.  8/2: Patient did extremely well until about 2 weeks ago when pain recurred. Pain up to an 8/10 and worse when sitting for prolonged period then goes to get up. Pain anterior and medial with associated swelling. No mechanical symptoms. Is icing Not taking any aleve, mobic, or other medicines  09/26/11: Patient reports last injection only helped for about a week. Pain is about the same - anteromedial with swelling. No  catching, locking. Feels unstable at times. Taking ibuprofen - has tried aleve, mobic in past. Had two cortisone injections (first lasted for about 2 months). Also did PT.  03/20/13: Patient had arthroscopy of left knee with good relief of her pain. Then states has been limping more over past several weeks to months. Recalls about a week ago was worsened more when she slipped and fell in [place] but she's unable to tell me exactly how she fell down. Taking naproxen which helps some. Pain throughout left knee but mostly posterior. Not doing anything for pain including exercises, icing, medication. Works in transportation now.  08/05/13: Patient returns now with bilateral knee pain. Reports she's not currently taking any medicine for pain. Had been taking aleve at nighttime. Walks a lot at work. Describes aching, stiffness. Difficulty sleeping due to pain. Will wake her up in early morning. Does not want repeat injections.  06/18/15: Patient reports she was doing well until about 3 days ago when knees started hurting without a known injury or increase in activity level. Was really bad yesterday but seems to have improved today. No catching, locking, giving out.  01/21/16: Patient returns with 9/10 level pain both knees. Associated stiffness. On prednisone for sarcoidosis. Has taken ibuprofen as well. No skin changes, fever.  10/13: Patient returns with bilateral knee pain but worse on left side. Pain level 8/10 and sharp anteriorly here.  Feels tight above the kneecap. Pain worse with walking and bending knee. Popping that is painful. No skin changes, numbness.  11/13: Patient reports her knee is much better. Primary complaint is left elbow pain posteriorly. No acute injury or trauma. This pops and feels rubbing in posterior aspect. Pain is up to 8/10 and sharp. Worse with lifting items, transferring patients. Tried topical medicine, aleve, massaging area. No skin  changes, numbness.  12/13: Patient reports she is doing better. Left elbow improved with icing, sleeve, exercises, meloxicam. Her knees are bothering her more - more on right than the left past couple months. She is not interested in trying injection on this side at this time. No skin changes, numbness. Pain level 5/10, sharp. Worse with walking.  Past Medical History:  Diagnosis Date  . Chronic pain of both knees   . Gallstones   . Hypertension   . Obesity   . Sarcoidosis ([place])   . Thyroid disease     Current Outpatient Prescriptions on File Prior to Visit  Medication Sig Dispense Refill  . dicyclomine (BENTYL) 20 MG tablet Take 1 tablet (20 mg total) by mouth 2 (two) times daily. 20 tablet 0  . furosemide (LASIX) 20 MG tablet Take 1 tablet (20 mg total) by mouth daily. 30 tablet 0  . levothyroxine (SYNTHROID, LEVOTHROID) 125 MCG tablet Take 1 tablet (125 mcg total) by mouth daily. 30 tablet 0  . ondansetron (ZOFRAN ODT) 4 MG disintegrating tablet Take 1 tablet (4 mg total) by mouth every 8 (eight) hours as needed for nausea or vomiting. 20 tablet 0  . pantoprazole (PROTONIX) 20 MG tablet Take 1 tablet (20 mg total) by mouth daily. 14 tablet 0  . potassium chloride SA (K-DUR,KLOR-CON) 20 MEQ tablet Take 40 mEq by mouth 2 (two) times daily.    Marland Kitchen spironolactone (ALDACTONE) 50 MG tablet Take 50 mg by mouth daily.     No current facility-administered medications on file prior to visit.     Past Surgical History:  Procedure Laterality Date  . ABDOMINAL HYSTERECTOMY    . CHOLECYSTECTOMY  03/12/15   [facility] in [place]  . right knee surgery      Allergies  Allergen Reactions  . Morphine And Related Itching  . Percocet [Oxycodone-Acetaminophen]     itching    Social History   Social History  . Marital status: Single    Spouse name: N/A  . Number of children: N/A  . Years of education: N/A   Occupational History  . Not on file.   Social History  Main Topics  . Smoking status: Never Smoker  . Smokeless tobacco: Never Used  . Alcohol use No  . Drug use: No  . Sexual activity: Not on file   Other Topics Concern  . Not on file   Social History Narrative   She works as a Quarry manager at [employer]   3 children- all live locally, she has 3 grand children.   70- son   44- son   79- son   Completed 12th grade   Enjoys church, spending time with grand children.       Family History  Problem Relation Age of Onset  . Hypertension Mother   . Diabetes Father   . Hypertension Father   . Cancer Maternal Grandmother     breast  . Cancer Maternal Grandfather     prostate  . Heart attack Neg Hx     BP  136/83   Pulse 84   Ht 5\' 5"  (1.651 m)   Wt 245 lb (111.1 kg)   BMI 40.77 kg/m   Review of Systems See HPI above.    Objective:   Physical Exam Gen: NAD  Right elbow: No gross deformity, swelling, bruising. Minimal TTP distal triceps, point of olecranon.  No other tenderness about elbow. FROM. Collateral ligaments intact. NVI distally. Negative tinels radial and cubital tunnels.  Left elbow: FROM without pain.  Right knee: No gross deformity, ecchymoses, effusion. Mild TTP medial and lateral joint lines. FROM. Negative ant/post drawers. Negative valgus/varus testing. Negative lachmanns. Negative mcmurrays, apleys, patellar apprehension. NV intact distally.    Assessment & Plan:  1. Right elbow pain - 2/2 insertional triceps tendinitis and impingement at the olecranon.  Clinically improved.  Switch to diclofenac; sleeve if needed.  Continue home exercises.  Consider nitro patches, intraarticular injection if not improving.  F/u in 5-6 weeks.  2. Right knee pain - will switch to diclofenac.  She will consider injection if not improving.  F/u in 5-6 weeks.

## 2016-12-08 NOTE — Assessment & Plan Note (Signed)
will switch to diclofenac.  She will consider injection if not improving.  F/u in 5-6 weeks.

## 2016-12-08 NOTE — Assessment & Plan Note (Signed)
2/2 insertional triceps tendinitis and impingement at the olecranon.  Clinically improved.  Switch to diclofenac; sleeve if needed.  Continue home exercises.  Consider nitro patches, intraarticular injection if not improving.  F/u in 5-6 weeks.

## 2016-12-12 ENCOUNTER — Ambulatory Visit: Payer: BLUE CROSS/BLUE SHIELD | Admitting: Occupational Therapy

## 2016-12-13 ENCOUNTER — Encounter (HOSPITAL_BASED_OUTPATIENT_CLINIC_OR_DEPARTMENT_OTHER): Payer: Self-pay | Admitting: Emergency Medicine

## 2016-12-13 ENCOUNTER — Emergency Department (HOSPITAL_BASED_OUTPATIENT_CLINIC_OR_DEPARTMENT_OTHER)
Admission: EM | Admit: 2016-12-13 | Discharge: 2016-12-14 | Disposition: A | Discharge status: Home / Self Care | Payer: BLUE CROSS/BLUE SHIELD | Attending: Emergency Medicine | Admitting: Emergency Medicine

## 2016-12-13 DIAGNOSIS — J029 Acute pharyngitis, unspecified: Secondary | ICD-10-CM | POA: Diagnosis not present

## 2016-12-13 DIAGNOSIS — E039 Hypothyroidism, unspecified: Secondary | ICD-10-CM | POA: Diagnosis not present

## 2016-12-13 DIAGNOSIS — Z79899 Other long term (current) drug therapy: Secondary | ICD-10-CM | POA: Insufficient documentation

## 2016-12-13 DIAGNOSIS — I10 Essential (primary) hypertension: Secondary | ICD-10-CM | POA: Diagnosis not present

## 2016-12-13 DIAGNOSIS — K529 Noninfective gastroenteritis and colitis, unspecified: Secondary | ICD-10-CM | POA: Diagnosis not present

## 2016-12-13 DIAGNOSIS — R599 Enlarged lymph nodes, unspecified: Secondary | ICD-10-CM | POA: Diagnosis present

## 2016-12-13 NOTE — ED Provider Notes (Addendum)
[place] DEPT [facility] Provider Note   CSN: [id number] Arrival date & time: 12/13/16  2335  By signing my name below, I, Elizabeth Riggs, attest that this documentation has been prepared under the direction and in the presence of [name], MD. Electronically Signed: [name], ED Scribe. 12/13/16. 12:05 AM.   History   Chief Complaint Chief Complaint  Patient presents with  . Lymphadenopathy   HPI Comments: Elizabeth Riggs is a 45 y.o. female with a hx of hypertension and sarcoidosis who presents to the Emergency Department complaining of gradual onset, multiple episodes of non-bloody diarrhea onset yesterday. She reports associated loss of appetite. She explains that she was evaluated at [facility] last night for nausea and vomiting where she received Reglan. She adds that all she had to eat prior to onset of her symptoms was a piece of cake while at work. Pt has not tried any medications for her diarrhea. She has an allergy to morphine and percocet. She denies any fever, chills, cough, nasal congestion, rhinorrhea, or any other symptoms.  She also c/o gradual, onset painful swollen cervical nodes. She states that she believes this swelling is due to her hx of sarcoidosis.  She reports that she is not currently on Prednisone; last dose was 3 months ago. No other complaints at this time.    The history is provided by the patient. No language interpreter was used.    Past Medical History:  Diagnosis Date  . Chronic pain of both knees   . Gallstones   . Hypertension   . Obesity   . Sarcoidosis ([place])   . Thyroid disease     Patient Active Problem List   Diagnosis Date Noted  . Left elbow pain 11/09/2016  . Class 3 obesity due to excess calories without serious comorbidity with body mass index (BMI) of 40.0 to 44.9 in adult ([place]) 11/02/2016  . GERD (gastroesophageal reflux disease) 07/10/2015  . Geographic tongue 04/28/2015  . Oral thrush 04/28/2015  .  Sarcoidosis ([place]) 04/09/2015  . Hyperglycemia 04/08/2015  . Hypokalemia 03/31/2015  . Constipation 03/19/2015  . Abdominal pain, epigastric 02/18/2015  . Hypothyroidism 02/18/2015  . HTN (hypertension) 02/18/2015  . Bilateral knee pain 05/12/2011    Past Surgical History:  Procedure Laterality Date  . ABDOMINAL HYSTERECTOMY    . CHOLECYSTECTOMY  03/12/15   [facility] in [place]  . right knee surgery      OB History    No data available       Home Medications    Prior to Admission medications   Medication Sig Start Date End Date Taking? Authorizing Provider  diclofenac (VOLTAREN) 75 MG EC tablet Take 1 tablet (75 mg total) by mouth 2 (two) times daily. 12/07/16   Dene Gentry, MD  dicyclomine (BENTYL) 20 MG tablet Take 1 tablet (20 mg total) by mouth 2 (two) times daily. 09/27/16   [name], PA-C  furosemide (LASIX) 20 MG tablet Take 1 tablet (20 mg total) by mouth daily. 09/13/16   Rigoberto Noel, MD  levothyroxine (SYNTHROID, LEVOTHROID) 125 MCG tablet Take 1 tablet (125 mcg total) by mouth daily. 09/13/16   Rigoberto Noel, MD  ondansetron (ZOFRAN ODT) 4 MG disintegrating tablet Take 1 tablet (4 mg total) by mouth every 8 (eight) hours as needed for nausea or vomiting. 09/27/16   [name], PA-C  pantoprazole (PROTONIX) 20 MG tablet Take 1 tablet (20 mg total) by mouth daily. 09/27/16   [name], PA-C  potassium chloride SA (K-DUR,KLOR-CON) 20 MEQ tablet Take 40 mEq by mouth 2 (two) times daily.    Historical Provider, MD  spironolactone (ALDACTONE) 50 MG tablet Take 50 mg by mouth daily.    Historical Provider, MD    Family History Family History  Problem Relation Age of Onset  . Hypertension Mother   . Diabetes Father   . Hypertension Father   . Cancer Maternal Grandmother     breast  . Cancer Maternal Grandfather     prostate  . Heart attack Neg Hx     Social History Social History  Substance Use Topics  . Smoking status: Never Smoker  .  Smokeless tobacco: Never Used  . Alcohol use No     Allergies   Morphine and related and Percocet [oxycodone-acetaminophen]   Review of Systems Review of Systems  A complete 10 system review of systems was obtained and all systems are negative except as noted in the HPI and PMH.    Physical Exam Updated Vital Signs BP 140/91 (BP Location: Right Arm)   Pulse 82   Temp 98.3 F (36.8 C) (Oral)   Resp 18   Ht 5\' 5"  (1.651 m)   Wt 245 lb (111.1 kg)   SpO2 96%   BMI 40.77 kg/m   Physical Exam  Constitutional: She is oriented to person, place, and time. She appears well-developed and well-nourished. No distress.  HENT:  Head: Normocephalic and atraumatic.  Nose: Nose normal.  Mouth/Throat: Oropharynx is clear and moist. No oropharyngeal exudate.  Eyes: Conjunctivae and EOM are normal. Pupils are equal, round, and reactive to light. No scleral icterus.  Neck: Normal range of motion. Neck supple. No JVD present. No tracheal deviation present. No thyromegaly present.  Bilateral cervical lymphadenopathy   Cardiovascular: Normal rate, regular rhythm and normal heart sounds.  Exam reveals no gallop and no friction rub.   No murmur heard. Pulmonary/Chest: Effort normal and breath sounds normal. No respiratory distress. She has no wheezes. She exhibits no tenderness.  Abdominal: Soft. Bowel sounds are normal. She exhibits no distension and no mass. There is no tenderness. There is no rebound and no guarding.  Musculoskeletal: Normal range of motion. She exhibits no edema or tenderness.  Lymphadenopathy:    She has cervical adenopathy.  Neurological: She is alert and oriented to person, place, and time. No cranial nerve deficit. She exhibits normal muscle tone.  Skin: Skin is warm and dry. No rash noted. No erythema. No pallor.  Nursing note and vitals reviewed.    ED Treatments / Results  DIAGNOSTIC STUDIES: Oxygen Saturation is 96% on RA, adequate by my interpretation.     COORDINATION OF CARE: 12:05 AM- Pt advised of plan for treatment and pt agrees. Pt will receive Imodium and Bentyl.    Labs (all labs ordered are listed, but only abnormal results are displayed) Labs Reviewed - No data to display  EKG  EKG Interpretation None       Radiology No results found.  Procedures Procedures (including critical care time)  Medications Ordered in ED Medications - No data to display   Initial Impression / Assessment and Plan / ED Course  [name], MD has reviewed the triage vital signs and the nursing notes.  Pertinent labs & imaging results that were available during my care of the patient were reviewed by me and considered in my medical decision making (see chart for details).  Clinical Course     Patient presents to the emergency  department for nausea vomiting and diarrhea for the past couple of days. Likely gastroenteritis. She is evaluated [facility] 2 days ago and they obtain labs and a CT scan which did not show any acute abnormality. Vomiting has resolved with Reglan but she still complains of diarrhea. Was given one dose of loperamide and Bentyl. She is also complaining of cervical lymphadenopathy without any other viral URI symptoms. This may be related to her sarcoid.  Will prescribe 5 days of prednisone.  She was given Toradol and Decadron for her symptoms, advised to follow the primary care physician. She is concerned that is because she is not taking her prednisone for her sarcoidosis. She will call to make an appointment within 3 days. She appears well and in no acute distress, vital signs were within normal limits and she is safe for discharge.   Final Clinical Impressions(s) / ED Diagnoses   Final diagnoses:  None    New Prescriptions New Prescriptions   No medications on file     I personally performed the services described in this documentation, which was scribed in my presence. The recorded information has been  reviewed and is accurate.      [name], MD 12/14/16 [id number]    [name], MD 12/14/16 [id number]

## 2016-12-13 NOTE — ED Triage Notes (Signed)
Patient reports that she was at [facility] [place] last night for N/V - reports that she now has swelling under her chin and to her neck. The patient reports that she also is having frequent loose stools

## 2016-12-14 ENCOUNTER — Ambulatory Visit: Payer: BLUE CROSS/BLUE SHIELD | Admitting: Occupational Therapy

## 2016-12-14 MED ORDER — DEXAMETHASONE SODIUM PHOSPHATE 10 MG/ML IJ SOLN
10.0000 mg | Freq: Once | INTRAMUSCULAR | Status: AC
  Administered 2016-12-14: 10 mg via INTRAMUSCULAR
  Filled 2016-12-14: qty 1

## 2016-12-14 MED ORDER — KETOROLAC TROMETHAMINE 60 MG/2ML IM SOLN
60.0000 mg | Freq: Once | INTRAMUSCULAR | Status: AC
  Administered 2016-12-14: 60 mg via INTRAMUSCULAR
  Filled 2016-12-14: qty 2

## 2016-12-14 MED ORDER — PREDNISONE 20 MG PO TABS: 60.0000 mg | ORAL_TABLET | Freq: Every day | ORAL | 0 refills | Status: DC

## 2016-12-14 MED ORDER — LOPERAMIDE HCL 2 MG PO CAPS
4.0000 mg | ORAL_CAPSULE | Freq: Once | ORAL | Status: AC
  Administered 2016-12-14: 4 mg via ORAL
  Filled 2016-12-14: qty 2

## 2016-12-14 MED ORDER — DICYCLOMINE HCL 10 MG PO CAPS
20.0000 mg | ORAL_CAPSULE | Freq: Once | ORAL | Status: AC
  Administered 2016-12-14: 20 mg via ORAL
  Filled 2016-12-14: qty 2

## 2016-12-25 ENCOUNTER — Emergency Department (HOSPITAL_BASED_OUTPATIENT_CLINIC_OR_DEPARTMENT_OTHER)
Admission: EM | Admit: 2016-12-25 | Discharge: 2016-12-25 | Disposition: A | Discharge status: Home / Self Care | Payer: BLUE CROSS/BLUE SHIELD | Attending: Emergency Medicine | Admitting: Emergency Medicine

## 2016-12-25 ENCOUNTER — Encounter (HOSPITAL_BASED_OUTPATIENT_CLINIC_OR_DEPARTMENT_OTHER): Payer: Self-pay | Admitting: Emergency Medicine

## 2016-12-25 DIAGNOSIS — Z79899 Other long term (current) drug therapy: Secondary | ICD-10-CM | POA: Insufficient documentation

## 2016-12-25 DIAGNOSIS — B370 Candidal stomatitis: Secondary | ICD-10-CM

## 2016-12-25 DIAGNOSIS — B379 Candidiasis, unspecified: Secondary | ICD-10-CM | POA: Diagnosis present

## 2016-12-25 DIAGNOSIS — E039 Hypothyroidism, unspecified: Secondary | ICD-10-CM | POA: Diagnosis not present

## 2016-12-25 DIAGNOSIS — I10 Essential (primary) hypertension: Secondary | ICD-10-CM | POA: Insufficient documentation

## 2016-12-25 MED ORDER — NYSTATIN 100000 UNIT/ML MT SUSP: OROMUCOSAL | 0 refills | Status: DC

## 2016-12-25 MED ORDER — FLUCONAZOLE 50 MG PO TABS
150.0000 mg | ORAL_TABLET | Freq: Once | ORAL | Status: AC
  Administered 2016-12-25: 150 mg via ORAL
  Filled 2016-12-25: qty 1

## 2016-12-25 MED ORDER — FLUCONAZOLE 150 MG PO TABS: 150.0000 mg | ORAL_TABLET | Freq: Every day | ORAL | 0 refills | Status: AC

## 2016-12-25 NOTE — ED Triage Notes (Signed)
Patient states that she is on antibiotics for a tooth ache and has thrush in her mouth

## 2016-12-25 NOTE — ED Provider Notes (Signed)
[place] DEPT [facility] Provider Note   CSN: [id number] Arrival date & time: 12/25/16  1320     History   Chief Complaint Chief Complaint  Patient presents with  . Thrush    HPI Elizabeth Riggs is a 45 y.o. female. She has been on amoxicillin for a dental infection no sitter tongue was sore and had white on it. She thinks she may have thrush. She has a history of sarcoid was previous on prednisone and methotrexate and had thrush at that time.  HPI  Past Medical History:  Diagnosis Date  . Chronic pain of both knees   . Gallstones   . Hypertension   . Obesity   . Sarcoidosis ([place])   . Thyroid disease     Patient Active Problem List   Diagnosis Date Noted  . Left elbow pain 11/09/2016  . Class 3 obesity due to excess calories without serious comorbidity with body mass index (BMI) of 40.0 to 44.9 in adult ([place]) 11/02/2016  . GERD (gastroesophageal reflux disease) 07/10/2015  . Geographic tongue 04/28/2015  . Oral thrush 04/28/2015  . Sarcoidosis ([place]) 04/09/2015  . Hyperglycemia 04/08/2015  . Hypokalemia 03/31/2015  . Constipation 03/19/2015  . Abdominal pain, epigastric 02/18/2015  . Hypothyroidism 02/18/2015  . HTN (hypertension) 02/18/2015  . Bilateral knee pain 05/12/2011    Past Surgical History:  Procedure Laterality Date  . ABDOMINAL HYSTERECTOMY    . CHOLECYSTECTOMY  03/12/15   [facility] in [place]  . right knee surgery      OB History    No data available       Home Medications    Prior to Admission medications   Medication Sig Start Date End Date Taking? Authorizing Provider  diclofenac (VOLTAREN) 75 MG EC tablet Take 1 tablet (75 mg total) by mouth 2 (two) times daily. 12/07/16   Dene Gentry, MD  dicyclomine (BENTYL) 20 MG tablet Take 1 tablet (20 mg total) by mouth 2 (two) times daily. 09/27/16   [name], PA-C  fluconazole (DIFLUCAN) 150 MG tablet Take 1 tablet (150 mg total) by mouth daily. 12/25/16 12/26/16  Tanna Furry, MD  furosemide (LASIX) 20 MG tablet Take 1 tablet (20 mg total) by mouth daily. 09/13/16   Rigoberto Noel, MD  levothyroxine (SYNTHROID, LEVOTHROID) 125 MCG tablet Take 1 tablet (125 mcg total) by mouth daily. 09/13/16   Rigoberto Noel, MD  nystatin (MYCOSTATIN) 100000 UNIT/ML suspension Swish, gargle, and spit 5 mL 3-4 times per day. 12/25/16   Tanna Furry, MD  ondansetron (ZOFRAN ODT) 4 MG disintegrating tablet Take 1 tablet (4 mg total) by mouth every 8 (eight) hours as needed for nausea or vomiting. 09/27/16   [name], PA-C  pantoprazole (PROTONIX) 20 MG tablet Take 1 tablet (20 mg total) by mouth daily. 09/27/16   [name], PA-C  potassium chloride SA (K-DUR,KLOR-CON) 20 MEQ tablet Take 40 mEq by mouth 2 (two) times daily.    Historical Provider, MD  predniSONE (DELTASONE) 20 MG tablet Take 3 tablets (60 mg total) by mouth daily. 12/14/16   [name], MD  spironolactone (ALDACTONE) 50 MG tablet Take 50 mg by mouth daily.    Historical Provider, MD    Family History Family History  Problem Relation Age of Onset  . Hypertension Mother   . Diabetes Father   . Hypertension Father   . Cancer Maternal Grandmother     breast  . Cancer Maternal Grandfather     prostate  .  Heart attack Neg Hx     Social History Social History  Substance Use Topics  . Smoking status: Never Smoker  . Smokeless tobacco: Never Used  . Alcohol use No     Allergies   Morphine and related and Percocet [oxycodone-acetaminophen]   Review of Systems Review of Systems  Constitutional: Negative for appetite change, chills, diaphoresis, fatigue and fever.  HENT: Negative for mouth sores, sore throat and trouble swallowing.        Thrush. Tongue pain.  Eyes: Negative for visual disturbance.  Respiratory: Negative for cough, chest tightness, shortness of breath and wheezing.   Cardiovascular: Negative for chest pain.  Gastrointestinal: Negative for abdominal distention, abdominal pain, diarrhea,  nausea and vomiting.  Endocrine: Negative for polydipsia, polyphagia and polyuria.  Genitourinary: Negative for dysuria, frequency and hematuria.  Musculoskeletal: Negative for gait problem.  Skin: Negative for color change, pallor and rash.  Neurological: Negative for dizziness, syncope, light-headedness and headaches.  Hematological: Does not bruise/bleed easily.  Psychiatric/Behavioral: Negative for behavioral problems and confusion.     Physical Exam Updated Vital Signs BP 143/99 (BP Location: Right Arm)   Pulse 87   Temp 97.8 F (36.6 C) (Oral)   Resp 20   Ht 5\' 5"  (1.651 m)   Wt 245 lb (111.1 kg)   SpO2 99%   BMI 40.77 kg/m   Physical Exam  Constitutional: She is oriented to person, place, and time. She appears well-developed and well-nourished. No distress.  HENT:  Head: Normocephalic.  Erythematous tongue with white coating consistent with oropharyngeal thrush  Eyes: Conjunctivae are normal. Pupils are equal, round, and reactive to light. No scleral icterus.  Neck: Normal range of motion. Neck supple. No thyromegaly present.  Cardiovascular: Normal rate and regular rhythm.  Exam reveals no gallop and no friction rub.   No murmur heard. Pulmonary/Chest: Effort normal and breath sounds normal. No respiratory distress. She has no wheezes. She has no rales.  Abdominal: Soft. Bowel sounds are normal. She exhibits no distension. There is no tenderness. There is no rebound.  Musculoskeletal: Normal range of motion.  Neurological: She is alert and oriented to person, place, and time.  Skin: Skin is warm and dry. No rash noted.  Psychiatric: She has a normal mood and affect. Her behavior is normal.     ED Treatments / Results  Labs (all labs ordered are listed, but only abnormal results are displayed) Labs Reviewed - No data to display  EKG  EKG Interpretation None       Radiology No results found.  Procedures Procedures (including critical care  time)  Medications Ordered in ED Medications  fluconazole (DIFLUCAN) tablet 150 mg (not administered)     Initial Impression / Assessment and Plan / ED Course  I have reviewed the triage vital signs and the nursing notes.  Pertinent labs & imaging results that were available during my care of the patient were reviewed by me and considered in my medical decision making (see chart for details).  Clinical Course     Given nystatin. Prescription for 1 additional dose in 7 days. Nystatin swish gargle and spit for the next 7 days.  Final Clinical Impressions(s) / ED Diagnoses   Final diagnoses:  Thrush    New Prescriptions New Prescriptions   FLUCONAZOLE (DIFLUCAN) 150 MG TABLET    Take 1 tablet (150 mg total) by mouth daily.   NYSTATIN (MYCOSTATIN) 100000 UNIT/ML SUSPENSION    Swish, gargle, and spit 5 mL 3-4 times per  day.     Tanna Furry, MD 12/25/16 [id number]

## 2016-12-28 ENCOUNTER — Ambulatory Visit: Payer: BLUE CROSS/BLUE SHIELD | Admitting: Occupational Therapy

## 2017-01-11 ENCOUNTER — Ambulatory Visit: Payer: BLUE CROSS/BLUE SHIELD | Admitting: Family Medicine

## 2017-01-12 DIAGNOSIS — H3550 Unspecified hereditary retinal dystrophy: Secondary | ICD-10-CM | POA: Insufficient documentation

## 2017-01-18 ENCOUNTER — Encounter: Payer: Self-pay | Admitting: Occupational Therapy

## 2017-01-18 NOTE — Therapy (Signed)
[facility] [address]  [place], [place], [address] Phone: [phone]   Fax:  [phone]  Patient Details  Name: Elizabeth Riggs MRN: [id number] Date of Birth: [birth year] Referring Provider:  Dr. Karlton Lemon Encounter Date: 01/18/2017   OCCUPATIONAL THERAPY DISCHARGE SUMMARY  Visits from Start of Care: 4  Current functional level related to goals / functional outcomes: Pt did not meet any LTG's from evaluation secondary to not returning after the 4th visit on 12/05/16.    Remaining deficits: Same as initial evaluation   Education / Equipment: HEP's, pain reduction strategies  Plan: Patient agrees to discharge.  Patient goals were not met. Patient is being discharged due to not returning since the last visit.  ?????       Carey Bullocks, OTR/L 01/18/2017, 11:37 AM  [facility] [address]  [place], [place], [address] Phone: [phone]   Fax:  [phone]

## 2017-02-28 ENCOUNTER — Ambulatory Visit: Payer: BLUE CROSS/BLUE SHIELD | Admitting: Family Medicine

## 2017-03-01 ENCOUNTER — Encounter: Payer: Self-pay | Admitting: Family Medicine

## 2017-03-01 ENCOUNTER — Ambulatory Visit (INDEPENDENT_AMBULATORY_CARE_PROVIDER_SITE_OTHER): Payer: BLUE CROSS/BLUE SHIELD | Admitting: Family Medicine

## 2017-03-01 DIAGNOSIS — M25511 Pain in right shoulder: Secondary | ICD-10-CM | POA: Diagnosis not present

## 2017-03-01 MED ORDER — NITROGLYCERIN 0.2 MG/HR TD PT24: MEDICATED_PATCH | TRANSDERMAL | 1 refills | Status: DC

## 2017-03-01 NOTE — Patient Instructions (Signed)
You have rotator cuff impingement Try to avoid painful activities (overhead activities, lifting with extended arm) as much as possible. Aleve 2 tabs twice a day with food OR ibuprofen 3 tabs three times a day with food for pain and inflammation. Can take tylenol in addition to this. Subacromial injection may be beneficial to help with pain and to decrease inflammation. Consider physical therapy with transition to home exercise program. Do home exercise program with theraband and scapular stabilization exercises daily - these are very important for long term relief even if an injection was given - 3 sets of 10 once a day. Nitro patches 1/4th patch to affected shoulder, change daily. If not improving at follow-up we will consider further imaging, injection, physical therapy. Follow up with me in 6 weeks for reevaluation.

## 2017-03-03 DIAGNOSIS — M25511 Pain in right shoulder: Secondary | ICD-10-CM | POA: Insufficient documentation

## 2017-03-03 NOTE — Progress Notes (Signed)
PCP: PROVIDER NOT IN SYSTEM  Subjective:   HPI: Patient is a 46 y.o. female here for right shoulder pain.  Patient reports for about 2 months she's had worsening lateral right shoulder pain. No injury or trauma. Pain is a tightness, worse with overhead reaching at 7/10 level. Dong home exercises, using heating pad. No help with medications. No skin changes, numbness.  Past Medical History:  Diagnosis Date  . Chronic pain of both knees   . Gallstones   . Hypertension   . Obesity   . Sarcoidosis ([place])   . Thyroid disease     Current Outpatient Prescriptions on File Prior to Visit  Medication Sig Dispense Refill  . diclofenac (VOLTAREN) 75 MG EC tablet Take 1 tablet (75 mg total) by mouth 2 (two) times daily. 60 tablet 1  . dicyclomine (BENTYL) 20 MG tablet Take 1 tablet (20 mg total) by mouth 2 (two) times daily. 20 tablet 0  . furosemide (LASIX) 20 MG tablet Take 1 tablet (20 mg total) by mouth daily. 30 tablet 0  . levothyroxine (SYNTHROID, LEVOTHROID) 125 MCG tablet Take 1 tablet (125 mcg total) by mouth daily. 30 tablet 0  . nystatin (MYCOSTATIN) 100000 UNIT/ML suspension Swish, gargle, and spit 5 mL 3-4 times per day. 120 mL 0  . ondansetron (ZOFRAN ODT) 4 MG disintegrating tablet Take 1 tablet (4 mg total) by mouth every 8 (eight) hours as needed for nausea or vomiting. 20 tablet 0  . pantoprazole (PROTONIX) 20 MG tablet Take 1 tablet (20 mg total) by mouth daily. 14 tablet 0  . potassium chloride SA (K-DUR,KLOR-CON) 20 MEQ tablet Take 40 mEq by mouth 2 (two) times daily.    . predniSONE (DELTASONE) 20 MG tablet Take 3 tablets (60 mg total) by mouth daily. 15 tablet 0  . spironolactone (ALDACTONE) 50 MG tablet Take 50 mg by mouth daily.     No current facility-administered medications on file prior to visit.     Past Surgical History:  Procedure Laterality Date  . ABDOMINAL HYSTERECTOMY    . CHOLECYSTECTOMY  03/12/15   [facility] in [place]  . right  knee surgery      Allergies  Allergen Reactions  . Morphine And Related Itching  . Percocet [Oxycodone-Acetaminophen]     itching    Social History   Social History  . Marital status: Single    Spouse name: N/A  . Number of children: N/A  . Years of education: N/A   Occupational History  . Not on file.   Social History Main Topics  . Smoking status: Never Smoker  . Smokeless tobacco: Never Used  . Alcohol use No  . Drug use: No  . Sexual activity: Not on file   Other Topics Concern  . Not on file   Social History Narrative   She works as a Quarry manager at [employer]   3 children- all live locally, she has 3 grand children.   25- son   21- son   58- son   Completed 12th grade   Enjoys church, spending time with grand children.       Family History  Problem Relation Age of Onset  . Hypertension Mother   . Diabetes Father   . Hypertension Father   . Cancer Maternal Grandmother     breast  . Cancer Maternal Grandfather     prostate  . Heart attack Neg Hx     BP 116/70  Pulse 77   Ht 5\' 5"  (1.651 m)   Wt 245 lb (111.1 kg)   BMI 40.77 kg/m   Review of Systems: See HPI above.     Objective:  Physical Exam:  Gen: NAD, comfortable in exam room  Right shoulder: No swelling, ecchymoses.  No gross deformity. No TTP. FROM with painful arc. Positive Hawkins, Neers. Negative Yergasons. Strength 5/5 with empty can and resisted internal/external rotation.  Pain empty can. Negative apprehension. NV intact distally.  Left shoulder: FROM without pain.   Assessment & Plan:  1. Right shoulder pain - 2/2 rotator cuff impingement.  Reviewed home exercises to do daily.  Aleve or ibuprofen.  Subacromial injection discussed - she would like to wait on this and PT.  Nitro patches - discussed risks of headaches, skin irritation.  F/u in 6 weeks.

## 2017-03-03 NOTE — Assessment & Plan Note (Signed)
2/2 rotator cuff impingement.  Reviewed home exercises to do daily.  Aleve or ibuprofen.  Subacromial injection discussed - she would like to wait on this and PT.  Nitro patches - discussed risks of headaches, skin irritation.  F/u in 6 weeks.

## 2017-03-09 ENCOUNTER — Emergency Department (HOSPITAL_BASED_OUTPATIENT_CLINIC_OR_DEPARTMENT_OTHER): Payer: BLUE CROSS/BLUE SHIELD

## 2017-03-09 ENCOUNTER — Encounter (HOSPITAL_BASED_OUTPATIENT_CLINIC_OR_DEPARTMENT_OTHER): Payer: Self-pay

## 2017-03-09 ENCOUNTER — Emergency Department (HOSPITAL_BASED_OUTPATIENT_CLINIC_OR_DEPARTMENT_OTHER)
Admission: EM | Admit: 2017-03-09 | Discharge: 2017-03-09 | Disposition: A | Discharge status: Home / Self Care | Payer: BLUE CROSS/BLUE SHIELD | Attending: Emergency Medicine | Admitting: Emergency Medicine

## 2017-03-09 DIAGNOSIS — I10 Essential (primary) hypertension: Secondary | ICD-10-CM | POA: Insufficient documentation

## 2017-03-09 DIAGNOSIS — Z79899 Other long term (current) drug therapy: Secondary | ICD-10-CM | POA: Insufficient documentation

## 2017-03-09 DIAGNOSIS — R1010 Upper abdominal pain, unspecified: Secondary | ICD-10-CM | POA: Insufficient documentation

## 2017-03-09 DIAGNOSIS — E039 Hypothyroidism, unspecified: Secondary | ICD-10-CM | POA: Diagnosis not present

## 2017-03-09 DIAGNOSIS — R1013 Epigastric pain: Secondary | ICD-10-CM | POA: Insufficient documentation

## 2017-03-09 LAB — COMPREHENSIVE METABOLIC PANEL
ALBUMIN: 3.4 g/dL — AB (ref 3.5–5.0)
ALT: 12 U/L — ABNORMAL LOW (ref 14–54)
ANION GAP: 6 (ref 5–15)
AST: 17 U/L (ref 15–41)
Alkaline Phosphatase: 113 U/L (ref 38–126)
BUN: 9 mg/dL (ref 6–20)
CO2: 27 mmol/L (ref 22–32)
Calcium: 8.8 mg/dL — ABNORMAL LOW (ref 8.9–10.3)
Chloride: 104 mmol/L (ref 101–111)
Creatinine, Ser: 0.78 mg/dL (ref 0.44–1.00)
GFR calc Af Amer: >60 mL/min (ref >60)
GFR calc non Af Amer: >60 mL/min (ref >60)
GLUCOSE: 98 mg/dL (ref 65–99)
POTASSIUM: 3.4 mmol/L — AB (ref 3.5–5.1)
SODIUM: 137 mmol/L (ref 135–145)
Total Bilirubin: 0.5 mg/dL (ref 0.3–1.2)
Total Protein: 7.6 g/dL (ref 6.5–8.1)

## 2017-03-09 LAB — CBC WITH DIFFERENTIAL/PLATELET
BASOS ABS: 0 10*3/uL (ref 0.0–0.1)
Basophils Relative: 0 %
Eosinophils Absolute: 0.2 10*3/uL (ref 0.0–0.7)
Eosinophils Relative: 3 %
HEMATOCRIT: 36.7 % (ref 36.0–46.0)
HEMOGLOBIN: 12.4 g/dL (ref 12.0–15.0)
LYMPHS PCT: 19 %
Lymphs Abs: 1.5 10*3/uL (ref 0.7–4.0)
MCH: 28.2 pg (ref 26.0–34.0)
MCHC: 33.8 g/dL (ref 30.0–36.0)
MCV: 83.6 fL (ref 78.0–100.0)
MONO ABS: 1.2 10*3/uL — AB (ref 0.1–1.0)
MONOS PCT: 15 %
NEUTROS ABS: 5.1 10*3/uL (ref 1.7–7.7)
Neutrophils Relative %: 63 %
Platelets: 265 10*3/uL (ref 150–400)
RBC: 4.39 MIL/uL (ref 3.87–5.11)
RDW: 15.6 % — AB (ref 11.5–15.5)
WBC: 8 10*3/uL (ref 4.0–10.5)

## 2017-03-09 LAB — URINALYSIS, MICROSCOPIC (REFLEX): WBC UA: NONE SEEN WBC/hpf (ref 0–5)

## 2017-03-09 LAB — URINALYSIS, ROUTINE W REFLEX MICROSCOPIC
BILIRUBIN URINE: NEGATIVE
Glucose, UA: NEGATIVE mg/dL
Hgb urine dipstick: NEGATIVE
Ketones, ur: NEGATIVE mg/dL
Leukocytes, UA: NEGATIVE
NITRITE: NEGATIVE
PH: 7 (ref 5.0–8.0)
Protein, ur: NEGATIVE mg/dL
SPECIFIC GRAVITY, URINE: 1.015 (ref 1.005–1.030)

## 2017-03-09 LAB — PREGNANCY, URINE: PREG TEST UR: NEGATIVE

## 2017-03-09 LAB — LIPASE, BLOOD: Lipase: 20 U/L (ref 11–51)

## 2017-03-09 MED ORDER — OMEPRAZOLE 20 MG PO CPDR: 20.0000 mg | DELAYED_RELEASE_CAPSULE | Freq: Every day | ORAL | 0 refills | Status: DC

## 2017-03-09 MED ORDER — SODIUM CHLORIDE 0.9 % IV BOLUS (SEPSIS)
1000.0000 mL | Freq: Once | INTRAVENOUS | Status: AC
  Administered 2017-03-09: 1000 mL via INTRAVENOUS

## 2017-03-09 MED ORDER — KETOROLAC TROMETHAMINE 30 MG/ML IJ SOLN
30.0000 mg | Freq: Once | INTRAMUSCULAR | Status: AC
  Administered 2017-03-09: 30 mg via INTRAVENOUS
  Filled 2017-03-09: qty 1

## 2017-03-09 MED ORDER — DICYCLOMINE HCL 20 MG PO TABS: 20.0000 mg | ORAL_TABLET | Freq: Two times a day (BID) | ORAL | 0 refills | Status: DC

## 2017-03-09 MED ORDER — DICYCLOMINE HCL 10 MG/ML IM SOLN
20.0000 mg | Freq: Once | INTRAMUSCULAR | Status: AC
  Administered 2017-03-09: 20 mg via INTRAMUSCULAR
  Filled 2017-03-09: qty 2

## 2017-03-09 MED ORDER — GI COCKTAIL ~~LOC~~
30.0000 mL | Freq: Once | ORAL | Status: AC
  Administered 2017-03-09: 30 mL via ORAL
  Filled 2017-03-09: qty 30

## 2017-03-09 NOTE — ED Provider Notes (Signed)
[facility] Provider Note   CSN: [id number] Arrival date & time: 03/09/17  1249     History   Chief Complaint Chief Complaint  Patient presents with  . Abdominal Pain    HPI Elizabeth Riggs is a 46 y.o. female.  Patient is a 46 year old female who presents with abdominal pain. She states yesterday she started having a crampy pain across her upper abdomen radiating to her upper back. It's been fairly constant. She denies any nausea or vomiting. She denies any relationship to food. She denies any reflux symptoms. No urinary symptoms. She had a normal bowel movement yesterday. She denies any diarrhea. She took 1 dose of Percocet which she had at home for the pain and when dose of diclofenac without improvement in symptoms. She denies any known fevers. She is status post cholecystectomy.      Past Medical History:  Diagnosis Date  . Chronic pain of both knees   . Gallstones   . Hypertension   . Obesity   . Sarcoidosis ([place])   . Thyroid disease     Patient Active Problem List   Diagnosis Date Noted  . Right shoulder pain 03/03/2017  . Left elbow pain 11/09/2016  . Class 3 obesity due to excess calories without serious comorbidity with body mass index (BMI) of 40.0 to 44.9 in adult ([place]) 11/02/2016  . GERD (gastroesophageal reflux disease) 07/10/2015  . Geographic tongue 04/28/2015  . Oral thrush 04/28/2015  . Sarcoidosis ([place]) 04/09/2015  . Hyperglycemia 04/08/2015  . Hypokalemia 03/31/2015  . Constipation 03/19/2015  . Abdominal pain, epigastric 02/18/2015  . Hypothyroidism 02/18/2015  . HTN (hypertension) 02/18/2015  . Bilateral knee pain 05/12/2011    Past Surgical History:  Procedure Laterality Date  . ABDOMINAL HYSTERECTOMY    . CHOLECYSTECTOMY  03/12/15   [facility] in [place]  . KNEE SURGERY    . right knee surgery      OB History    No data available       Home Medications    Prior to Admission medications     Medication Sig Start Date End Date Taking? Authorizing Provider  diclofenac (VOLTAREN) 75 MG EC tablet Take 1 tablet (75 mg total) by mouth 2 (two) times daily. 12/07/16   Dene Gentry, MD  dicyclomine (BENTYL) 20 MG tablet Take 1 tablet (20 mg total) by mouth 2 (two) times daily. 03/09/17   Malvin Johns, MD  furosemide (LASIX) 20 MG tablet Take 1 tablet (20 mg total) by mouth daily. 09/13/16   Rigoberto Noel, MD  levothyroxine (SYNTHROID, LEVOTHROID) 125 MCG tablet Take 1 tablet (125 mcg total) by mouth daily. 09/13/16   Rigoberto Noel, MD  nitroGLYCERIN (NITRODUR - DOSED IN MG/24 HR) 0.2 mg/hr patch Apply 1/4th patch to affected area, change daily 03/01/17   Dene Gentry, MD  nystatin (MYCOSTATIN) 100000 UNIT/ML suspension Swish, gargle, and spit 5 mL 3-4 times per day. 12/25/16   Tanna Furry, MD  omeprazole (PRILOSEC) 20 MG capsule Take 1 capsule (20 mg total) by mouth daily. 03/09/17   Malvin Johns, MD  ondansetron (ZOFRAN ODT) 4 MG disintegrating tablet Take 1 tablet (4 mg total) by mouth every 8 (eight) hours as needed for nausea or vomiting. 09/27/16   [name], PA-C  pantoprazole (PROTONIX) 20 MG tablet Take 1 tablet (20 mg total) by mouth daily. 09/27/16   [name], PA-C  potassium chloride SA (K-DUR,KLOR-CON) 20 MEQ tablet Take 40 mEq by mouth 2 (two)  times daily.    Historical Provider, MD  predniSONE (DELTASONE) 20 MG tablet Take 3 tablets (60 mg total) by mouth daily. 12/14/16   [name], MD  spironolactone (ALDACTONE) 50 MG tablet Take 50 mg by mouth daily.    Historical Provider, MD    Family History Family History  Problem Relation Age of Onset  . Hypertension Mother   . Diabetes Father   . Hypertension Father   . Cancer Maternal Grandmother     breast  . Cancer Maternal Grandfather     prostate  . Heart attack Neg Hx     Social History Social History  Substance Use Topics  . Smoking status: Never Smoker  . Smokeless tobacco: Never Used  . Alcohol use No      Allergies   Morphine and related   Review of Systems Review of Systems  Constitutional: Negative for chills, diaphoresis, fatigue and fever.  HENT: Negative for congestion, rhinorrhea and sneezing.   Eyes: Negative.   Respiratory: Negative for cough, chest tightness and shortness of breath.   Cardiovascular: Negative for chest pain and leg swelling.  Gastrointestinal: Positive for abdominal pain. Negative for blood in stool, diarrhea, nausea and vomiting.  Genitourinary: Negative for difficulty urinating, flank pain, frequency and hematuria.  Musculoskeletal: Positive for back pain. Negative for arthralgias.  Skin: Negative for rash.  Neurological: Negative for dizziness, speech difficulty, weakness, numbness and headaches.     Physical Exam Updated Vital Signs BP (!) 138/98 (BP Location: Left Arm)   Pulse 95   Temp 97.8 F (36.6 C) (Oral)   Resp 18   Ht 5\' 5"  (1.651 m)   Wt 245 lb (111.1 kg)   SpO2 97%   BMI 40.77 kg/m   Physical Exam  Constitutional: She is oriented to person, place, and time. She appears well-developed and well-nourished.  HENT:  Head: Normocephalic and atraumatic.  Eyes: Pupils are equal, round, and reactive to light.  Neck: Normal range of motion. Neck supple.  Cardiovascular: Normal rate, regular rhythm and normal heart sounds.   Pulmonary/Chest: Effort normal and breath sounds normal. No respiratory distress. She has no wheezes. She has no rales. She exhibits no tenderness.  Abdominal: Soft. Bowel sounds are normal. There is tenderness (Tenderness across the upper abdomen and epigastrium). There is no rebound and no guarding.  Musculoskeletal: Normal range of motion. She exhibits no edema.  Lymphadenopathy:    She has no cervical adenopathy.  Neurological: She is alert and oriented to person, place, and time.  Skin: Skin is warm and dry. No rash noted.  Psychiatric: She has a normal mood and affect.     ED Treatments / Results   Labs (all labs ordered are listed, but only abnormal results are displayed) Labs Reviewed  COMPREHENSIVE METABOLIC PANEL - Abnormal; Notable for the following:       Result Value   Potassium 3.4 (*)    Calcium 8.8 (*)    Albumin 3.4 (*)    ALT 12 (*)    All other components within normal limits  CBC WITH DIFFERENTIAL/PLATELET - Abnormal; Notable for the following:    RDW 15.6 (*)    Monocytes Absolute 1.2 (*)    All other components within normal limits  URINALYSIS, ROUTINE W REFLEX MICROSCOPIC - Abnormal; Notable for the following:    APPearance TURBID (*)    All other components within normal limits  URINALYSIS, MICROSCOPIC (REFLEX) - Abnormal; Notable for the following:    Bacteria, UA RARE (*)  Squamous Epithelial / LPF 0-5 (*)    All other components within normal limits  LIPASE, BLOOD  PREGNANCY, URINE    EKG  EKG Interpretation None       Radiology Dg Abd Acute W/chest  Result Date: 03/09/2017 CLINICAL DATA:  Abdominal pain, cramping EXAM: DG ABDOMEN ACUTE W/ 1V CHEST COMPARISON:  CT 12/13/2016 FINDINGS: Mild peribronchial thickening. Heart is upper limits normal in size. No confluent opacities or effusions. There is normal bowel gas pattern. No free air. No organomegaly or suspicious calcification. No acute bony abnormality. IMPRESSION: Bronchitic changes. No evidence of bowel obstruction or free air. Electronically Signed   By: Rolm Baptise M.D.   On: 03/09/2017 13:29    Procedures Procedures (including critical care time)  Medications Ordered in ED Medications  sodium chloride 0.9 % bolus 1,000 mL (1,000 mLs Intravenous New Bag/Given 03/09/17 1346)  gi cocktail (Maalox,Lidocaine,Donnatal) (30 mLs Oral Given 03/09/17 1345)  ketorolac (TORADOL) 30 MG/ML injection 30 mg (30 mg Intravenous Given 03/09/17 1345)  dicyclomine (BENTYL) injection 20 mg (20 mg Intramuscular Given 03/09/17 1409)     Initial Impression / Assessment and Plan / ED Course  I have  reviewed the triage vital signs and the nursing notes.  Pertinent labs & imaging results that were available during my care of the patient were reviewed by me and considered in my medical decision making (see chart for details).     Patient presents with upper abdominal cramping that radiates to her back. There is no evidence of obstruction. She status post cholecystectomy. There is no evidence of pancreatitis. Her other labs are unremarkable. She was given GI cocktail and Bentyl with complete resolution in symptoms. At this point I don't feel that she needs further imaging. She was discharged home in good condition. She was given a prescription for Bentyl and Prilosec. She is advised to follow-up with her PCP. Return precautions were given.  Final Clinical Impressions(s) / ED Diagnoses   Final diagnoses:  Pain of upper abdomen    New Prescriptions New Prescriptions   OMEPRAZOLE (PRILOSEC) 20 MG CAPSULE    Take 1 capsule (20 mg total) by mouth daily.     Malvin Johns, MD 03/09/17 1434

## 2017-03-09 NOTE — ED Notes (Signed)
Pt directed to pharmacy to pick up Rx 

## 2017-03-09 NOTE — ED Triage Notes (Signed)
c/o "muscle spasms" to abd and lower back-hx of same-NAD-steady gait

## 2017-03-13 ENCOUNTER — Telehealth: Payer: Self-pay | Admitting: Pulmonary Disease

## 2017-03-13 NOTE — Telephone Encounter (Signed)
Spoke with pt. States that she is having issues with her vision. Pt wanted to know if she should see her eye doctor or come see RA. Advised her that Sarcoid could cause issues with her eyes and that she call her eye doctor for an appointment. She verbalized understanding. Nothing further was needed.

## 2017-03-13 NOTE — Telephone Encounter (Signed)
[phone] pt calling back

## 2017-03-13 NOTE — Telephone Encounter (Signed)
lmtcb x1 for pt. 

## 2017-03-23 ENCOUNTER — Ambulatory Visit (INDEPENDENT_AMBULATORY_CARE_PROVIDER_SITE_OTHER): Payer: BLUE CROSS/BLUE SHIELD | Admitting: Adult Health

## 2017-03-23 ENCOUNTER — Encounter: Payer: Self-pay | Admitting: Adult Health

## 2017-03-23 DIAGNOSIS — D869 Sarcoidosis, unspecified: Secondary | ICD-10-CM

## 2017-03-23 DIAGNOSIS — E039 Hypothyroidism, unspecified: Secondary | ICD-10-CM

## 2017-03-23 MED ORDER — LEVOTHYROXINE SODIUM 125 MCG PO TABS: 125.0000 ug | ORAL_TABLET | Freq: Every day | ORAL | 0 refills | Status: AC

## 2017-03-23 NOTE — Patient Instructions (Addendum)
Follow up with Eye doctor as recommended. Continue on current regimen  Low salt diet , work on weight loss.  Follow up with new PCP , will need thyroid med refill with them going forward.  Follow up with Dr. Elsworth Soho  In 6 months and As needed

## 2017-03-23 NOTE — Assessment & Plan Note (Signed)
Refill of synthroid x 1 month  Keep follow up next month with PCP

## 2017-03-23 NOTE — Assessment & Plan Note (Signed)
No obvious flare off chronic steroids .  Advised to keep follow up with eye doctor.  Keep eye on nausea as previous dx made with gallbladder bx.  For now remain off steroids , encouarged on wt loss.   Plan  Patient Instructions  Follow up with Eye doctor as recommended. Continue on current regimen  Low salt diet , work on weight loss.  Follow up with new PCP , will need thyroid med refill with them going forward.  Follow up with Dr. Elsworth Soho  In 6 months and As needed

## 2017-03-23 NOTE — Progress Notes (Signed)
@Patient  ID: Elizabeth Riggs, female    DOB: 1971-01-31, 46 y.o.   MRN: [id number]  Chief Complaint  Patient presents with  . Follow-up    Sarcoid     Referring provider: No ref. 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  She presented in 02/2015 with weight loss, intractable nausea and vomiting Had lap chole 03/12/15 and was told that she had a growth on her GB  Pathology showed non-caseating granulomas.   Significant tests/ events  Chest x-ray 12/2014 showed right hilar prominence which was new compared to 05/2014 CT chest 03/2015 - mediastinal lymphadenopathy-subcarinal and precarinal, scattered subcentimeter nodules CT abdomen from 2010 -clear bases PFTs 04/2015 -nml  Echo 08/2015 showed nml EF,Mild LVH c/w HTN   03/23/2017 Follow up : Sarcoid  Pt returns for 6 month follow up for Sarcoid . She has been on chronic steroids in past but has been off for around 4 months.  She says overall she is doing okay. Does have occasional joint aches.  No flare of cough . She has been referred to opthalmologist for vision changes and floaters.  She has to make ov with them . Encouraged her to keep this referral as with sarcoid can affect eye.  She did have some nausea and was recently started on PPI>  Has occasional ankle edema. Rarely takes lasix.  Has established with new PCP . Needs refill on synthroid to get thru 1 additonal month. TSH last ov was nml.    Allergies  Allergen Reactions  . Morphine And Related Itching    Immunization History  Administered Date(s) Administered  . Influenza Split 12/23/2016    Past Medical History:  Diagnosis Date  . Chronic pain of both knees   . Gallstones   . Hypertension   . Obesity   . Sarcoidosis ([place])   . Thyroid disease     Tobacco History: History  Smoking Status  . Never Smoker  Smokeless Tobacco  . Never Used   Counseling given: Not Answered   Outpatient Encounter  Prescriptions as of 03/23/2017  Medication Sig  . diclofenac (VOLTAREN) 75 MG EC tablet Take 1 tablet (75 mg total) by mouth 2 (two) times daily.  Marland Kitchen dicyclomine (BENTYL) 20 MG tablet Take 1 tablet (20 mg total) by mouth 2 (two) times daily.  . furosemide (LASIX) 20 MG tablet Take 1 tablet (20 mg total) by mouth daily.  . nitroGLYCERIN (NITRODUR - DOSED IN MG/24 HR) 0.2 mg/hr patch Apply 1/4th patch to affected area, change daily  . nystatin (MYCOSTATIN) 100000 UNIT/ML suspension Swish, gargle, and spit 5 mL 3-4 times per day.  Marland Kitchen omeprazole (PRILOSEC) 20 MG capsule Take 1 capsule (20 mg total) by mouth daily.  . ondansetron (ZOFRAN ODT) 4 MG disintegrating tablet Take 1 tablet (4 mg total) by mouth every 8 (eight) hours as needed for nausea or vomiting.  . pantoprazole (PROTONIX) 20 MG tablet Take 1 tablet (20 mg total) by mouth daily.  . potassium chloride SA (K-DUR,KLOR-CON) 20 MEQ tablet Take 40 mEq by mouth 2 (two) times daily.  Marland Kitchen spironolactone (ALDACTONE) 50 MG tablet Take 50 mg by mouth daily.  Marland Kitchen levothyroxine (SYNTHROID, LEVOTHROID) 125 MCG tablet Take 1 tablet (125 mcg total) by mouth daily. (Patient not taking: Reported on 03/23/2017)  . [DISCONTINUED] predniSONE (DELTASONE) 20 MG tablet Take 3 tablets (60 mg total) by mouth daily. (Patient not taking: Reported on 03/23/2017)   No facility-administered encounter medications on file  as of 03/23/2017.      Review of Systems  Constitutional:   No  weight loss, night sweats,  Fevers, chills, + fatigue, or  lassitude.  HEENT:   No headaches,  Difficulty swallowing,  Tooth/dental problems, or  Sore throat,                No sneezing, itching, ear ache, nasal congestion, post nasal drip,   CV:  No chest pain,  Orthopnea, PND,  , anasarca, dizziness, palpitations, syncope.   GI  No heartburn, indigestion, abdominal pain,   vomiting, diarrhea, change in bowel habits, loss of appetite, bloody stools.   Resp: No shortness of breath with  exertion or at rest.  No excess mucus, no productive cough,  No non-productive cough,  No coughing up of blood.  No change in color of mucus.  No wheezing.  No chest wall deformity  Skin: no rash or lesions.  GU: no dysuria, change in color of urine, no urgency or frequency.  No flank pain, no hematuria   MS:   No decreased range of motion.  No back pain.    Physical Exam  BP 131/81 (BP Location: Left Arm, Cuff Size: Large)   Pulse 81   Ht 5\' 5"  (1.651 m)   Wt 257 lb (116.6 kg)   SpO2 96%   BMI 42.77 kg/m   GEN: A/Ox3; pleasant , NAD, obese    HEENT:  [place]/AT,  EACs-clear, TMs-wnl, NOSE-clear, THROAT-clear, no lesions, no postnasal drip or exudate noted.   NECK:  Supple w/ fair ROM; no JVD; normal carotid impulses w/o bruits; no thyromegaly or nodules palpated; no lymphadenopathy.    RESP  Clear  P & A; w/o, wheezes/ rales/ or rhonchi. no accessory muscle use, no dullness to percussion  CARD:  RRR, no m/r/g, no peripheral edema, pulses intact, no cyanosis or clubbing.  GI:   Soft & nt; nml bowel sounds; no organomegaly or masses detected.   Musco: Warm bil, no deformities or joint swelling noted.   Neuro: alert, no focal deficits noted.    Skin: Warm, no lesions or rashes    Lab Results:   BNP No results found for: BNP  ProBNP No results found for: PROBNP  Imaging: Dg Abd Acute W/chest  Result Date: 03/09/2017 CLINICAL DATA:  Abdominal pain, cramping EXAM: DG ABDOMEN ACUTE W/ 1V CHEST COMPARISON:  CT 12/13/2016 FINDINGS: Mild peribronchial thickening. Heart is upper limits normal in size. No confluent opacities or effusions. There is normal bowel gas pattern. No free air. No organomegaly or suspicious calcification. No acute bony abnormality. IMPRESSION: Bronchitic changes. No evidence of bowel obstruction or free air. Electronically Signed   By: Rolm Baptise M.D.   On: 03/09/2017 13:29     Assessment & Plan:   Sarcoidosis No obvious flare off chronic steroids  .  Advised to keep follow up with eye doctor.  Keep eye on nausea as previous dx made with gallbladder bx.  For now remain off steroids , encouarged on wt loss.   Plan  Patient Instructions  Follow up with Eye doctor as recommended. Continue on current regimen  Low salt diet , work on weight loss.  Follow up with new PCP , will need thyroid med refill with them going forward.  Follow up with Dr. Elsworth Soho  In 6 months and As needed       Hypothyroidism Refill of synthroid x 1 month  Keep follow up next month with PCP  Rexene Edison, NP 03/23/2017

## 2017-03-23 NOTE — Addendum Note (Signed)
Addended by: Len Blalock on: 03/23/2017 10:45 AM   Modules accepted: Orders

## 2017-03-30 NOTE — Progress Notes (Signed)
Reviewed & agree with plan  

## 2017-03-31 ENCOUNTER — Ambulatory Visit (INDEPENDENT_AMBULATORY_CARE_PROVIDER_SITE_OTHER): Payer: BLUE CROSS/BLUE SHIELD | Admitting: Family Medicine

## 2017-03-31 ENCOUNTER — Encounter: Payer: Self-pay | Admitting: Family Medicine

## 2017-03-31 DIAGNOSIS — M25511 Pain in right shoulder: Secondary | ICD-10-CM | POA: Diagnosis not present

## 2017-03-31 MED ORDER — OXYCODONE-ACETAMINOPHEN 5-325 MG PO TABS: 1.0000 | ORAL_TABLET | Freq: Four times a day (QID) | ORAL | 0 refills | Status: DC | PRN

## 2017-03-31 NOTE — Patient Instructions (Signed)
You have rotator cuff impingement with severe trapezius spasms. Try to avoid painful activities (overhead activities, lifting with extended arm) as much as possible. Aleve 2 tabs twice a day with food OR ibuprofen 3 tabs three times a day with food for pain and inflammation. Can take tylenol in addition to this. Subacromial injection may be beneficial to help with pain and to decrease inflammation. Start physical therapy with transition to home exercise program. Do home exercise program with theraband and scapular stabilization exercises daily - these are very important for long term relief even if an injection was given - 3 sets of 10 once a day. If not improving at follow-up we will consider further imaging (MRI), injection. Follow up with me in 6 weeks for reevaluation but call me sooner if you're struggling and want to try the injection or do an MRI.

## 2017-04-03 NOTE — Progress Notes (Signed)
PCP: Sinclair Ship, MD  Subjective:   HPI: Patient is a 46 y.o. female here for right shoulder pain.  3/7: Patient reports for about 2 months she's had worsening lateral right shoulder pain. No injury or trauma. Pain is a tightness, worse with overhead reaching at 7/10 level. Dong home exercises, using heating pad. No help with medications. No skin changes, numbness.  4/6: Patient returns with continued pain in right shoulder. States nitro patches caused a rash so stopped using these. Pain is 8/10 and sharp. Worse with any motion of this shoulder. Also with a large knot behind clavicle area in trapezius. Has been doing home exercises, icing and heating. Still at work, lifting patients. No skin changes, numbness.  Past Medical History:  Diagnosis Date  . Chronic pain of both knees   . Gallstones   . Hypertension   . Obesity   . Sarcoidosis ([place])   . Thyroid disease     Current Outpatient Prescriptions on File Prior to Visit  Medication Sig Dispense Refill  . diclofenac (VOLTAREN) 75 MG EC tablet Take 1 tablet (75 mg total) by mouth 2 (two) times daily. 60 tablet 1  . dicyclomine (BENTYL) 20 MG tablet Take 1 tablet (20 mg total) by mouth 2 (two) times daily. 20 tablet 0  . furosemide (LASIX) 20 MG tablet Take 1 tablet (20 mg total) by mouth daily. 30 tablet 0  . levothyroxine (SYNTHROID, LEVOTHROID) 125 MCG tablet Take 1 tablet (125 mcg total) by mouth daily. 30 tablet 0  . nitroGLYCERIN (NITRODUR - DOSED IN MG/24 HR) 0.2 mg/hr patch Apply 1/4th patch to affected area, change daily 30 patch 1  . nystatin (MYCOSTATIN) 100000 UNIT/ML suspension Swish, gargle, and spit 5 mL 3-4 times per day. 120 mL 0  . omeprazole (PRILOSEC) 20 MG capsule Take 1 capsule (20 mg total) by mouth daily. 30 capsule 0  . ondansetron (ZOFRAN ODT) 4 MG disintegrating tablet Take 1 tablet (4 mg total) by mouth every 8 (eight) hours as needed for nausea or vomiting. 20 tablet 0  . pantoprazole (PROTONIX)  20 MG tablet Take 1 tablet (20 mg total) by mouth daily. 14 tablet 0  . potassium chloride SA (K-DUR,KLOR-CON) 20 MEQ tablet Take 40 mEq by mouth 2 (two) times daily.    Marland Kitchen spironolactone (ALDACTONE) 50 MG tablet Take 50 mg by mouth daily.     No current facility-administered medications on file prior to visit.     Past Surgical History:  Procedure Laterality Date  . ABDOMINAL HYSTERECTOMY    . CHOLECYSTECTOMY  03/12/15   [facility] in [place]  . KNEE SURGERY    . right knee surgery      Allergies  Allergen Reactions  . Morphine And Related Itching    Social History   Social History  . Marital status: Single    Spouse name: N/A  . Number of children: N/A  . Years of education: N/A   Occupational History  . Not on file.   Social History Main Topics  . Smoking status: Never Smoker  . Smokeless tobacco: Never Used  . Alcohol use No  . Drug use: No  . Sexual activity: Not on file   Other Topics Concern  . Not on file   Social History Narrative   She works as a Quarry manager at [employer]   3 children- all live locally, she has 3 grand children.   30- son   44- son  22- son   Completed 12th grade   Enjoys church, spending time with grand children.       Family History  Problem Relation Age of Onset  . Hypertension Mother   . Diabetes Father   . Hypertension Father   . Cancer Maternal Grandmother     breast  . Cancer Maternal Grandfather     prostate  . Heart attack Neg Hx     BP 130/82   Pulse 80   Ht 5\' 5"  (1.651 m)   Wt 245 lb (111.1 kg)   BMI 40.77 kg/m   Review of Systems: See HPI above.     Objective:  Physical Exam:  Gen: NAD, comfortable in exam room  Right shoulder: Palpable spasm right trapezius.  No swelling, ecchymoses.  No gross deformity. No TTP. Full ER, only 130 degrees abduction and flexion - painful. Positive Hawkins, Neers. Negative Yergasons. Strength 5/5 with empty can and resisted  internal/external rotation.  Pain empty can. Negative apprehension. NV intact distally.  Left shoulder: FROM without pain.   Assessment & Plan:  1. Right shoulder pain - 2/2 rotator cuff impingement with large trapezius spasm currently.  We discussed options at this point - she will start physical therapy in addition to her home exercises.  Stopped nitro due to skin irritation/rash.  Consider injection, MRI if still not improving.  F/u in 6 weeks.  Aleve or ibuprofen if needed.

## 2017-04-03 NOTE — Assessment & Plan Note (Signed)
2/2 rotator cuff impingement with large trapezius spasm currently.  We discussed options at this point - she will start physical therapy in addition to her home exercises.  Stopped nitro due to skin irritation/rash.  Consider injection, MRI if still not improving.  F/u in 6 weeks.  Aleve or ibuprofen if needed.

## 2017-04-06 ENCOUNTER — Ambulatory Visit: Payer: BLUE CROSS/BLUE SHIELD | Attending: Family Medicine | Admitting: Physical Therapy

## 2017-04-06 DIAGNOSIS — R293 Abnormal posture: Secondary | ICD-10-CM | POA: Insufficient documentation

## 2017-04-06 DIAGNOSIS — M25522 Pain in left elbow: Secondary | ICD-10-CM | POA: Diagnosis present

## 2017-04-06 DIAGNOSIS — M6281 Muscle weakness (generalized): Secondary | ICD-10-CM | POA: Insufficient documentation

## 2017-04-06 DIAGNOSIS — M25511 Pain in right shoulder: Secondary | ICD-10-CM | POA: Insufficient documentation

## 2017-04-06 NOTE — Therapy (Signed)
[facility] [address]  [place], [place], [address] Phone: [phone]   Fax:  [phone]  Physical Therapy Evaluation  Patient Details  Name: Elizabeth Riggs MRN: [id number] Date of Birth: 04-06-1971 Referring Provider: Dr. Karlton Lemon  Encounter Date: 04/06/2017      PT End of Session - 04/06/17 0933    Visit Number 1   Number of Visits 12   Date for PT Re-Evaluation 05/18/17   PT Start Time 0856   PT Stop Time 0950   PT Time Calculation (min) 54 min   Activity Tolerance Patient tolerated treatment well   Behavior During Therapy [facility] for tasks assessed/performed      Past Medical History:  Diagnosis Date  . Chronic pain of both knees   . Gallstones   . Hypertension   . Obesity   . Sarcoidosis ([place])   . Thyroid disease     Past Surgical History:  Procedure Laterality Date  . ABDOMINAL HYSTERECTOMY    . CHOLECYSTECTOMY  03/12/15   [facility] in [place]  . KNEE SURGERY    . right knee surgery      There were no vitals filed for this visit.       Subjective Assessment - 04/06/17 0857    Subjective Patient R shoulder pain. 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 Does continue to lift patients and work at full capacity - trying to use a standing lift. Overhead and reaching tends to be most bothersome. Diffiuclty putting clothes on. Pain meds seems to be the only thing that helps alleviate pain. Denies N&T into arm and hand. Does have some spasming of R shoulder.    Diagnostic tests none   Currently in Pain? No/denies   Pain Score --  up to 10/10 at work   Pain Location Shoulder   Pain Orientation Right   Pain Descriptors / Indicators Aching;Tightness;Sore   Pain Type Acute pain   Pain Onset More than a month ago   Pain Frequency Intermittent   Aggravating Factors  overhead, reaching   Pain Relieving Factors none            [facility] PT Assessment -  04/06/17 0901      Assessment   Medical Diagnosis Acute pain of R shoulder   Referring Provider Dr. Karlton Lemon   Onset Date/Surgical Date --  3-4 weeks ago   Hand Dominance Right   Next MD Visit --  3-4 weeks   Prior Therapy yes - not for this issue     Precautions   Precautions None     Restrictions   Weight Bearing Restrictions No     Balance Screen   Has the patient fallen in the past 6 months No   Has the patient had a decrease in activity level because of a fear of falling?  No   Is the patient reluctant to leave their home because of a fear of falling?  No     Home Ecologist residence     Prior Function   Level of Independence Independent   Vocation Full time employment   Vocation Requirements CNA - [employer]     Cognition   Overall Cognitive Status Within Functional Limits for tasks assessed     Observation/Other Assessments   Focus on Therapeutic Outcomes (FOTO)  Shoulder: 50 (50% limited, predicted 28% limited)     Sensation  Light Touch Appears Intact     Coordination   Gross Motor Movements are Fluid and Coordinated Yes     Posture/Postural Control   Posture/Postural Control Postural limitations   Postural Limitations Rounded Shoulders;Forward head     ROM / Strength   AROM / PROM / Strength AROM;PROM;Strength     AROM   Overall AROM Comments pain limiting all motions   AROM Assessment Site Shoulder   Right/Left Shoulder Right   Right Shoulder Flexion 87 Degrees   Right Shoulder ABduction 121 Degrees   Right Shoulder Internal Rotation --  Functional IR to approx. L5   Right Shoulder External Rotation --  functional ER to lateral ear     PROM   Overall PROM Comments pain at end ranges   PROM Assessment Site Shoulder   Right/Left Shoulder Right   Right Shoulder Flexion 150 Degrees   Right Shoulder ABduction 160 Degrees   Right Shoulder Internal Rotation 85 Degrees   Right Shoulder External Rotation 90  Degrees     Strength   Overall Strength Comments not assessed due to pain with activie movements; will assess in the future     Palpation   Palpation comment tender to palpation along R anterior shoulder, posterior capsule, R UT     Special Tests    Special Tests Rotator Cuff Impingement   Rotator Cuff Impingment tests Michel Bickers test     Hawkins-Kennedy test   Findings Positive   Side Right                   [facility] Adult PT Treatment/Exercise - 04/06/17 0901      Modalities   Modalities Electrical Stimulation;Moist Heat     Moist Heat Therapy   Number Minutes Moist Heat 15 Minutes   Moist Heat Location Shoulder     Electrical Stimulation   Electrical Stimulation Location R shoulder complex   Electrical Stimulation Action IFC   Electrical Stimulation Parameters to tolerance   Electrical Stimulation Goals Pain;Tone                PT Education - 04/06/17 0932    Education provided Yes   Education Details exam findings, POC, HEP   Person(s) Educated Patient   Methods Explanation;Demonstration;Handout   Comprehension Verbalized understanding;Returned demonstration;Need further instruction             PT Long Term Goals - 04/06/17 0947      PT LONG TERM GOAL #1   Title Patient to be independent with advanced HEP (05/18/17)   Status New     PT LONG TERM GOAL #2   Title Patient to improve R shoulder AROM equal to that of L shoulder without pain limiting motion (05/18/17)   Status New     PT LONG TERM GOAL #3   Title Patient to demonstrate good postural awareness with ability ot self-correct facilitating improved biomechaincs at R [facility] joint (05/18/17)   Status New     PT LONG TERM GOAL #4   Title Patient to demonstrate ability to perform overhead activities as well as full work duties without pain limiting (05/18/17)   Status New               Plan - 04/06/17 0933    Clinical Impression Statement Patient is a 46 y/o female  presenting to [place] today for initial evaluation for primary complaints of R shoulder pain with no known mechanism of injury. Patient today with reduced AROM of R shoulder  with pain limiting, pain at end ranges of PROM, as well as poor posturing likely contributing to poor biomechaincs at R shoulder as well as impingment type symptoms. Patient also with tenderness to palpation of R UT area with palpable tightness noted. Patient to benefit from PT intervention to address ROM, posture, and strength to facilitate improve functional use of R UE to allow for full return to work and leisure activities without pain limiting.    Rehab Potential Good   PT Frequency 2x / week   PT Duration 6 weeks   PT Treatment/Interventions ADLs/Self Care Home Management;Cryotherapy;Electrical Stimulation;Iontophoresis 4mg /ml Dexamethasone;Moist Heat;Ultrasound;Neuromuscular re-education;Therapeutic exercise;Therapeutic activities;Patient/family education;Manual techniques;Passive range of motion;Vasopneumatic Device;Taping;Dry needling   PT Next Visit Plan manual, gentle ROM and strengthening, postural re-ed   Consulted and Agree with Plan of Care Patient      Patient will benefit from skilled therapeutic intervention in order to improve the following deficits and impairments:  Pain, Impaired UE functional use, Postural dysfunction, Decreased activity tolerance, Decreased range of motion, Decreased strength, Increased muscle spasms  Visit Diagnosis: Acute pain of right shoulder - Plan: PT plan of care cert/re-cert  Abnormal posture - Plan: PT plan of care cert/re-cert  Muscle weakness (generalized) - Plan: PT plan of care cert/re-cert     Problem List Patient Active Problem List   Diagnosis Date Noted  . Right shoulder pain 03/03/2017  . Left elbow pain 11/09/2016  . Class 3 obesity due to excess calories without serious comorbidity with body mass index (BMI) of 40.0 to 44.9 in adult ([place]) 11/02/2016  . GERD  (gastroesophageal reflux disease) 07/10/2015  . Geographic tongue 04/28/2015  . Oral thrush 04/28/2015  . Sarcoidosis ([place]) 04/09/2015  . Hyperglycemia 04/08/2015  . Hypokalemia 03/31/2015  . Constipation 03/19/2015  . Abdominal pain, epigastric 02/18/2015  . Hypothyroidism 02/18/2015  . HTN (hypertension) 02/18/2015  . Bilateral knee pain 05/12/2011     Lanney Gins, PT, DPT 04/06/17 10:22 AM   [facility] [address]  [place], [place], [address] Phone: [phone]   Fax:  [phone]  Name: Taleisha Kaczynski MRN: [id number] Date of Birth: 11/23/1971

## 2017-04-06 NOTE — Patient Instructions (Signed)
Flexibility: Upper Trapezius Stretch    Gently grasp right side of head while reaching behind back with other hand. Tilt head away until a gentle stretch is felt. Hold ___30_ seconds. Repeat __3__ times per set. Do _2-3___ sessions per day.   Scapular Retraction (Standing)    With arms at sides, pinch shoulder blades together. Repeat __15__ times per set. Do _2-3___ sessions per day.   Levator Scapula Stretch, Sitting    Sit, one hand tucked under hip on side to be stretched, other hand over top of head. Turn head toward other side and look down. Use hand on head to gently stretch neck in that position. Hold _30__ seconds. Repeat _3__ times per session. Do _2-3__ sessions per day.

## 2017-04-10 ENCOUNTER — Ambulatory Visit: Payer: BLUE CROSS/BLUE SHIELD

## 2017-04-11 ENCOUNTER — Ambulatory Visit: Payer: BLUE CROSS/BLUE SHIELD

## 2017-04-11 DIAGNOSIS — M6281 Muscle weakness (generalized): Secondary | ICD-10-CM

## 2017-04-11 DIAGNOSIS — R293 Abnormal posture: Secondary | ICD-10-CM

## 2017-04-11 DIAGNOSIS — M25511 Pain in right shoulder: Secondary | ICD-10-CM

## 2017-04-11 DIAGNOSIS — M25522 Pain in left elbow: Secondary | ICD-10-CM

## 2017-04-11 NOTE — Therapy (Signed)
[facility] [address]  [place], [place], [address] Phone: [phone]   Fax:  [phone]  Physical Therapy Treatment  Patient Details  Name: Elizabeth Riggs MRN: [id number] Date of Birth: [birth year] Referring Provider: Dr. Karlton Lemon  Encounter Date: 04/11/2017      PT End of Session - 04/11/17 1625    Visit Number 2   Number of Visits 12   Date for PT Re-Evaluation 05/18/17   PT Start Time 1829   PT Stop Time 1721   PT Time Calculation (min) 64 min   Activity Tolerance Patient tolerated treatment well   Behavior During Therapy [facility] for tasks assessed/performed      Past Medical History:  Diagnosis Date  . Chronic pain of both knees   . Gallstones   . Hypertension   . Obesity   . Sarcoidosis   . Thyroid disease     Past Surgical History:  Procedure Laterality Date  . ABDOMINAL HYSTERECTOMY    . CHOLECYSTECTOMY  03/12/15   [facility] in [place]  . KNEE SURGERY    . right knee surgery      There were no vitals filed for this visit.      Subjective Assessment - 04/11/17 1622    Subjective Pt. reporting she has been performing exercises however seom cause pain.  Pt. unsure if she is performing HEP currectly.     Currently in Pain? No/denies   Pain Score 0-No pain   Multiple Pain Sites No                         [facility] Adult PT Treatment/Exercise - 04/11/17 1628      Shoulder Exercises: Supine   External Rotation AROM;Right;10 reps   Theraband Level (Shoulder External Rotation) Level 1 ([place]ellow)   Internal Rotation AROM;Right;10 reps   Theraband Level (Shoulder Internal Rotation) Level 1 ([place]ellow)   Flexion AROM;10 reps;Strengthening;Both   Flexion Limitations wand    Other Supine Exercises B Horizontal abduction laying on 1/2 bolster 5" x 10 reps     Shoulder Exercises: Seated   Retraction AROM;10 reps;Strengthening   Retraction Limitations 5" hold;  heavy cueing to avoid elevation     Shoulder Exercises: Standing   Retraction AROM;10 reps  5" hold    Retraction Limitations leaning on doorseal   heavy cueing to avoid scap. elevation      Shoulder Exercises: ROM/Strengthening   UBE (Upper Arm Bike) Lvl 1.0, 3 min each way      Moist Heat Therapy   Number Minutes Moist Heat 15 Minutes   Moist Heat Location Shoulder     Electrical Stimulation   Electrical Stimulation Location R shoulder complex   Electrical Stimulation Action IFC   Electrical Stimulation Parameters 80-150Hz , to tolerance, 15'     Electrical Stimulation Goals Pain;Tone     Manual Therapy   Manual Therapy Soft tissue mobilization;Myofascial release;Passive ROM   Manual therapy comments Significant muscular tension in R UT, Levator scap. and R shoulder muscular    Soft tissue mobilization STM to R UT, Levator scap., medial scapular border   Myofascial Release TPR to R anterior UT just superior to clavicle    Passive ROM PROM all directions; pain free; R teres minor      Neck Exercises: Stretches   Upper Trapezius Stretch 30 seconds;2 reps   Upper Trapezius Stretch Limitations R UT only    Levator Stretch  30 seconds;2 reps   Levator Stretch Limitations R Levator only    Government social research officer Limitations Just low version at doorframe    Other Neck Stretches Chest stretch in cross position laying on 1/2 foam bolster                      PT Long Term Goals - 04/11/17 1625      PT LONG TERM GOAL #1   Title Patient to be independent with advanced HEP (05/18/17)   Status On-going     PT LONG TERM GOAL #2   Title Patient to improve R shoulder AROM equal to that of L shoulder without pain limiting motion (05/18/17)   Status On-going     PT LONG TERM GOAL #3   Title Patient to demonstrate good postural awareness with ability ot self-correct facilitating improved biomechaincs at R [facility] joint (05/18/17)   Status On-going     PT LONG TERM  GOAL #4   Title Patient to demonstrate ability to perform overhead activities as well as full work duties without pain limiting (05/18/17)   Status On-going               Plan - 04/11/17 1630    Clinical Impression Statement Pt. tolerated STM/TPR to R shoulder muscular, gentle R shoulder strengthening, and postural re-education activities well today.  Pain well controlled throughout treatment.  Pt. requiring heavy cueing with HEP review and therex today to avoid scapular elevation.  Still with significant tone in R UT and Levator scap. today thus TPR/STM to decrease tone.  Pt. able to demo HEP with correct technique following review today.  E-stim/moist heat to R shoulder musculature to end treatment to decrease post-exercise pain and decrease tone.     PT Treatment/Interventions ADLs/Self Care Home Management;Cryotherapy;Electrical Stimulation;Iontophoresis 4mg /ml Dexamethasone;Moist Heat;Ultrasound;Neuromuscular re-education;Therapeutic exercise;Therapeutic activities;Patient/family education;Manual techniques;Passive range of motion;Vasopneumatic Device;Taping;Dry needling   PT Next Visit Plan manual, gentle ROM and strengthening, postural re-ed      Patient will benefit from skilled therapeutic intervention in order to improve the following deficits and impairments:  Pain, Impaired UE functional use, Postural dysfunction, Decreased activity tolerance, Decreased range of motion, Decreased strength, Increased muscle spasms  Visit Diagnosis: Acute pain of right shoulder  Abnormal posture  Muscle weakness (generalized)  Pain in left elbow     Problem List Patient Active Problem List   Diagnosis Date Noted  . Right shoulder pain 03/03/2017  . Left elbow pain 11/09/2016  . Class 3 obesity due to excess calories without serious comorbidity with body mass index (BMI) of 40.0 to 44.9 in adult ([place]) 11/02/2016  . GERD (gastroesophageal reflux disease) 07/10/2015  . Geographic tongue  04/28/2015  . Oral thrush 04/28/2015  . Sarcoidosis 04/09/2015  . Hyperglycemia 04/08/2015  . Hypokalemia 03/31/2015  . Constipation 03/19/2015  . Abdominal pain, epigastric 02/18/2015  . Hypothyroidism 02/18/2015  . HTN (hypertension) 02/18/2015  . Bilateral knee pain 05/12/2011    Bess Harvest, PTA 04/11/17 5:33 PM   [facility] [address]  [place], [place], [address] Phone: [phone]   Fax:  [phone]  Name: Elizabeth Riggs MRN: [id number] Date of Birth: 09-27-1971

## 2017-04-12 ENCOUNTER — Ambulatory Visit: Payer: BLUE CROSS/BLUE SHIELD | Admitting: Family Medicine

## 2017-04-13 ENCOUNTER — Ambulatory Visit: Payer: BLUE CROSS/BLUE SHIELD | Admitting: Physical Therapy

## 2017-04-13 DIAGNOSIS — M25511 Pain in right shoulder: Secondary | ICD-10-CM

## 2017-04-13 DIAGNOSIS — M6281 Muscle weakness (generalized): Secondary | ICD-10-CM

## 2017-04-13 DIAGNOSIS — R293 Abnormal posture: Secondary | ICD-10-CM

## 2017-04-13 NOTE — Therapy (Signed)
[facility] [address]  [place], [place], [address] Phone: [phone]   Fax:  [phone]  Physical Therapy Treatment  Patient Details  Name: Elizabeth Riggs MRN: [id number] Date of Birth: 12-01-1971 Referring Provider: Dr. Karlton Lemon  Encounter Date: 04/13/2017      PT End of Session - 04/13/17 0845    Visit Number 3   Number of Visits 12   Date for PT Re-Evaluation 05/18/17   PT Start Time 0845   PT Stop Time 0958   PT Time Calculation (min) 73 min   Activity Tolerance Patient tolerated treatment well   Behavior During Therapy [facility] for tasks assessed/performed      Past Medical History:  Diagnosis Date  . Chronic pain of both knees   . Gallstones   . Hypertension   . Obesity   . Sarcoidosis   . Thyroid disease     Past Surgical History:  Procedure Laterality Date  . ABDOMINAL HYSTERECTOMY    . CHOLECYSTECTOMY  03/12/15   [facility] in [place]  . KNEE SURGERY    . right knee surgery      There were no vitals filed for this visit.      Subjective Assessment - 04/13/17 0848    Subjective Pt denies pain at rest but states pain up to 10/10 with overhead lifting/reaching.   Currently in Pain? No/denies   Pain Score --  up to 10/10 with overhead activity                         [facility] Adult PT Treatment/Exercise - 04/13/17 0845      Shoulder Exercises: Supine   Horizontal ABduction Both;12 reps;Theraband   Theraband Level (Shoulder Horizontal ABduction) Level 2 (Red)   Horizontal ABduction Limitations hooklying on pool noodle     Shoulder Exercises: ROM/Strengthening   UBE (Upper Arm Bike) deferred d/t reports of increased pain during this last visit     Modalities   Modalities Electrical Stimulation;Moist Heat;Iontophoresis     Moist Heat Therapy   Number Minutes Moist Heat 15 Minutes   Moist Heat Location Shoulder  Rt     Electrical Stimulation    Electrical Stimulation Location R shoulder complex   Electrical Stimulation Action IFC   Electrical Stimulation Parameters 80-150Hz , to tolerance x 15'   Electrical Stimulation Goals Pain;Tone     Iontophoresis   Type of Iontophoresis Dexamethasone   Location R anterior shoulder (bicep origin)   Dose 1.0 ml, 80 mA-min   Time 4-6 hr patch     Manual Therapy   Manual Therapy Soft tissue mobilization;Myofascial release;Joint mobilization;Taping   Joint Mobilization R shoulder grade 1-2 progressing to 3-4 inferior glide, A/P and caudal mobs to promote increased joint space   Soft tissue mobilization STM to R UT, LS, pecs, subscap & teres minor   Myofascial Release TRP to R UT (mutiple locations), LS (at medial scapular border) & subscap/teres minor   Kinesiotex Inhibit Muscle     Kinesiotix   Inhibit Muscle  B UT & LS - 30% from insertion to origin     Neck Exercises: Stretches   Upper Trapezius Stretch 30 seconds;2 reps   Upper Trapezius Stretch Limitations manual    Levator Stretch 30 seconds;2 reps   Levator Stretch Limitations manual   Other Neck Stretches Pec stretch hooklying on pool noodle x2' - slight manual overpressure for scapular retraction & depression  PT Education - 04/13/17 [phone]    Education provided Yes   Education Details Ionto precautions & contraindications, role of kinesiotaping & wearing instructions   Person(s) Educated Patient   Methods Explanation;Handout   Comprehension Verbalized understanding             PT Long Term Goals - 04/11/17 1625      PT LONG TERM GOAL #1   Title Patient to be independent with advanced HEP (05/18/17)   Status On-going     PT LONG TERM GOAL #2   Title Patient to improve R shoulder AROM equal to that of L shoulder without pain limiting motion (05/18/17)   Status On-going     PT LONG TERM GOAL #3   Title Patient to demonstrate good postural awareness with ability ot self-correct facilitating  improved biomechaincs at R [facility] joint (05/18/17)   Status On-going     PT LONG TERM GOAL #4   Title Patient to demonstrate ability to perform overhead activities as well as full work duties without pain limiting (05/18/17)   Status On-going               Plan - 04/13/17 0942    Clinical Impression Statement Pt continues to demonstrate forward, rounded and elevated R shoulder with excessive muscle tension and mutiple TPs in R UT, LS, pecs and periscapular/RTC muscles. Pt noting good relief from STM/MFR with palpable decreased muscle and improved muscle length after manual therapy and stretching. Manual therapy followed by estim and moist heat to promote further muscle relaxation followed by kinesiotapting to inhibit B UT & LS. Ionto patch also applied to anterior R shoulder d/t irriation/inflammation noted over bieps origin.   Rehab Potential Good   PT Treatment/Interventions ADLs/Self Care Home Management;Cryotherapy;Electrical Stimulation;Iontophoresis 4mg /ml Dexamethasone;Moist Heat;Ultrasound;Neuromuscular re-education;Therapeutic exercise;Therapeutic activities;Patient/family education;Manual techniques;Passive range of motion;Vasopneumatic Device;Taping;Dry needling   PT Next Visit Plan assess response to ionto & taping, manual, gentle ROM and strengthening, postural re-ed   Consulted and Agree with Plan of Care Patient      Patient will benefit from skilled therapeutic intervention in order to improve the following deficits and impairments:  Pain, Impaired UE functional use, Postural dysfunction, Decreased activity tolerance, Decreased range of motion, Decreased strength, Increased muscle spasms  Visit Diagnosis: Acute pain of right shoulder  Abnormal posture  Muscle weakness (generalized)     Problem List Patient Active Problem List   Diagnosis Date Noted  . Right shoulder pain 03/03/2017  . Left elbow pain 11/09/2016  . Class 3 obesity due to excess calories without  serious comorbidity with body mass index (BMI) of 40.0 to 44.9 in adult ([place]) 11/02/2016  . GERD (gastroesophageal reflux disease) 07/10/2015  . Geographic tongue 04/28/2015  . Oral thrush 04/28/2015  . Sarcoidosis 04/09/2015  . Hyperglycemia 04/08/2015  . Hypokalemia 03/31/2015  . Constipation 03/19/2015  . Abdominal pain, epigastric 02/18/2015  . Hypothyroidism 02/18/2015  . HTN (hypertension) 02/18/2015  . Bilateral knee pain 05/12/2011    Percival Spanish, PT, MPT 04/13/2017, 10:17 AM  [facility] [address]  [place], [place], [address] Phone: [phone]   Fax:  [phone]  Name: Elizabeth Riggs MRN: [id number] Date of Birth: December 21, 1971

## 2017-04-13 NOTE — Patient Instructions (Signed)

## 2017-04-17 ENCOUNTER — Ambulatory Visit: Payer: BLUE CROSS/BLUE SHIELD | Admitting: Physical Therapy

## 2017-04-17 DIAGNOSIS — M25511 Pain in right shoulder: Secondary | ICD-10-CM | POA: Diagnosis not present

## 2017-04-17 DIAGNOSIS — M6281 Muscle weakness (generalized): Secondary | ICD-10-CM

## 2017-04-17 DIAGNOSIS — R293 Abnormal posture: Secondary | ICD-10-CM

## 2017-04-17 NOTE — Therapy (Signed)
[facility] [address]  [place], [place], [address] Phone: [phone]   Fax:  [phone]  Physical Therapy Treatment  Patient Details  Name: Elizabeth Riggs MRN: [id number] Date of Birth: April 10, 1971 Referring Provider: Dr. Karlton Lemon  Encounter Date: 04/17/2017      PT End of Session - 04/17/17 0914    Visit Number 4   Number of Visits 12   Date for PT Re-Evaluation 05/18/17   PT Start Time 0911   PT Stop Time 1017   PT Time Calculation (min) 66 min   Activity Tolerance Patient tolerated treatment well   Behavior During Therapy [facility] for tasks assessed/performed      Past Medical History:  Diagnosis Date  . Chronic pain of both knees   . Gallstones   . Hypertension   . Obesity   . Sarcoidosis   . Thyroid disease     Past Surgical History:  Procedure Laterality Date  . ABDOMINAL HYSTERECTOMY    . CHOLECYSTECTOMY  03/12/15   [facility] in [place]  . KNEE SURGERY    . right knee surgery      There were no vitals filed for this visit.      Subjective Assessment - 04/17/17 0913    Subjective needs treatment notes printed for insurance - had no pain with patch - took off tape due to some irritation at hair line   Diagnostic tests none   Patient Stated Goals improve function and reduce pain   Currently in Pain? No/denies   Pain Score 0-No pain  "good right now - just when I lift it, it pinches"                         [facility] Adult PT Treatment/Exercise - 04/17/17 0915      Shoulder Exercises: Supine   Horizontal ABduction Both;12 reps;Theraband   Theraband Level (Shoulder Horizontal ABduction) Level 2 (Red)   Horizontal ABduction Limitations hooklying on 1/2 foam roller   External Rotation Both;12 reps;Theraband   Theraband Level (Shoulder External Rotation) Level 2 (Red)   External Rotation Limitations hooklying on 1/2 foam roll     Shoulder Exercises:  Standing   Extension Strengthening;Both;12 reps;Theraband   Theraband Level (Shoulder Extension) Level 2 (Red)   Extension Limitations not going pat hip level - with scap squeeze   Row Strengthening;Both;12 reps;Theraband   Theraband Level (Shoulder Row) Level 2 (Red)   Row Limitations with scap squeeze     Shoulder Exercises: ROM/Strengthening   UBE (Upper Arm Bike) level 2 - 2 min fwd/ 2 min bwd     Modalities   Modalities Electrical Stimulation;Moist Heat;Iontophoresis     Moist Heat Therapy   Number Minutes Moist Heat 15 Minutes   Moist Heat Location Shoulder     Electrical Stimulation   Electrical Stimulation Location R shoulder complex   Electrical Stimulation Action IFC   Electrical Stimulation Parameters to tolerance   Electrical Stimulation Goals Pain;Tone     Iontophoresis   Type of Iontophoresis Dexamethasone   Location R anterior shoulder (bicep origin)   Dose 1.0 ml, 80 mA-min   Time 4-6 hr patch     Manual Therapy   Manual Therapy Soft tissue mobilization;Myofascial release   Soft tissue mobilization STM to R UT, LS, pecs, subscap & teres minor   Myofascial Release manual trigger point release to R UT, R pec region, R LS, and subscap/teres  region   Passive ROM PROM all directions - full motion, however pain at end ranges.    Kinesiotex --     Kinesiotix   Inhibit Muscle  --     Neck Exercises: Stretches   Upper Trapezius Stretch 2 reps;60 seconds   Upper Trapezius Stretch Limitations manual    Levator Stretch 1 rep;60 seconds   Levator Stretch Limitations manual   Other Neck Stretches Pec stretch hooklying on pool noodle x2'                     PT Long Term Goals - 04/11/17 1625      PT LONG TERM GOAL #1   Title Patient to be independent with advanced HEP (05/18/17)   Status On-going     PT LONG TERM GOAL #2   Title Patient to improve R shoulder AROM equal to that of L shoulder without pain limiting motion (05/18/17)   Status On-going      PT LONG TERM GOAL #3   Title Patient to demonstrate good postural awareness with ability ot self-correct facilitating improved biomechaincs at R [facility] joint (05/18/17)   Status On-going     PT LONG TERM GOAL #4   Title Patient to demonstrate ability to perform overhead activities as well as full work duties without pain limiting (05/18/17)   Status On-going               Plan - 04/17/17 0914    Clinical Impression Statement Patient doing well today - feels like she had great benefit from btoh taping and ionto patch from last session - doesn't know which one helped more, therefore onlyapplying ionto today to distinguish benefit between the two. Heavy manual therapy continued today for general tightness and manual trigger point release for hopeful improved pain and function at R shoulder complex. Patient doing well with light introduction into strengthening for periscpaular and RTC musculature. Patient to continue to beneift from PT to maximize functional use of R shoulder.    PT Treatment/Interventions ADLs/Self Care Home Management;Cryotherapy;Electrical Stimulation;Iontophoresis 4mg /ml Dexamethasone;Moist Heat;Ultrasound;Neuromuscular re-education;Therapeutic exercise;Therapeutic activities;Patient/family education;Manual techniques;Passive range of motion;Vasopneumatic Device;Taping;Dry needling   PT Next Visit Plan assess response to ionto & taping, manual, gentle ROM and strengthening, postural re-ed      Patient will benefit from skilled therapeutic intervention in order to improve the following deficits and impairments:  Pain, Impaired UE functional use, Postural dysfunction, Decreased activity tolerance, Decreased range of motion, Decreased strength, Increased muscle spasms  Visit Diagnosis: Acute pain of right shoulder  Abnormal posture  Muscle weakness (generalized)     Problem List Patient Active Problem List   Diagnosis Date Noted  . Right shoulder pain 03/03/2017   . Left elbow pain 11/09/2016  . Class 3 obesity due to excess calories without serious comorbidity with body mass index (BMI) of 40.0 to 44.9 in adult ([place]) 11/02/2016  . GERD (gastroesophageal reflux disease) 07/10/2015  . Geographic tongue 04/28/2015  . Oral thrush 04/28/2015  . Sarcoidosis 04/09/2015  . Hyperglycemia 04/08/2015  . Hypokalemia 03/31/2015  . Constipation 03/19/2015  . Abdominal pain, epigastric 02/18/2015  . Hypothyroidism 02/18/2015  . HTN (hypertension) 02/18/2015  . Bilateral knee pain 05/12/2011     Lanney Gins, PT, DPT 04/17/17 1:14 PM   [facility] [address]  [place], [place], [address] Phone: [phone]   Fax:  [phone]  Name: Elizabeth Riggs MRN: [id number] Date of Birth: June 14, 1971

## 2017-04-20 ENCOUNTER — Ambulatory Visit: Payer: BLUE CROSS/BLUE SHIELD | Admitting: Physical Therapy

## 2017-04-20 DIAGNOSIS — M6281 Muscle weakness (generalized): Secondary | ICD-10-CM

## 2017-04-20 DIAGNOSIS — M25511 Pain in right shoulder: Secondary | ICD-10-CM

## 2017-04-20 DIAGNOSIS — R293 Abnormal posture: Secondary | ICD-10-CM

## 2017-04-20 NOTE — Therapy (Signed)
[facility] [address]  [place], [place], [address] Phone: [phone]   Fax:  [phone]  Physical Therapy Treatment  Patient Details  Name: Elizabeth Riggs MRN: [id number] Date of Birth: [birth year] Referring Provider: Dr. Karlton Lemon  Encounter Date: 04/20/2017      PT End of Session - 04/20/17 1155    Visit Number 5   Number of Visits 12   Date for PT Re-Evaluation 05/18/17   PT Start Time 0850   PT Stop Time 0933   PT Time Calculation (min) 43 min   Activity Tolerance Patient tolerated treatment well   Behavior During Therapy [facility] for tasks assessed/performed      Past Medical History:  Diagnosis Date  . Chronic pain of both knees   . Gallstones   . Hypertension   . Obesity   . Sarcoidosis   . Thyroid disease     Past Surgical History:  Procedure Laterality Date  . ABDOMINAL HYSTERECTOMY    . CHOLECYSTECTOMY  03/12/15   [facility] in [place]  . KNEE SURGERY    . right knee surgery      There were no vitals filed for this visit.      Subjective Assessment - 04/20/17 0852    Subjective Feels like patch helped - has been having boyfriend perform massage and PROM to R UE   Patient Stated Goals improve function and reduce pain   Currently in Pain? No/denies   Pain Score 0-No pain                         [facility] Adult PT Treatment/Exercise - 04/20/17 0001      Shoulder Exercises: Supine   Horizontal ABduction Both;12 reps;Theraband   Theraband Level (Shoulder Horizontal ABduction) Level 2 (Red)   Horizontal ABduction Limitations hooklying on 1/2 foam roller   External Rotation Both;12 reps;Theraband   Theraband Level (Shoulder External Rotation) Level 2 (Red)   External Rotation Limitations hooklying on 1/2 foam roll   Flexion Strengthening;Right;12 reps;Theraband   Theraband Level (Shoulder Flexion) Level 2 (Red)   Flexion Limitations diagonals -  hooklying on 1/2 foam roll     Shoulder Exercises: Standing   External Rotation Strengthening;Right;15 reps;Theraband   Theraband Level (Shoulder External Rotation) Level 2 (Red)   Internal Rotation Strengthening;Right;15 reps;Theraband   Theraband Level (Shoulder Internal Rotation) Level 2 (Red)   Other Standing Exercises bicep curls with green tband x 15 reps     Shoulder Exercises: ROM/Strengthening   UBE (Upper Arm Bike) level 3 - 2 min fwd/ 2 min bwd     Modalities   Modalities Iontophoresis     Iontophoresis   Type of Iontophoresis Dexamethasone   Location R anterior shoulder (bicep origin)   Dose 1.0 ml, 80 mA-min   Time 4-6 hr patch     Manual Therapy   Manual Therapy Soft tissue mobilization;Myofascial release   Manual therapy comments patient hooklying   Soft tissue mobilization STM to R UT, LS, pecs, subscap & teres minor   Myofascial Release manual trigger point release to R UT and R lateral C-spine region   Passive ROM PROM in L lateral flexion for  UT stretch 2 x 1 minute; as well as into L rotation and flexion for R LS stretch 1 x 1 minute     Neck Exercises: Stretches   Upper Trapezius Stretch 2 reps;60 seconds   Upper Trapezius Stretch  Limitations manual    Levator Stretch 1 rep;60 seconds   Levator Stretch Limitations manual                     PT Long Term Goals - 04/11/17 1625      PT LONG TERM GOAL #1   Title Patient to be independent with advanced HEP (05/18/17)   Status On-going     PT LONG TERM GOAL #2   Title Patient to improve R shoulder AROM equal to that of L shoulder without pain limiting motion (05/18/17)   Status On-going     PT LONG TERM GOAL #3   Title Patient to demonstrate good postural awareness with ability ot self-correct facilitating improved biomechaincs at R [facility] joint (05/18/17)   Status On-going     PT LONG TERM GOAL #4   Title Patient to demonstrate ability to perform overhead activities as well as full work duties  without pain limiting (05/18/17)   Status On-going               Plan - 04/20/17 1156    Clinical Impression Statement Patient essentially pain free during treatment today - no pain with UBE, manual stretching or therapeutic exercise. Some pain ellicited with STM as well as manual trigger point relelase, however quickly resolved. Good progression of strengthening today with no pain, however heavy VC for proper form of movement. Ionto continued as patient finds most relief with this modality. WIll continue to progress functional strength of R UE.    PT Treatment/Interventions ADLs/Self Care Home Management;Cryotherapy;Electrical Stimulation;Iontophoresis 4mg /ml Dexamethasone;Moist Heat;Ultrasound;Neuromuscular re-education;Therapeutic exercise;Therapeutic activities;Patient/family education;Manual techniques;Passive range of motion;Vasopneumatic Device;Taping;Dry needling   PT Next Visit Plan assess response to ionto & taping, manual, gentle ROM and strengthening, postural re-ed   Consulted and Agree with Plan of Care Patient      Patient will benefit from skilled therapeutic intervention in order to improve the following deficits and impairments:  Pain, Impaired UE functional use, Postural dysfunction, Decreased activity tolerance, Decreased range of motion, Decreased strength, Increased muscle spasms  Visit Diagnosis: Acute pain of right shoulder  Abnormal posture  Muscle weakness (generalized)     Problem List Patient Active Problem List   Diagnosis Date Noted  . Right shoulder pain 03/03/2017  . Left elbow pain 11/09/2016  . Class 3 obesity due to excess calories without serious comorbidity with body mass index (BMI) of 40.0 to 44.9 in adult ([place]) 11/02/2016  . GERD (gastroesophageal reflux disease) 07/10/2015  . Geographic tongue 04/28/2015  . Oral thrush 04/28/2015  . Sarcoidosis 04/09/2015  . Hyperglycemia 04/08/2015  . Hypokalemia 03/31/2015  . Constipation  03/19/2015  . Abdominal pain, epigastric 02/18/2015  . Hypothyroidism 02/18/2015  . HTN (hypertension) 02/18/2015  . Bilateral knee pain 05/12/2011     Lanney Gins, PT, DPT 04/20/17 1:54 PM   [facility] [address]  [place], [place], [address] Phone: [phone]   Fax:  [phone]  Name: Cheralyn Oliver MRN: [id number] Date of Birth: Mar 01, 1971

## 2017-04-20 NOTE — Patient Instructions (Signed)
Internal Rotation (Eccentric), (Resistance Band)    Hold band with hand of affected arm. Pull band inward. Keep wrist neutral. Slowly return for 3-5 seconds. Use ____red____ resistance band. Hint: Use towel roll between elbow and waist or elbow and hip. _15__ reps per set, 2___ sets per day.   EXTERNAL ROTATION: Sitting - Resistance Band (Active)    STAND: right arm elbow bent to 90, elbow against side, hand forward. Against red resistance band, rotate forearm outward, keeping elbow at side. Rotate forearm outward as far as possible. Complete _2__ sets of __15_ repetitions.

## 2017-04-24 ENCOUNTER — Ambulatory Visit: Payer: BLUE CROSS/BLUE SHIELD

## 2017-04-25 ENCOUNTER — Ambulatory Visit: Payer: BLUE CROSS/BLUE SHIELD | Attending: Family Medicine

## 2017-04-25 DIAGNOSIS — R293 Abnormal posture: Secondary | ICD-10-CM | POA: Diagnosis present

## 2017-04-25 DIAGNOSIS — M6281 Muscle weakness (generalized): Secondary | ICD-10-CM

## 2017-04-25 DIAGNOSIS — M25511 Pain in right shoulder: Secondary | ICD-10-CM | POA: Diagnosis present

## 2017-04-25 NOTE — Therapy (Signed)
[facility] [address]  [place], [place], [address] Phone: [phone]   Fax:  [phone]  Physical Therapy Treatment  Patient Details  Name: Elizabeth Riggs MRN: [id number] Date of Birth: 13-Sep-1971 Referring Provider: Dr. Karlton Lemon  Encounter Date: 04/25/2017      PT End of Session - 04/25/17 1023    Visit Number 6   Number of Visits 12   Date for PT Re-Evaluation 05/18/17   PT Start Time 1018   PT Stop Time 1105   PT Time Calculation (min) 47 min   Activity Tolerance Patient tolerated treatment well   Behavior During Therapy [facility] for tasks assessed/performed      Past Medical History:  Diagnosis Date  . Chronic pain of both knees   . Gallstones   . Hypertension   . Obesity   . Sarcoidosis   . Thyroid disease     Past Surgical History:  Procedure Laterality Date  . ABDOMINAL HYSTERECTOMY    . CHOLECYSTECTOMY  03/12/15   [facility] in [place]  . KNEE SURGERY    . right knee surgery      There were no vitals filed for this visit.      Subjective Assessment - 04/25/17 1025    Patient Stated Goals improve function and reduce pain   Currently in Pain? No/denies  still 10/10 pain with reaching and putting on shirt    Pain Score 0-No pain   Multiple Pain Sites No                         [facility] Adult PT Treatment/Exercise - 04/25/17 1029      Shoulder Exercises: Supine   Horizontal ABduction Both;AROM;10 reps;Theraband   Theraband Level (Shoulder Horizontal ABduction) Level 3 (Green)   Horizontal ABduction Limitations hooklying on 1/2 foam roller   External Rotation Both;Theraband;10 reps   Theraband Level (Shoulder External Rotation) Level 2 (Red)   External Rotation Limitations hooklying on 1/2 foam roll   Other Supine Exercises alternating shoulder flexion + opposite extension (X pattern) with red TB laying on 1/2 foam bolster x 10 reps each side      Shoulder Exercises: Prone   Extension Right;AROM;10 reps;Weights;Strengthening  3" hold    Extension Weight (lbs) 3   External Rotation Right;AROM;Strengthening;10 reps  3" hold    External Rotation Limitations some cueing for proper motion    Horizontal ABduction 1 Right;AROM;Strengthening;10 reps     Shoulder Exercises: Standing   External Rotation Strengthening;Right;15 reps;Theraband   Theraband Level (Shoulder External Rotation) Level 2 (Red)   Internal Rotation Strengthening;Right;15 reps;Theraband   Theraband Level (Shoulder Internal Rotation) Level 2 (Red)     Shoulder Exercises: ROM/Strengthening   UBE (Upper Arm Bike) level 3.5, 3 min each way     Manual Therapy   Manual Therapy Soft tissue mobilization;Myofascial release   Manual therapy comments seated in chair without armrests    Soft tissue mobilization STM to R UT, LS, pecs, subscap & teres minor   Myofascial Release manual trigger point release to R UT and R lateral C-spine region   Passive ROM Manual R UT, Levator scap. stretch with overpressure into shoulder depression from therapist x 1 min eah                 PT Education - 04/25/17 1120    Education provided Yes   Education Details row and row/extension with green  TB issued to pt.   Person(s) Educated Patient   Methods Explanation;Demonstration;Verbal cues;Handout   Comprehension Verbalized understanding;Returned demonstration;Verbal cues required;Need further instruction             PT Long Term Goals - 04/11/17 1625      PT LONG TERM GOAL #1   Title Patient to be independent with advanced HEP (05/18/17)   Status On-going     PT LONG TERM GOAL #2   Title Patient to improve R shoulder AROM equal to that of L shoulder without pain limiting motion (05/18/17)   Status On-going     PT LONG TERM GOAL #3   Title Patient to demonstrate good postural awareness with ability ot self-correct facilitating improved biomechaincs at R [facility] joint (05/18/17)    Status On-going     PT LONG TERM GOAL #4   Title Patient to demonstrate ability to perform overhead activities as well as full work duties without pain limiting (05/18/17)   Status On-going               Plan - 04/25/17 1021    Clinical Impression Statement Pt. with visible rash over area of ionto patch applied in previous treatments thus ionto patch not reapplied today.  Pt. reporting less pain with reaching activities now however still with occasional catching pain with overhead motion.  Pt. performing IR/ER HEP without issue.  Today's treatment with some progression in RTC/scapular strengthening activity with pt. tolerating well pain free.  HEP updated to include scapular strengthening activities.  Pt. progressing with strengthening activities well at this point.    PT Treatment/Interventions ADLs/Self Care Home Management;Cryotherapy;Electrical Stimulation;Iontophoresis 4mg /ml Dexamethasone;Moist Heat;Ultrasound;Neuromuscular re-education;Therapeutic exercise;Therapeutic activities;Patient/family education;Manual techniques;Passive range of motion;Vasopneumatic Device;Taping;Dry needling   PT Next Visit Plan Monitor response to updated HEP; Manual work to decrease tone tightness in shoulder complex/cervical; Gentle ROM and strengthening, postural re-ed      Patient will benefit from skilled therapeutic intervention in order to improve the following deficits and impairments:  Pain, Impaired UE functional use, Postural dysfunction, Decreased activity tolerance, Decreased range of motion, Decreased strength, Increased muscle spasms  Visit Diagnosis: Acute pain of right shoulder  Abnormal posture  Muscle weakness (generalized)     Problem List Patient Active Problem List   Diagnosis Date Noted  . Right shoulder pain 03/03/2017  . Left elbow pain 11/09/2016  . Class 3 obesity due to excess calories without serious comorbidity with body mass index (BMI) of 40.0 to 44.9 in adult  ([place]) 11/02/2016  . GERD (gastroesophageal reflux disease) 07/10/2015  . Geographic tongue 04/28/2015  . Oral thrush 04/28/2015  . Sarcoidosis 04/09/2015  . Hyperglycemia 04/08/2015  . Hypokalemia 03/31/2015  . Constipation 03/19/2015  . Abdominal pain, epigastric 02/18/2015  . Hypothyroidism 02/18/2015  . HTN (hypertension) 02/18/2015  . Bilateral knee pain 05/12/2011    Bess Harvest, PTA 04/25/17 11:32 AM  [facility] [address]  [place], [place], [address] Phone: [phone]   Fax:  [phone]  Name: Elizabeth Riggs MRN: [id number] Date of Birth: 11/01/1971

## 2017-04-27 ENCOUNTER — Other Ambulatory Visit: Payer: Self-pay | Admitting: Family Medicine

## 2017-04-27 ENCOUNTER — Ambulatory Visit: Payer: BLUE CROSS/BLUE SHIELD

## 2017-04-27 DIAGNOSIS — R293 Abnormal posture: Secondary | ICD-10-CM

## 2017-04-27 DIAGNOSIS — M6281 Muscle weakness (generalized): Secondary | ICD-10-CM

## 2017-04-27 DIAGNOSIS — M25511 Pain in right shoulder: Secondary | ICD-10-CM | POA: Diagnosis not present

## 2017-04-27 NOTE — Therapy (Addendum)
[facility] [address]  [place], [place], [address] Phone: [phone]   Fax:  [phone]  Physical Therapy Treatment  Patient Details  Name: Elizabeth Riggs MRN: [id number] Date of Birth: [birth year] Referring Provider: Dr. Karlton Lemon  Encounter Date: 04/27/2017      PT End of Session - 04/27/17 0858    Visit Number 7   Number of Visits 12   Date for PT Re-Evaluation 05/18/17   PT Start Time 7408  pt. arrived late    PT Stop Time 0935   PT Time Calculation (min) 40 min   Activity Tolerance Patient tolerated treatment well   Behavior During Therapy [facility] for tasks assessed/performed      Past Medical History:  Diagnosis Date  . Chronic pain of both knees   . Gallstones   . Hypertension   . Obesity   . Sarcoidosis   . Thyroid disease     Past Surgical History:  Procedure Laterality Date  . ABDOMINAL HYSTERECTOMY    . CHOLECYSTECTOMY  03/12/15   [facility] in [place]  . KNEE SURGERY    . right knee surgery      There were no vitals filed for this visit.      Subjective Assessment - 04/27/17 0904    Subjective Pt. noting she feels stretches are helping however she admits to being inconsistent with other HEP activities.     Patient Stated Goals improve function and reduce pain   Currently in Pain? No/denies   Pain Score 0-No pain   Multiple Pain Sites No                         [facility] Adult PT Treatment/Exercise - 04/27/17 0907      Shoulder Exercises: Supine   Horizontal ABduction Both;AROM;Theraband;15 reps   Theraband Level (Shoulder Horizontal ABduction) Level 3 (Green)   Horizontal ABduction Limitations hooklying on 6" bolster   External Rotation Both;Theraband;15 reps   Theraband Level (Shoulder External Rotation) Level 2 (Red)   External Rotation Limitations hooklying on 6"  foam bolster     Shoulder Exercises: Standing   Horizontal ABduction  AROM;Both;Strengthening;10 reps   Theraband Level (Shoulder Horizontal ABduction) Level 1 (Yellow)   Horizontal ABduction Limitations cues for scap. squeeze and overall form    External Rotation Strengthening;Right;15 reps;Theraband   Theraband Level (Shoulder External Rotation) Level 2 (Red)   Internal Rotation Strengthening;Right;15 reps;Theraband   Theraband Level (Shoulder Internal Rotation) Level 2 (Red)     Shoulder Exercises: Therapy Ball   Other Therapy Ball Exercises flexion, scaption with orange (55cm) p-ball wall roll x 10 reps each way; mod cueing for technique and to avoid full shoulder activation     Manual Therapy   Manual Therapy Soft tissue mobilization   Manual therapy comments supine    Soft tissue mobilization STM to B UT to decrease tone; pt. noting she has been, "looser" in shoulders recently     Neck Exercises: Stretches   Upper Trapezius Stretch 2 reps;30 seconds   Upper Trapezius Stretch Limitations Heavy cueing for form    Levator Stretch 2 reps;30 seconds   Levator Stretch Limitations Heavy cueing for form    Other Neck Stretches Supine chest stretch laying on 6" bolster in cross position 2 x 60 sec                 PT Education - 04/27/17 1001  Education provided Yes   Education Details Levator scap. stretch, doorway chest stretch low/mid    Person(s) Educated Patient   Methods Explanation;Demonstration;Verbal cues;Handout   Comprehension Verbalized understanding;Returned demonstration;Verbal cues required;Need further instruction             PT Long Term Goals - 04/11/17 1625      PT LONG TERM GOAL #1   Title Patient to be independent with advanced HEP (05/18/17)   Status On-going     PT LONG TERM GOAL #2   Title Patient to improve R shoulder AROM equal to that of L shoulder without pain limiting motion (05/18/17)   Status On-going     PT LONG TERM GOAL #3   Title Patient to demonstrate good postural awareness with ability ot  self-correct facilitating improved biomechaincs at R [facility] joint (05/18/17)   Status On-going     PT LONG TERM GOAL #4   Title Patient to demonstrate ability to perform overhead activities as well as full work duties without pain limiting (05/18/17)   Status On-going               Plan - 04/27/17 0901    Clinical Impression Statement Pt. still finding relief from, "neck stretches" at home however admitting to inconsistent adherence to other HEP activities.  Discussion with pt. today on need for consistent adherence to strengthening HEP for full benefit from therapy and improved shoulder mechanics.  Continued postural re-education with chest stretching and scapular strengthening today which was tolerated well.  RTC strengthening continued with addition of overhead wall ball roll into flexion, scaption.  Pt. with only occasional sharp pains with overhead therex today responding well to progression.  Will plan to progress RTC/scapular strengthening per pt. tolerance in coming visits.     PT Treatment/Interventions ADLs/Self Care Home Management;Cryotherapy;Electrical Stimulation;Iontophoresis 4mg /ml Dexamethasone;Moist Heat;Ultrasound;Neuromuscular re-education;Therapeutic exercise;Therapeutic activities;Patient/family education;Manual techniques;Passive range of motion;Vasopneumatic Device;Taping;Dry needling   PT Next Visit Plan Monitor adherence to HEP; progress RTC strengthening per tolerance; Gentle ROM and strengthening, postural re-ed      Patient will benefit from skilled therapeutic intervention in order to improve the following deficits and impairments:  Pain, Impaired UE functional use, Postural dysfunction, Decreased activity tolerance, Decreased range of motion, Decreased strength, Increased muscle spasms  Visit Diagnosis: Acute pain of right shoulder  Abnormal posture  Muscle weakness (generalized)     Problem List Patient Active Problem List   Diagnosis Date Noted  .  Right shoulder pain 03/03/2017  . Left elbow pain 11/09/2016  . Class 3 obesity due to excess calories without serious comorbidity with body mass index (BMI) of 40.0 to 44.9 in adult ([place]) 11/02/2016  . GERD (gastroesophageal reflux disease) 07/10/2015  . Geographic tongue 04/28/2015  . Oral thrush 04/28/2015  . Sarcoidosis 04/09/2015  . Hyperglycemia 04/08/2015  . Hypokalemia 03/31/2015  . Constipation 03/19/2015  . Abdominal pain, epigastric 02/18/2015  . Hypothyroidism 02/18/2015  . HTN (hypertension) 02/18/2015  . Bilateral knee pain 05/12/2011    Bess Harvest, PTA 04/27/17 10:03 AM  [facility] [address]  [place], [place], [address] Phone: [phone]   Fax:  [phone]  Name: Elizabeth Riggs MRN: [id number] Date of Birth: 10-02-1971

## 2017-05-01 ENCOUNTER — Ambulatory Visit: Payer: BLUE CROSS/BLUE SHIELD

## 2017-05-04 ENCOUNTER — Ambulatory Visit: Payer: BLUE CROSS/BLUE SHIELD

## 2017-05-04 DIAGNOSIS — M25511 Pain in right shoulder: Secondary | ICD-10-CM | POA: Diagnosis not present

## 2017-05-04 DIAGNOSIS — M6281 Muscle weakness (generalized): Secondary | ICD-10-CM

## 2017-05-04 DIAGNOSIS — R293 Abnormal posture: Secondary | ICD-10-CM

## 2017-05-04 NOTE — Therapy (Signed)
[facility] [address]  [place], [place], [address] Phone: [phone]   Fax:  [phone]  Physical Therapy Treatment  Patient Details  Name: Elizabeth Riggs MRN: [id number] Date of Birth: 1971/09/20 Referring Provider: Dr. Karlton Lemon  Encounter Date: 05/04/2017      PT End of Session - 05/04/17 0920    Visit Number 8   Number of Visits 12   Date for PT Re-Evaluation 05/18/17   PT Start Time 0910   PT Stop Time 0957   PT Time Calculation (min) 47 min   Activity Tolerance Patient tolerated treatment well   Behavior During Therapy [facility] for tasks assessed/performed      Past Medical History:  Diagnosis Date  . Chronic pain of both knees   . Gallstones   . Hypertension   . Obesity   . Sarcoidosis   . Thyroid disease     Past Surgical History:  Procedure Laterality Date  . ABDOMINAL HYSTERECTOMY    . CHOLECYSTECTOMY  03/12/15   [facility] in [place]  . KNEE SURGERY    . right knee surgery      There were no vitals filed for this visit.      Subjective Assessment - 05/04/17 0913    Subjective Pt. reporting she did a lot of pulling yesterday with pt.'s handling and has had some numbness in R UE into hand, "on and off" since yesterday.     Patient Stated Goals improve function and reduce pain   Currently in Pain? No/denies   Pain Score 0-No pain   Multiple Pain Sites No                         [facility] Adult PT Treatment/Exercise - 05/04/17 0920      Shoulder Exercises: Supine   Horizontal ABduction Both;AROM;Theraband;20 reps   Theraband Level (Shoulder Horizontal ABduction) Level 3 (Green)   Horizontal ABduction Limitations hooklying on 6" bolster   External Rotation Both;Theraband;15 reps   Theraband Level (Shoulder External Rotation) Level 3 (Green)   External Rotation Limitations hooklying on 6"  foam bolster   Other Supine Exercises Alternating shoulder  flexion + opposite extension (X pattern) with green TB laying on 6" foam bolster x 10 reps each side    Other Supine Exercises --     Shoulder Exercises: Standing   Horizontal ABduction Both;Strengthening;10 reps   Theraband Level (Shoulder Horizontal ABduction) Level 2 (Red)   Horizontal ABduction Limitations cues for scap. squeeze and overall form    External Rotation Strengthening;Right;15 reps;Theraband   Theraband Level (Shoulder External Rotation) Level 3 (Green)   Internal Rotation Strengthening;Right;15 reps;Theraband   Theraband Level (Shoulder Internal Rotation) Level 3 (Green)   Other Standing Exercises Standing ER, IR with red TB in elevated 45 dg position x 10 reps each way; constant tactile cueing to maintain proper position     Shoulder Exercises: ROM/Strengthening   UBE (Upper Arm Bike) level 3.5, 3 min each way     Shoulder Exercises: Stretch   Corner Stretch 3 reps;30 seconds   Corner Stretch Limitations on doorframe      Manual Therapy   Manual Therapy Soft tissue mobilization   Manual therapy comments supine    Soft tissue mobilization STM and gentle overpressure into chest stretch supine on 6" bolster      Neck Exercises: Stretches   Other Neck Stretches Supine chest stretch laying on 6" bolster  in cross position 2 x 60 sec                 PT Education - 05/04/17 0927    Education provided Yes   Education Details with green TB issued to pt. for ER, IR   Person(s) Educated Patient   Methods Handout   Comprehension Verbal cues required             PT Long Term Goals - 04/11/17 1625      PT LONG TERM GOAL #1   Title Patient to be independent with advanced HEP (05/18/17)   Status On-going     PT LONG TERM GOAL #2   Title Patient to improve R shoulder AROM equal to that of L shoulder without pain limiting motion (05/18/17)   Status On-going     PT LONG TERM GOAL #3   Title Patient to demonstrate good postural awareness with ability ot  self-correct facilitating improved biomechaincs at R [facility] joint (05/18/17)   Status On-going     PT LONG TERM GOAL #4   Title Patient to demonstrate ability to perform overhead activities as well as full work duties without pain limiting (05/18/17)   Status On-going               Plan - 05/04/17 0924    Clinical Impression Statement Pt. performed well with all bellow shoulder height strengthening therex today.  45 dg elevated shoulder ER, IR with band resistance initiated today and tolerated well.  Some catching pain still with reaching to overhead counter shelves today.  All scapular and RTC strengthening activity progressed in treatment and well tolerated.  HEP updated with increased band resistance.  Will continue strengthening progression moving toward overhead activity per pt. tolerance in coming visits.     PT Treatment/Interventions ADLs/Self Care Home Management;Cryotherapy;Electrical Stimulation;Iontophoresis 4mg /ml Dexamethasone;Moist Heat;Ultrasound;Neuromuscular re-education;Therapeutic exercise;Therapeutic activities;Patient/family education;Manual techniques;Passive range of motion;Vasopneumatic Device;Taping;Dry needling   PT Next Visit Plan Monitor adherence to HEP; progress RTC strengthening moving toward overhead activity per tolerance; Gentle ROM and strengthening, postural re-ed      Patient will benefit from skilled therapeutic intervention in order to improve the following deficits and impairments:  Pain, Impaired UE functional use, Postural dysfunction, Decreased activity tolerance, Decreased range of motion, Decreased strength, Increased muscle spasms  Visit Diagnosis: Acute pain of right shoulder  Abnormal posture  Muscle weakness (generalized)     Problem List Patient Active Problem List   Diagnosis Date Noted  . Right shoulder pain 03/03/2017  . Left elbow pain 11/09/2016  . Class 3 obesity due to excess calories without serious comorbidity with body  mass index (BMI) of 40.0 to 44.9 in adult ([place]) 11/02/2016  . GERD (gastroesophageal reflux disease) 07/10/2015  . Geographic tongue 04/28/2015  . Oral thrush 04/28/2015  . Sarcoidosis 04/09/2015  . Hyperglycemia 04/08/2015  . Hypokalemia 03/31/2015  . Constipation 03/19/2015  . Abdominal pain, epigastric 02/18/2015  . Hypothyroidism 02/18/2015  . HTN (hypertension) 02/18/2015  . Bilateral knee pain 05/12/2011    Bess Harvest, PTA 05/04/17 10:11 AM  [facility] [address]  [place], [place], [address] Phone: [phone]   Fax:  [phone]  Name: Nicolle Heward MRN: [id number] Date of Birth: Dec 01, 1971

## 2017-05-09 ENCOUNTER — Ambulatory Visit: Payer: BLUE CROSS/BLUE SHIELD

## 2017-05-09 DIAGNOSIS — M6281 Muscle weakness (generalized): Secondary | ICD-10-CM

## 2017-05-09 DIAGNOSIS — M25511 Pain in right shoulder: Secondary | ICD-10-CM | POA: Diagnosis not present

## 2017-05-09 DIAGNOSIS — R293 Abnormal posture: Secondary | ICD-10-CM

## 2017-05-09 NOTE — Therapy (Signed)
[facility] [address]  [place], [place], [address] Phone: [phone]   Fax:  [phone]  Physical Therapy Treatment  Patient Details  Name: Elizabeth Riggs MRN: [id number] Date of Birth: [birth year] Referring Provider: Dr. Karlton Lemon  Encounter Date: 05/09/2017      PT End of Session - 05/09/17 0856    Visit Number 9   Number of Visits 12   Date for PT Re-Evaluation 05/18/17   PT Start Time 0850  pt. arrived late    PT Stop Time 0929   PT Time Calculation (min) 39 min   Activity Tolerance Patient tolerated treatment well   Behavior During Therapy [facility] for tasks assessed/performed      Past Medical History:  Diagnosis Date  . Chronic pain of both knees   . Gallstones   . Hypertension   . Obesity   . Sarcoidosis   . Thyroid disease     Past Surgical History:  Procedure Laterality Date  . ABDOMINAL HYSTERECTOMY    . CHOLECYSTECTOMY  03/12/15   [facility] in [place]  . KNEE SURGERY    . right knee surgery      There were no vitals filed for this visit.      Subjective Assessment - 05/09/17 0857    Subjective Pt. reporting she has not had R UE numbness since last visit.  Still having sharp pain with maximum height reaching and turning pt.'s in bed by herself.     Patient Stated Goals improve function and reduce pain   Currently in Pain? No/denies   Pain Score 0-No pain   Multiple Pain Sites No                         [facility] Adult PT Treatment/Exercise - 05/09/17 0900      Shoulder Exercises: Supine   Horizontal ABduction Both;Theraband;20 reps   Theraband Level (Shoulder Horizontal ABduction) Level 3 (Green)   Horizontal ABduction Limitations hooklying on 6" bolster   External Rotation Both;Theraband;15 reps  3" hold    Theraband Level (Shoulder External Rotation) Level 3 (Green)   External Rotation Limitations hooklying on 6"  foam bolster   Other  Supine Exercises Alternating shoulder flexion + opposite extension (X pattern) with green TB laying on 6" foam bolster x 10 reps each side      Shoulder Exercises: Standing   External Rotation Strengthening;Right;15 reps;Theraband  with at 90 dg abducton; constant tactile cueing required    Theraband Level (Shoulder External Rotation) Level 2 (Red)   Internal Rotation Strengthening;Right;15 reps;Theraband   Theraband Level (Shoulder Internal Rotation) Level 2 (Red)   Internal Rotation Limitations at 90 dg abduction; constant tactile cueing required   [employer];Theraband;10 reps   Row Limitations on TRX cable; cues for scapular squeeze     Shoulder Exercises: ROM/Strengthening   UBE (Upper Arm Bike) level 4.5, 3 min each way   Other ROM/Strengthening Exercises R shoulder flexion, scaption, abduction from counter top to 2nd shelf x 30 sec each way   pain free      Shoulder Exercises: Stretch   Corner Stretch 3 reps;30 seconds   Corner Stretch Limitations on doorframe      Neck Exercises: Stretches   Upper Trapezius Stretch 1 rep;30 seconds   Upper Trapezius Stretch Limitations R   Levator Stretch 1 rep;30 seconds   Levator Stretch Limitations R    Other Neck Stretches Supine chest  stretch laying on 6" bolster in cross position x 60 sec                      PT Long Term Goals - 04/11/17 1625      PT LONG TERM GOAL #1   Title Patient to be independent with advanced HEP (05/18/17)   Status On-going     PT LONG TERM GOAL #2   Title Patient to improve R shoulder AROM equal to that of L shoulder without pain limiting motion (05/18/17)   Status On-going     PT LONG TERM GOAL #3   Title Patient to demonstrate good postural awareness with ability ot self-correct facilitating improved biomechaincs at R [facility] joint (05/18/17)   Status On-going     PT LONG TERM GOAL #4   Title Patient to demonstrate ability to perform overhead activities as well as full work duties  without pain limiting (05/18/17)   Status On-going               Plan - 05/09/17 0856    Clinical Impression Statement Pt. doing well today.  Still having sharp pain with maximum height reaching and turning pt.'s in bed by herself at work.  R shoulder reaching from counter top to 2nd shelf into flexion, scaption, and abduction x 30 sec each way.  Pt. pain free with these movements today however quickly fatigued at R shoulder.  Elevated ER, and IR today with TB with pt. able to perform pain free.  Scapular strengthening and postural re-education activities progressed today and tolerated well.  Pt. noting good improvement in R shoulder pain levels since starting therapy.     PT Treatment/Interventions ADLs/Self Care Home Management;Cryotherapy;Electrical Stimulation;Iontophoresis 4mg /ml Dexamethasone;Moist Heat;Ultrasound;Neuromuscular re-education;Therapeutic exercise;Therapeutic activities;Patient/family education;Manual techniques;Passive range of motion;Vasopneumatic Device;Taping;Dry needling   PT Next Visit Plan 10th VISIT GOAL TESTING; Progress RTC strengthening moving toward overhead activity per tolerance; Gentle ROM and strengthening, postural re-ed      Patient will benefit from skilled therapeutic intervention in order to improve the following deficits and impairments:  Pain, Impaired UE functional use, Postural dysfunction, Decreased activity tolerance, Decreased range of motion, Decreased strength, Increased muscle spasms  Visit Diagnosis: Acute pain of right shoulder  Abnormal posture  Muscle weakness (generalized)     Problem List Patient Active Problem List   Diagnosis Date Noted  . Right shoulder pain 03/03/2017  . Left elbow pain 11/09/2016  . Class 3 obesity due to excess calories without serious comorbidity with body mass index (BMI) of 40.0 to 44.9 in adult ([place]) 11/02/2016  . GERD (gastroesophageal reflux disease) 07/10/2015  . Geographic tongue 04/28/2015   . Oral thrush 04/28/2015  . Sarcoidosis 04/09/2015  . Hyperglycemia 04/08/2015  . Hypokalemia 03/31/2015  . Constipation 03/19/2015  . Abdominal pain, epigastric 02/18/2015  . Hypothyroidism 02/18/2015  . HTN (hypertension) 02/18/2015  . Bilateral knee pain 05/12/2011    Bess Harvest, PTA 05/09/17 12:52 PM  [facility] [address]  [place], [place], [address] Phone: [phone]   Fax:  [phone]  Name: Eevee Borbon MRN: [id number] Date of Birth: 1971/06/14

## 2017-05-11 ENCOUNTER — Ambulatory Visit: Payer: BLUE CROSS/BLUE SHIELD | Admitting: Physical Therapy

## 2017-05-11 DIAGNOSIS — M6281 Muscle weakness (generalized): Secondary | ICD-10-CM

## 2017-05-11 DIAGNOSIS — M25511 Pain in right shoulder: Secondary | ICD-10-CM | POA: Diagnosis not present

## 2017-05-11 DIAGNOSIS — R293 Abnormal posture: Secondary | ICD-10-CM

## 2017-05-11 NOTE — Therapy (Signed)
[facility] [address]  [place], [place], [address] Phone: [phone]   Fax:  [phone]  Physical Therapy Treatment  Patient Details  Name: Elizabeth Riggs MRN: [id number] Date of Birth: 31-Oct-1971 Referring Provider: Dr. Karlton Lemon  Encounter Date: 05/11/2017      PT End of Session - 05/11/17 0853    Visit Number 10   Number of Visits 12   Date for PT Re-Evaluation 05/18/17   PT Start Time 0850   PT Stop Time 0931   PT Time Calculation (min) 41 min   Activity Tolerance Patient tolerated treatment well   Behavior During Therapy [facility] for tasks assessed/performed      Past Medical History:  Diagnosis Date  . Chronic pain of both knees   . Gallstones   . Hypertension   . Obesity   . Sarcoidosis   . Thyroid disease     Past Surgical History:  Procedure Laterality Date  . ABDOMINAL HYSTERECTOMY    . CHOLECYSTECTOMY  03/12/15   [facility] in [place]  . KNEE SURGERY    . right knee surgery      There were no vitals filed for this visit.      Subjective Assessment - 05/11/17 0852    Subjective Patient feeling much better since starting therapy. Only has pain with lifting.   Patient Stated Goals improve function and reduce pain   Currently in Pain? No/denies   Pain Score 0-No pain                         [facility] Adult PT Treatment/Exercise - 05/11/17 1135      Shoulder Exercises: Sidelying   External Rotation Strengthening;Right;15 reps;Weights   External Rotation Weight (lbs) 4   External Rotation Limitations heavy VC/TC for scap retraction     Shoulder Exercises: Standing   External Rotation Strengthening;Right;15 reps;Theraband   Theraband Level (Shoulder External Rotation) Level 3 (Green)   External Rotation Limitations eccentric   Other Standing Exercises Cabinet reaches - flexion and abduction x 15 each with 2#     Shoulder Exercises:  ROM/Strengthening   Cybex Row 15 reps   Cybex Row Limitations 20# - heavy focus on scap retraction   Ball on Wall circles with scap retraction - difficulty to combine isolated movements with tendency for shoulder hike   Other ROM/Strengthening Exercises sidelying - PNF scapular patterns to emphasize retraction wihtout UT involvement x 4 minutes    Other ROM/Strengthening Exercises NuStep (UE and LE) L5 x 6 minutes                     PT Long Term Goals - 05/11/17 0857      PT LONG TERM GOAL #1   Title Patient to be independent with advanced HEP (05/18/17)   Status On-going     PT LONG TERM GOAL #2   Title Patient to improve R shoulder AROM equal to that of L shoulder without pain limiting motion (05/18/17)   Status Partially Met     PT LONG TERM GOAL #3   Title Patient to demonstrate good postural awareness with ability ot self-correct facilitating improved biomechaincs at R [facility] joint (05/18/17)   Status On-going     PT LONG TERM GOAL #4   Title Patient to demonstrate ability to perform overhead activities as well as full work duties without pain limiting (05/18/17)   Status On-going  Plan - 05/11/17 0854    Clinical Impression Statement Lariza doing well with PT - feels like she has improved since beginning PT, however does still present with impingement type symptoms with pain reaching overhead, lifting, as well as "catching" pain when lowering arm from full flexion and abduction. Much time spent today on postural re-ed and periscapular strengthening as patient tends to heavily hike R shoulder with all movements (heavy upper trap involvement).    PT Treatment/Interventions ADLs/Self Care Home Management;Cryotherapy;Electrical Stimulation;Iontophoresis 56m/ml Dexamethasone;Moist Heat;Ultrasound;Neuromuscular re-education;Therapeutic exercise;Therapeutic activities;Patient/family education;Manual techniques;Passive range of motion;Vasopneumatic  Device;Taping;Dry needling   PT Next Visit Plan Progress RTC strengthening moving toward overhead activity per tolerance; Gentle ROM and strengthening, postural re-ed   Consulted and Agree with Plan of Care Patient      Patient will benefit from skilled therapeutic intervention in order to improve the following deficits and impairments:  Pain, Impaired UE functional use, Postural dysfunction, Decreased activity tolerance, Decreased range of motion, Decreased strength, Increased muscle spasms  Visit Diagnosis: Acute pain of right shoulder  Abnormal posture  Muscle weakness (generalized)     Problem List Patient Active Problem List   Diagnosis Date Noted  . Right shoulder pain 03/03/2017  . Left elbow pain 11/09/2016  . Class 3 obesity due to excess calories without serious comorbidity with body mass index (BMI) of 40.0 to 44.9 in adult ([place] 11/02/2016  . GERD (gastroesophageal reflux disease) 07/10/2015  . Geographic tongue 04/28/2015  . Oral thrush 04/28/2015  . Sarcoidosis 04/09/2015  . Hyperglycemia 04/08/2015  . Hypokalemia 03/31/2015  . Constipation 03/19/2015  . Abdominal pain, epigastric 02/18/2015  . Hypothyroidism 02/18/2015  . HTN (hypertension) 02/18/2015  . Bilateral knee pain 05/12/2011     SLanney Gins PT, DPT 05/11/17 11:42 AM   [facility] [place] [address]Phone: [phone]  Fax:  [phone] Name: Elizabeth LymanMRN: [id number] of Birth: [birth year]

## 2017-05-12 ENCOUNTER — Encounter: Payer: Self-pay | Admitting: Family Medicine

## 2017-05-12 ENCOUNTER — Ambulatory Visit (INDEPENDENT_AMBULATORY_CARE_PROVIDER_SITE_OTHER): Payer: BLUE CROSS/BLUE SHIELD | Admitting: Family Medicine

## 2017-05-12 DIAGNOSIS — M25511 Pain in right shoulder: Secondary | ICD-10-CM | POA: Diagnosis not present

## 2017-05-12 NOTE — Patient Instructions (Signed)
Continue with physical therapy until you've finished then do home exercises daily until pain resolves. Consider injection, MRI if you don't continue to improve. Follow up with me in 6 weeks (ok to cancel though if you're doing well).

## 2017-05-12 NOTE — Progress Notes (Signed)
PCP: Sinclair Ship, MD  Subjective:   HPI: Patient is a 46 y.o. female here for right shoulder pain.  3/7: Patient reports for about 2 months she's had worsening lateral right shoulder pain. No injury or trauma. Pain is a tightness, worse with overhead reaching at 7/10 level. Dong home exercises, using heating pad. No help with medications. No skin changes, numbness.  4/6: Patient returns with continued pain in right shoulder. States nitro patches caused a rash so stopped using these. Pain is 8/10 and sharp. Worse with any motion of this shoulder. Also with a large knot behind clavicle area in trapezius. Has been doing home exercises, icing and heating. Still at work, lifting patients. No skin changes, numbness.  5/18: Patient reports she's improved since last visit. Doing physical therapy and home exercises. Pain currently 0/10. Feels shoulder catches at about 90 degrees. Pain with this also. Feels worse when lowering arm. Takes aleve as needed. No skin changes, numbness.  Past Medical History:  Diagnosis Date  . Chronic pain of both knees   . Gallstones   . Hypertension   . Obesity   . Sarcoidosis   . Thyroid disease     Current Outpatient Prescriptions on File Prior to Visit  Medication Sig Dispense Refill  . diclofenac (VOLTAREN) 75 MG EC tablet Take 1 tablet (75 mg total) by mouth 2 (two) times daily. 60 tablet 1  . dicyclomine (BENTYL) 20 MG tablet Take 1 tablet (20 mg total) by mouth 2 (two) times daily. 20 tablet 0  . furosemide (LASIX) 20 MG tablet Take 1 tablet (20 mg total) by mouth daily. 30 tablet 0  . levothyroxine (SYNTHROID, LEVOTHROID) 125 MCG tablet Take 1 tablet (125 mcg total) by mouth daily. 30 tablet 0  . nitroGLYCERIN (NITRODUR - DOSED IN MG/24 HR) 0.2 mg/hr patch Apply 1/4th patch to affected area, change daily 30 patch 1  . nystatin (MYCOSTATIN) 100000 UNIT/ML suspension Swish, gargle, and spit 5 mL 3-4 times per day. 120 mL 0  . omeprazole  (PRILOSEC) 20 MG capsule Take 1 capsule (20 mg total) by mouth daily. 30 capsule 0  . ondansetron (ZOFRAN ODT) 4 MG disintegrating tablet Take 1 tablet (4 mg total) by mouth every 8 (eight) hours as needed for nausea or vomiting. 20 tablet 0  . oxyCODONE-acetaminophen (PERCOCET/ROXICET) 5-325 MG tablet Take 1 tablet by mouth every 6 (six) hours as needed for severe pain. 20 tablet 0  . pantoprazole (PROTONIX) 20 MG tablet Take 1 tablet (20 mg total) by mouth daily. 14 tablet 0  . potassium chloride SA (K-DUR,KLOR-CON) 20 MEQ tablet Take 40 mEq by mouth 2 (two) times daily.    Marland Kitchen spironolactone (ALDACTONE) 50 MG tablet Take 50 mg by mouth daily.     No current facility-administered medications on file prior to visit.     Past Surgical History:  Procedure Laterality Date  . ABDOMINAL HYSTERECTOMY    . CHOLECYSTECTOMY  03/12/15   [facility] in [place]  . KNEE SURGERY    . right knee surgery      Allergies  Allergen Reactions  . Morphine And Related Itching    Social History   Social History  . Marital status: Single    Spouse name: N/A  . Number of children: N/A  . Years of education: N/A   Occupational History  . Not on file.   Social History Main Topics  . Smoking status: Never Smoker  . Smokeless tobacco: Never Used  . Alcohol  use No  . Drug use: No  . Sexual activity: Not on file   Other Topics Concern  . Not on file   Social History Narrative   She works as a Quarry manager at [employer]   3 children- all live locally, she has 3 grand children.   49- son   10- son   23- son   Completed 12th grade   Enjoys church, spending time with grand children.       Family History  Problem Relation Age of Onset  . Hypertension Mother   . Diabetes Father   . Hypertension Father   . Cancer Maternal Grandmother        breast  . Cancer Maternal Grandfather        prostate  . Heart attack Neg Hx     BP 127/79   Pulse 86   Ht 5\' 5"   (1.651 m)   Wt 245 lb (111.1 kg)   BMI 40.77 kg/m   Review of Systems: See HPI above.     Objective:  Physical Exam:  Gen: NAD, comfortable in exam room  Right shoulder: No swelling, ecchymoses.  No gross deformity. No TTP. FROM with painful arc. Positive Hawkins, negative Neers. Negative Yergasons. Strength 5/5 with empty can and resisted internal/external rotation. Negative apprehension. NV intact distally.  Left shoulder: FROM without pain.   Assessment & Plan:  1. Right shoulder pain - 2/2 rotator cuff impingement.  Improving with physical therapy, home exercises.  She still has pain and impingement on exam however.  She does not want to pursue injection or MRI at this time.  Stopped nitro due to skin irritation/rash.  Aleve if needed.  F/u in 6 weeks or prn.

## 2017-05-13 ENCOUNTER — Other Ambulatory Visit: Payer: Self-pay | Admitting: Adult Health

## 2017-05-13 DIAGNOSIS — D869 Sarcoidosis, unspecified: Secondary | ICD-10-CM

## 2017-05-14 NOTE — Assessment & Plan Note (Signed)
2/2 rotator cuff impingement.  Improving with physical therapy, home exercises.  She still has pain and impingement on exam however.  She does not want to pursue injection or MRI at this time.  Stopped nitro due to skin irritation/rash.  Aleve if needed.  F/u in 6 weeks or prn.

## 2017-05-16 ENCOUNTER — Ambulatory Visit: Payer: BLUE CROSS/BLUE SHIELD | Admitting: Physical Therapy

## 2017-05-16 DIAGNOSIS — M6281 Muscle weakness (generalized): Secondary | ICD-10-CM

## 2017-05-16 DIAGNOSIS — M25511 Pain in right shoulder: Secondary | ICD-10-CM | POA: Diagnosis not present

## 2017-05-16 DIAGNOSIS — R293 Abnormal posture: Secondary | ICD-10-CM

## 2017-05-16 NOTE — Therapy (Addendum)
[facility] [address]  [place], [place], [address] Phone: [phone]   Fax:  [phone]  Physical Therapy Treatment  Patient Details  Name: Elizabeth Riggs MRN: [id number] Date of Birth: 12/06/1971 Referring Provider: Dr. Karlton Riggs  Encounter Date: 05/16/2017      PT End of Session - 05/16/17 0852    Visit Number 11   Number of Visits 12   Date for PT Re-Evaluation 05/18/17   PT Start Time 0850   PT Stop Time 0929   PT Time Calculation (min) 39 min   Activity Tolerance Patient tolerated treatment well   Behavior During Therapy [facility] for tasks assessed/performed      Past Medical History:  Diagnosis Date  . Chronic pain of both knees   . Gallstones   . Hypertension   . Obesity   . Sarcoidosis   . Thyroid disease     Past Surgical History:  Procedure Laterality Date  . ABDOMINAL HYSTERECTOMY    . CHOLECYSTECTOMY  03/12/15   [facility] in [place]  . KNEE SURGERY    . right knee surgery      There were no vitals filed for this visit.      Subjective Assessment - 05/16/17 0850    Subjective Saw MD last week - had some pain when pressing onto shoulder. Reports MD noticed shoulder hiking with active movements. May plan on MRI or other avenues at next follow-up with MD.    Patient Stated Goals improve function and reduce pain   Currently in Pain? No/denies   Pain Score 0-No pain                         [facility] Adult PT Treatment/Exercise - 05/16/17 0853      Shoulder Exercises: Sidelying   External Rotation Strengthening;Right;15 reps;Weights   External Rotation Weight (lbs) 3     Shoulder Exercises: Standing   External Rotation Strengthening;Right;15 reps;Theraband   Theraband Level (Shoulder External Rotation) Level 4 (Blue)   Internal Rotation Strengthening;Right;15 reps;Theraband   Theraband Level (Shoulder Internal Rotation) Level 4 (Blue)   Row  Strengthening;Both;15 reps;Theraband   Theraband Level (Shoulder Row) Level 4 (Blue)   Row Limitations 5 sec hold   Other Standing Exercises Cabinet reaches - flexion and abduction x 15 each with 3#   Other Standing Exercises wall push-ups x 15     Shoulder Exercises: ROM/Strengthening   UBE (Upper Arm Bike) Level 4 x 6 minutes (3/3)   Rhythmic Stabilization, Supine Yellow med ball: CW/CCW x 20; ABCs   Other ROM/Strengthening Exercises BATCA pull down - 25# x 15 reps     Shoulder Exercises: Body Blade   Flexion 15 seconds;2 reps   Flexion Limitations B UE   External Rotation 30 seconds;2 reps   External Rotation Limitations R UE                     PT Long Term Goals - 05/11/17 0857      PT LONG TERM GOAL #1   Title Patient to be independent with advanced HEP (05/18/17)   Status On-going     PT LONG TERM GOAL #2   Title Patient to improve R shoulder AROM equal to that of L shoulder without pain limiting motion (05/18/17)   Status Partially Met     PT LONG TERM GOAL #3   Title Patient to demonstrate good postural awareness  with ability ot self-correct facilitating improved biomechaincs at R [facility] joint (05/18/17)   Status On-going     PT LONG TERM GOAL #4   Title Patient to demonstrate ability to perform overhead activities as well as full work duties without pain limiting (05/18/17)   Status On-going               Plan - 05/16/17 0853    Clinical Impression Statement Patient well today. Saw MD last week - reports pain when MD applied pressure to top of shoulder, as well as MD noticing shoulder hiking with active movements, similar to what PT has been seeing. Patient able to isolate periscapular muscles much more today with active movement with decreased shoulder hiking while going into flexion and abduction. WIll plan to review comprehensive HEP at next visit to prepare patient for discharge vs 30 day hold.    PT Treatment/Interventions ADLs/Self Care Home  Management;Cryotherapy;Electrical Stimulation;Iontophoresis 46m/ml Dexamethasone;Moist Heat;Ultrasound;Neuromuscular re-education;Therapeutic exercise;Therapeutic activities;Patient/family education;Manual techniques;Passive range of motion;Vasopneumatic Device;Taping;Dry needling   PT Next Visit Plan discharge vs 30 day hold   Consulted and Agree with Plan of Care Patient      Patient will benefit from skilled therapeutic intervention in order to improve the following deficits and impairments:  Pain, Impaired UE functional use, Postural dysfunction, Decreased activity tolerance, Decreased range of motion, Decreased strength, Increased muscle spasms  Visit Diagnosis: Acute pain of right shoulder  Abnormal posture  Muscle weakness (generalized)     Problem List Patient Active Problem List   Diagnosis Date Noted  . Right shoulder pain 03/03/2017  . Left elbow pain 11/09/2016  . Class 3 obesity due to excess calories without serious comorbidity with body mass index (BMI) of 40.0 to 44.9 in adult ([place] 11/02/2016  . GERD (gastroesophageal reflux disease) 07/10/2015  . Geographic tongue 04/28/2015  . Oral thrush 04/28/2015  . Sarcoidosis 04/09/2015  . Hyperglycemia 04/08/2015  . Hypokalemia 03/31/2015  . Constipation 03/19/2015  . Abdominal pain, epigastric 02/18/2015  . Hypothyroidism 02/18/2015  . HTN (hypertension) 02/18/2015  . Bilateral knee pain 05/12/2011     SLanney Riggs PT, DPT 05/16/17 9:31 AM   PHYSICAL THERAPY DISCHARGE SUMMARY  Visits from Start of Care: 11  Current functional level related to goals / functional outcomes: See above   Remaining deficits: See above; likely met all goals - however, missed last scheduled appt   Education / Equipment: HEP  Plan: Patient agrees to discharge.  Patient goals were met. Patient is being discharged due to being pleased with the current functional level.  ?????     SLanney Riggs PT, DPT 06/19/17 1:50  PM    [facility] [place] [address]Phone: [phone]  Fax:  [phone] Name: CGrettell RansdellMRN: [id number] of Birth: [birth year]

## 2017-05-18 ENCOUNTER — Ambulatory Visit: Payer: BLUE CROSS/BLUE SHIELD | Admitting: Physical Therapy

## 2017-06-23 ENCOUNTER — Ambulatory Visit: Payer: BLUE CROSS/BLUE SHIELD | Admitting: Family Medicine

## 2017-07-04 DIAGNOSIS — I872 Venous insufficiency (chronic) (peripheral): Secondary | ICD-10-CM | POA: Insufficient documentation

## 2017-07-06 ENCOUNTER — Telehealth: Payer: Self-pay | Admitting: Family Medicine

## 2017-07-06 NOTE — Telephone Encounter (Signed)
Nitroglycerin is used for heart conditions but in patch form it is also used for tendinitis which is why she is using it - it improves blood flow and helps tendon issues heal quicker.

## 2017-07-06 NOTE — Telephone Encounter (Signed)
Patient calling with concerns regarding the nitroglycerin patches. States she was prescribed the patches for her shoulder pain but thought that nitroglycerin was used for heart issues.

## 2017-07-07 NOTE — Telephone Encounter (Signed)
Spoke to patient and gave her information provided by physician. 

## 2017-07-12 ENCOUNTER — Ambulatory Visit: Payer: BLUE CROSS/BLUE SHIELD | Admitting: Family Medicine

## 2017-07-27 ENCOUNTER — Emergency Department (HOSPITAL_BASED_OUTPATIENT_CLINIC_OR_DEPARTMENT_OTHER)
Admission: EM | Admit: 2017-07-27 | Discharge: 2017-07-28 | Disposition: A | Discharge status: Home / Self Care | Payer: BLUE CROSS/BLUE SHIELD | Attending: Emergency Medicine | Admitting: Emergency Medicine

## 2017-07-27 ENCOUNTER — Encounter (HOSPITAL_BASED_OUTPATIENT_CLINIC_OR_DEPARTMENT_OTHER): Payer: Self-pay

## 2017-07-27 DIAGNOSIS — K0889 Other specified disorders of teeth and supporting structures: Secondary | ICD-10-CM | POA: Diagnosis present

## 2017-07-27 DIAGNOSIS — E039 Hypothyroidism, unspecified: Secondary | ICD-10-CM | POA: Diagnosis not present

## 2017-07-27 DIAGNOSIS — I83893 Varicose veins of bilateral lower extremities with other complications: Secondary | ICD-10-CM | POA: Insufficient documentation

## 2017-07-27 DIAGNOSIS — I10 Essential (primary) hypertension: Secondary | ICD-10-CM | POA: Insufficient documentation

## 2017-07-27 DIAGNOSIS — Z79899 Other long term (current) drug therapy: Secondary | ICD-10-CM | POA: Diagnosis not present

## 2017-07-27 NOTE — ED Triage Notes (Signed)
C/o left upper toothache x 2 days-dentist appt next week-NAD-steady gait

## 2017-07-28 MED ORDER — OXYCODONE-ACETAMINOPHEN 5-325 MG PO TABS
1.0000 | ORAL_TABLET | Freq: Once | ORAL | Status: AC
  Administered 2017-07-28: 1 via ORAL
  Filled 2017-07-28: qty 1

## 2017-07-28 MED ORDER — AMOXICILLIN-POT CLAVULANATE 875-125 MG PO TABS
1.0000 | ORAL_TABLET | Freq: Once | ORAL | Status: AC
  Administered 2017-07-28: 1 via ORAL
  Filled 2017-07-28: qty 1

## 2017-07-28 MED ORDER — OXYCODONE-ACETAMINOPHEN 5-325 MG PO TABS: 1.0000 | ORAL_TABLET | Freq: Three times a day (TID) | ORAL | 0 refills | Status: DC | PRN

## 2017-07-28 MED ORDER — AMOXICILLIN-POT CLAVULANATE 875-125 MG PO TABS: 1.0000 | ORAL_TABLET | Freq: Two times a day (BID) | ORAL | 0 refills | Status: AC

## 2017-07-28 NOTE — ED Notes (Signed)
ED Provider at bedside. 

## 2017-07-28 NOTE — Discharge Instructions (Signed)
Please take Augmentin twice a day for the next 5 days. Please keep her dental appointment scheduled on Monday. If he develop fever, chills, facial swelling, or if the tooth starts to drain pus, please return to the emergency department for reevaluation.

## 2017-07-28 NOTE — ED Notes (Signed)
PA states the pt told her he son was in the lobby. Pt states her ride is in the lobby, no one found in lobby, or parking lot, pt informed that she would be unable to receive narcotic medication if ride is not present. Pt states she is not leaving until she gets her pain medication.and she will find a ride. Charge nurse made aware.

## 2017-07-28 NOTE — ED Notes (Signed)
Son at bedside , charge  nurse d/c pt

## 2017-07-28 NOTE — ED Provider Notes (Signed)
[place] DEPT [facility] Provider Note   CSN: [id number] Arrival date & time: 07/27/17  2101     History   Chief Complaint Chief Complaint  Patient presents with  . Dental Pain    HPI Elizabeth Riggs is a 46 y.o. female who presents to the emergency department with constant, worsening left upper dental pain 2 days. She denies facial swelling, trismus, dysphagia, fever, or chills. No drainage from the tooth. She states that she has a dentist appointment in 4 days. She reports that she has taken 8 Aleve today without relieve. No other complaints at this time.   The history is provided by the patient. No language interpreter was used.    Past Medical History:  Diagnosis Date  . Chronic pain of both knees   . Gallstones   . Hypertension   . Obesity   . Sarcoidosis   . Thyroid disease     Patient Active Problem List   Diagnosis Date Noted  . Right shoulder pain 03/03/2017  . Left elbow pain 11/09/2016  . Class 3 obesity due to excess calories without serious comorbidity with body mass index (BMI) of 40.0 to 44.9 in adult ([place]) 11/02/2016  . GERD (gastroesophageal reflux disease) 07/10/2015  . Geographic tongue 04/28/2015  . Oral thrush 04/28/2015  . Sarcoidosis 04/09/2015  . Hyperglycemia 04/08/2015  . Hypokalemia 03/31/2015  . Constipation 03/19/2015  . Abdominal pain, epigastric 02/18/2015  . Hypothyroidism 02/18/2015  . HTN (hypertension) 02/18/2015  . Bilateral knee pain 05/12/2011    Past Surgical History:  Procedure Laterality Date  . ABDOMINAL HYSTERECTOMY    . CHOLECYSTECTOMY  03/12/15   [facility] in [place]  . KNEE SURGERY    . right knee surgery      OB History    No data available       Home Medications    Prior to Admission medications   Medication Sig Start Date End Date Taking? Authorizing Provider  amoxicillin-clavulanate (AUGMENTIN) 875-125 MG tablet Take 1 tablet by mouth every 12 (twelve) hours. 07/28/17 08/02/17  Johnjoseph Rolfe,  Corky Blumstein A, PA-C  diclofenac (VOLTAREN) 75 MG EC tablet Take 1 tablet (75 mg total) by mouth 2 (two) times daily. 12/07/16   Hudnall, Sharyn Lull, MD  dicyclomine (BENTYL) 20 MG tablet Take 1 tablet (20 mg total) by mouth 2 (two) times daily. 03/09/17   Malvin Johns, MD  furosemide (LASIX) 20 MG tablet Take 1 tablet (20 mg total) by mouth daily. 09/13/16   Rigoberto Noel, MD  levothyroxine (SYNTHROID, LEVOTHROID) 125 MCG tablet Take 1 tablet (125 mcg total) by mouth daily. 03/23/17   Parrett, Fonnie Mu, NP  nitroGLYCERIN (NITRODUR - DOSED IN MG/24 HR) 0.2 mg/hr patch Apply 1/4th patch to affected area, change daily 03/01/17   Hudnall, Sharyn Lull, MD  nystatin (MYCOSTATIN) 100000 UNIT/ML suspension Swish, gargle, and spit 5 mL 3-4 times per day. 12/25/16   Tanna Furry, MD  omeprazole (PRILOSEC) 20 MG capsule Take 1 capsule (20 mg total) by mouth daily. 03/09/17   Malvin Johns, MD  ondansetron (ZOFRAN ODT) 4 MG disintegrating tablet Take 1 tablet (4 mg total) by mouth every 8 (eight) hours as needed for nausea or vomiting. 09/27/16   [name], PA-C  oxyCODONE-acetaminophen (PERCOCET/ROXICET) 5-325 MG tablet Take 1 tablet by mouth every 8 (eight) hours as needed for severe pain. 07/28/17   Raedyn Wenke A, PA-C  pantoprazole (PROTONIX) 20 MG tablet Take 1 tablet (20 mg total) by mouth daily. 09/27/16  [name], PA-C  potassium chloride SA (K-DUR,KLOR-CON) 20 MEQ tablet Take 40 mEq by mouth 2 (two) times daily.    [provider]  spironolactone (ALDACTONE) 50 MG tablet Take 50 mg by mouth daily.    [provider]    Family History Family History  Problem Relation Age of Onset  . Hypertension Mother   . Diabetes Father   . Hypertension Father   . Cancer Maternal Grandmother        breast  . Cancer Maternal Grandfather        prostate  . Heart attack Neg Hx     Social History Social History  Substance Use Topics  . Smoking status: Never Smoker  . Smokeless tobacco: Never Used  .  Alcohol use No     Allergies   Morphine and related   Review of Systems Review of Systems  Constitutional: Negative for chills and fever.  HENT: Positive for dental problem. Negative for facial swelling and trouble swallowing.   Allergic/Immunologic: Negative for immunocompromised state.     Physical Exam Updated Vital Signs BP 138/86 (BP Location: Left Arm)   Pulse 86   Temp 98.4 F (36.9 C) (Oral)   Resp 18   Ht 5\' 5"  (1.651 m)   Wt 121.6 kg (268 lb)   SpO2 99%   BMI 44.60 kg/m   Physical Exam  Constitutional: No distress.  HENT:  Head: Normocephalic.  Mouth/Throat: Uvula is midline, oropharynx is clear and moist and mucous membranes are normal. No oral lesions. No trismus in the jaw. Abnormal dentition. Dental caries present. No dental abscesses or uvula swelling.    Tooth 11 is missing. Numerous dental caries and periodontal disease. No gross abscess. No trismus.  Eyes: Conjunctivae are normal.  Neck: Neck supple.  Cardiovascular: Normal rate and regular rhythm.  Exam reveals no gallop and no friction rub.   No murmur heard. Pulmonary/Chest: Effort normal. No respiratory distress.  Abdominal: Soft. She exhibits no distension.  Neurological: She is alert.  Skin: Skin is warm. No rash noted.  Psychiatric: Her behavior is normal.  Nursing note and vitals reviewed.    ED Treatments / Results  Labs (all labs ordered are listed, but only abnormal results are displayed) Labs Reviewed - No data to display  EKG  EKG Interpretation None       Radiology No results found.  Procedures Procedures (including critical care time)  Medications Ordered in ED Medications  amoxicillin-clavulanate (AUGMENTIN) 875-125 MG per tablet 1 tablet (not administered)  oxyCODONE-acetaminophen (PERCOCET/ROXICET) 5-325 MG per tablet 1 tablet (not administered)     Initial Impression / Assessment and Plan / ED Course  I have reviewed the triage vital signs and the nursing  notes.  Pertinent labs & imaging results that were available during my care of the patient were reviewed by me and considered in my medical decision making (see chart for details).    Patient with toothache.  No gross abscess.  Exam unconcerning for Ludwig's angina or spread of infection.  Will treat with Augmentin and pain medicine.  Urged patient to follow-up with dentist.     Final Clinical Impressions(s) / ED Diagnoses   Final diagnoses:  Pain, dental    New Prescriptions New Prescriptions   AMOXICILLIN-CLAVULANATE (AUGMENTIN) 875-125 MG TABLET    Take 1 tablet by mouth every 12 (twelve) hours.   OXYCODONE-ACETAMINOPHEN (PERCOCET/ROXICET) 5-325 MG TABLET    Take 1 tablet by mouth every 8 (eight) hours as needed for  severe pain.     Joline Maxcy A, PA-C 07/28/17 0027    Shanon Rosser, MD 07/28/17 8721

## 2017-09-05 ENCOUNTER — Encounter: Payer: Self-pay | Admitting: Family Medicine

## 2017-09-05 ENCOUNTER — Ambulatory Visit (INDEPENDENT_AMBULATORY_CARE_PROVIDER_SITE_OTHER): Payer: BLUE CROSS/BLUE SHIELD | Admitting: Family Medicine

## 2017-09-05 DIAGNOSIS — M25561 Pain in right knee: Secondary | ICD-10-CM

## 2017-09-05 DIAGNOSIS — M25571 Pain in right ankle and joints of right foot: Secondary | ICD-10-CM | POA: Diagnosis not present

## 2017-09-05 DIAGNOSIS — M25572 Pain in left ankle and joints of left foot: Secondary | ICD-10-CM

## 2017-09-05 DIAGNOSIS — G8929 Other chronic pain: Secondary | ICD-10-CM | POA: Diagnosis not present

## 2017-09-05 DIAGNOSIS — M25522 Pain in left elbow: Secondary | ICD-10-CM | POA: Diagnosis not present

## 2017-09-05 DIAGNOSIS — M25562 Pain in left knee: Secondary | ICD-10-CM | POA: Diagnosis not present

## 2017-09-05 MED ORDER — NAPROXEN 500 MG PO TABS: 500.0000 mg | ORAL_TABLET | Freq: Two times a day (BID) | ORAL | 2 refills | Status: DC | PRN

## 2017-09-05 NOTE — Patient Instructions (Signed)
Your knee and ankle pain is due to arthritis. These are the different medications you can take for this: Tylenol 500mg  1-2 tabs three times a day for pain. Capsaicin, aspercreme, or biofreeze topically up to four times a day may also help with pain. Some supplements that may help for arthritis: Boswellia extract, curcumin, pycnogenol Naproxen 500mg  twice a day with food for pain and inflammation. Cortisone injections are an option. If cortisone injections do not help, there are different types of shots that may help but they take longer to take effect. It's important that you continue to stay active. Straight leg raises, knee extensions 3 sets of 10 once a day (add ankle weight if these become too easy). Consider physical therapy to strengthen muscles around the joint that hurts to take pressure off of the joint itself. Shoe inserts with good arch support may be helpful. Heat or ice 15 minutes at a time 3-4 times a day as needed to help with pain. Water aerobics and cycling with low resistance are the best two types of exercise for arthritis though any exercise is ok as long as it doesn't worsen the pain. Follow up with me in 6 weeks.  You have plantar fasciitis of your right foot. Take tylenol and/or aleve as needed for pain  Plantar fascia stretch for 20-30 seconds (do 3 of these) in morning Lowering/raise on a step exercises 3 x 10 once or twice a day - this is very important for long term recovery. Ice heel for 15 minutes as needed. Avoid flat shoes/barefoot walking as much as possible. Arch straps have been shown to help with pain. Inserts are important (dr. Zoe Lan active series, spencos, our green insoles, custom orthotics). Steroid injection is a consideration for short term pain relief if you are struggling. Physical therapy is also an option.  Your left elbow pain is due to biceps tendinitis. Naproxen as noted above.   Arm curls, hammer rotation exercises 3 sets of 10 once a  day with light weight. Consider physical therapy for this also.

## 2017-09-06 DIAGNOSIS — M25571 Pain in right ankle and joints of right foot: Secondary | ICD-10-CM | POA: Insufficient documentation

## 2017-09-06 DIAGNOSIS — M25572 Pain in left ankle and joints of left foot: Secondary | ICD-10-CM

## 2017-09-06 NOTE — Progress Notes (Signed)
PCP: Sinclair Ship, MD  Subjective:   HPI: Patient is a 46 y.o. female here for multiple joint complaints.  Patient reports she's had increased problems in both knees for past couple months. Pain up to 9/10 and sharp. Feels like right knee locks up at times. She is taking tylenol and ibuprofen 600mg  once a day. Pain is medial and anterior. She also has pain in left elbow anteriorly worse with lifting, can get up to 10/10 and sharp. Finally reports anterior bilateral ankle pain worse with walking. Worse when on feet a lot at work too. No skin changes, numbness.  Past Medical History:  Diagnosis Date  . Chronic pain of both knees   . Gallstones   . Hypertension   . Obesity   . Sarcoidosis   . Thyroid disease     Current Outpatient Prescriptions on File Prior to Visit  Medication Sig Dispense Refill  . dicyclomine (BENTYL) 20 MG tablet Take 1 tablet (20 mg total) by mouth 2 (two) times daily. 20 tablet 0  . furosemide (LASIX) 20 MG tablet Take 1 tablet (20 mg total) by mouth daily. 30 tablet 0  . levothyroxine (SYNTHROID, LEVOTHROID) 125 MCG tablet Take 1 tablet (125 mcg total) by mouth daily. 30 tablet 0  . nitroGLYCERIN (NITRODUR - DOSED IN MG/24 HR) 0.2 mg/hr patch Apply 1/4th patch to affected area, change daily 30 patch 1  . nystatin (MYCOSTATIN) 100000 UNIT/ML suspension Swish, gargle, and spit 5 mL 3-4 times per day. 120 mL 0  . omeprazole (PRILOSEC) 20 MG capsule Take 1 capsule (20 mg total) by mouth daily. 30 capsule 0  . ondansetron (ZOFRAN ODT) 4 MG disintegrating tablet Take 1 tablet (4 mg total) by mouth every 8 (eight) hours as needed for nausea or vomiting. 20 tablet 0  . oxyCODONE-acetaminophen (PERCOCET/ROXICET) 5-325 MG tablet Take 1 tablet by mouth every 8 (eight) hours as needed for severe pain. 8 tablet 0  . pantoprazole (PROTONIX) 20 MG tablet Take 1 tablet (20 mg total) by mouth daily. 14 tablet 0  . potassium chloride SA (K-DUR,KLOR-CON) 20 MEQ tablet Take 40  mEq by mouth 2 (two) times daily.    Marland Kitchen spironolactone (ALDACTONE) 50 MG tablet Take 50 mg by mouth daily.     No current facility-administered medications on file prior to visit.     Past Surgical History:  Procedure Laterality Date  . ABDOMINAL HYSTERECTOMY    . CHOLECYSTECTOMY  03/12/15   [facility] in [place]  . KNEE SURGERY    . right knee surgery      Allergies  Allergen Reactions  . Morphine And Related Itching  . Lisinopril Other (See Comments) and Rash    headache    Social History   Social History  . Marital status: Single    Spouse name: N/A  . Number of children: N/A  . Years of education: N/A   Occupational History  . Not on file.   Social History Main Topics  . Smoking status: Never Smoker  . Smokeless tobacco: Never Used  . Alcohol use No  . Drug use: No  . Sexual activity: Not on file   Other Topics Concern  . Not on file   Social History Narrative   She works as a Quarry manager at [employer]   3 children- all live locally, she has 3 grand children.   74- son   54- son   48- son   Completed 12th grade  Enjoys church, spending time with grand children.       Family History  Problem Relation Age of Onset  . Hypertension Mother   . Diabetes Father   . Hypertension Father   . Cancer Maternal Grandmother        breast  . Cancer Maternal Grandfather        prostate  . Heart attack Neg Hx     BP (!) 152/89   Pulse 79   Ht 5\' 5"  (1.651 m)   Wt 235 lb (106.6 kg)   BMI 39.11 kg/m   Review of Systems: See HPI above.     Objective:  Physical Exam:  Gen: NAD, comfortable in exam room  Bilateral knees: Genu valgum.  No other gross deformity, ecchymoses, effusion. TTP medial joint lines and post patellar facets. FROM. Negative ant/post drawers. Negative valgus/varus testing. Negative lachmanns. Negative mcmurrays, apleys, patellar apprehension. NV intact distally.  Bilateral ankles: Diffuse  swelling of lower legs and ankles.  No other gross deformity, ecchymoses FROM with 5/5 strength all directions. TTP anterior ankle joint and right plantar fascia proximally.  No bony tenderness. Negative ant drawer and talar tilt.   Negative syndesmotic compression. Thompsons test negative. NV intact distally.  Left elbow: No gross deformity, swelling, bruising. FROM with pain on flexion. No pain on resisted wrist extension, 3rd digit extension, pronation/supination. Collateral ligaments intact. NVI distally.   Assessment & Plan:  1. Bilateral knee pain - 2/2 arthritis confirmed by prior radiographs.  Discussed tylenol, topical medications, supplements that may help.  Rx naproxen given today.  Consider repeat injections.  Shown home exercises to do daily.  Arch supports.  Ice/heat.  F/u in 6 weeks.  2. Bilateral ankle pain - 2/2 arthritis and right plantar fasciitis.  Medications, treatment options noted above.  Shown home exercises and stretches to do for plantar fasciitis.  Arch binders.  Arch supports.  Consider physical therapy.  3.  Left elbow pain - 2/2 biceps tendinitis.  Naproxen.  Shown home exercises to do daily.  Consider physical therapy.

## 2017-09-06 NOTE — Assessment & Plan Note (Signed)
2/2 biceps tendinitis.  Naproxen.  Shown home exercises to do daily.  Consider physical therapy.

## 2017-09-06 NOTE — Assessment & Plan Note (Signed)
2/2 arthritis confirmed by prior radiographs.  Discussed tylenol, topical medications, supplements that may help.  Rx naproxen given today.  Consider repeat injections.  Shown home exercises to do daily.  Arch supports.  Ice/heat.  F/u in 6 weeks.

## 2017-09-06 NOTE — Assessment & Plan Note (Signed)
2/2 arthritis and right plantar fasciitis.  Medications, treatment options noted above.  Shown home exercises and stretches to do for plantar fasciitis.  Arch binders.  Arch supports.  Consider physical therapy.

## 2017-09-28 ENCOUNTER — Ambulatory Visit: Payer: BLUE CROSS/BLUE SHIELD | Admitting: Pulmonary Disease

## 2017-10-17 ENCOUNTER — Ambulatory Visit: Payer: BLUE CROSS/BLUE SHIELD | Admitting: Family Medicine

## 2017-11-01 DIAGNOSIS — I83893 Varicose veins of bilateral lower extremities with other complications: Secondary | ICD-10-CM | POA: Diagnosis not present

## 2017-11-01 DIAGNOSIS — I872 Venous insufficiency (chronic) (peripheral): Secondary | ICD-10-CM | POA: Diagnosis not present

## 2017-11-02 ENCOUNTER — Ambulatory Visit: Payer: BLUE CROSS/BLUE SHIELD | Admitting: Pulmonary Disease

## 2017-11-09 ENCOUNTER — Ambulatory Visit: Payer: BLUE CROSS/BLUE SHIELD | Admitting: Adult Health

## 2017-11-23 ENCOUNTER — Ambulatory Visit (INDEPENDENT_AMBULATORY_CARE_PROVIDER_SITE_OTHER): Payer: BLUE CROSS/BLUE SHIELD | Admitting: Adult Health

## 2017-11-23 ENCOUNTER — Ambulatory Visit (HOSPITAL_BASED_OUTPATIENT_CLINIC_OR_DEPARTMENT_OTHER)
Admission: RE | Admit: 2017-11-23 | Discharge: 2017-11-23 | Disposition: A | Discharge status: Home / Self Care | Payer: BLUE CROSS/BLUE SHIELD | Source: Ambulatory Visit | Attending: Adult Health | Admitting: Adult Health

## 2017-11-23 ENCOUNTER — Encounter: Payer: Self-pay | Admitting: Adult Health

## 2017-11-23 ENCOUNTER — Telehealth: Payer: Self-pay | Admitting: Family

## 2017-11-23 VITALS — BP 150/89 | HR 77 | Ht 65.0 in | Wt 273.0 lb

## 2017-11-23 DIAGNOSIS — D869 Sarcoidosis, unspecified: Secondary | ICD-10-CM | POA: Insufficient documentation

## 2017-11-23 DIAGNOSIS — R918 Other nonspecific abnormal finding of lung field: Secondary | ICD-10-CM | POA: Insufficient documentation

## 2017-11-23 NOTE — Assessment & Plan Note (Signed)
Stable . Spirometry with minimal decrease .  Check PFT w/ [facility] on return  Check cxr today   Plan  Patient Instructions  Continue on current regimen  Low salt diet , work on weight loss.  Chest xray today  Follow up with Dr. Elsworth Soho n 6 months and As needed  [facility] office .

## 2017-11-23 NOTE — Telephone Encounter (Signed)
Please print out patient medical records for the 2017 year with Melissa. Patient will come back and pick up info. CB: [phone]

## 2017-11-23 NOTE — Assessment & Plan Note (Signed)
Wt loss  

## 2017-11-23 NOTE — Addendum Note (Signed)
Addended by: Valerie Salts on: 11/23/2017 11:21 AM   Modules accepted: Orders

## 2017-11-23 NOTE — Patient Instructions (Addendum)
Continue on current regimen  Low salt diet , work on weight loss.  Chest xray today  Follow up with Dr. Elsworth Soho n 6 months and As needed  [facility] office . W/ PFT

## 2017-11-23 NOTE — Progress Notes (Signed)
@Patient  ID: Elizabeth Riggs, female    DOB: 1971/02/19, 46 y.o.   MRN: [id number]  Chief Complaint  Patient presents with  . 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  She presented in 02/2015 with weight loss, intractable nausea and vomiting Had lap chole 03/12/15 and was told that she had a growth on her GB  Pathology showed non-caseating granulomas.   Significant tests/ events  Chest x-ray 12/2014 showed right hilar prominence which was new compared to 05/2014 CT chest 03/2015 - mediastinal lymphadenopathy-subcarinal and precarinal, scattered subcentimeter nodules CT abdomen from 2010 -clear bases PFTs 04/2015 -nml  Echo 08/2015 showed nml EF,Mild LVH c/w HTN   11/23/2017 Follow up : Sarcoid  Pt returns for follow up for Sarcoid . Says overall she is doing okay overall. No increased cough . Denies rash. She followed with eye doctor yearly. Does get winded with heavy activity .  Spirometry today shows slightly decreased to stable lung function with FVC at 70%, FEV1 71%, ratio 83.  Denies chest pain, orthopnea or increased edema.  She remains on lasix 20mg  daily . No orthopnea.      Allergies  Allergen Reactions  . Morphine And Related Itching  . Lisinopril Other (See Comments) and Rash    headache    Immunization History  Administered Date(s) Administered  . Influenza Split 12/23/2016    Past Medical History:  Diagnosis Date  . Chronic pain of both knees   . Gallstones   . Hypertension   . Obesity   . Sarcoidosis   . Thyroid disease     Tobacco History: Social History   Tobacco Use  Smoking Status Never Smoker  Smokeless Tobacco Never Used   Counseling given: Not Answered   Outpatient Encounter Medications as of 11/23/2017  Medication Sig  . dicyclomine (BENTYL) 20 MG tablet Take 1 tablet (20 mg total) by mouth 2 (two) times daily.  . furosemide (LASIX) 20 MG tablet  Take 1 tablet (20 mg total) by mouth daily.  Marland Kitchen levothyroxine (SYNTHROID, LEVOTHROID) 125 MCG tablet Take 1 tablet (125 mcg total) by mouth daily.  . naproxen (NAPROSYN) 500 MG tablet Take 1 tablet (500 mg total) by mouth 2 (two) times daily as needed.  . nitroGLYCERIN (NITRODUR - DOSED IN MG/24 HR) 0.2 mg/hr patch Apply 1/4th patch to affected area, change daily  . nystatin (MYCOSTATIN) 100000 UNIT/ML suspension Swish, gargle, and spit 5 mL 3-4 times per day.  Marland Kitchen omeprazole (PRILOSEC) 20 MG capsule Take 1 capsule (20 mg total) by mouth daily.  . ondansetron (ZOFRAN ODT) 4 MG disintegrating tablet Take 1 tablet (4 mg total) by mouth every 8 (eight) hours as needed for nausea or vomiting.  . pantoprazole (PROTONIX) 20 MG tablet Take 1 tablet (20 mg total) by mouth daily.  . potassium chloride SA (K-DUR,KLOR-CON) 20 MEQ tablet Take 40 mEq by mouth 2 (two) times daily.  Marland Kitchen spironolactone (ALDACTONE) 50 MG tablet Take 50 mg by mouth daily.  . [DISCONTINUED] oxyCODONE-acetaminophen (PERCOCET/ROXICET) 5-325 MG tablet Take 1 tablet by mouth every 8 (eight) hours as needed for severe pain.   No facility-administered encounter medications on file as of 11/23/2017.      Review of Systems  Constitutional:   No  weight loss, night sweats,  Fevers, chills, + fatigue, or  lassitude.  HEENT:   No headaches,  Difficulty swallowing,  Tooth/dental problems, or  Sore throat,                No sneezing, itching, ear ache, nasal congestion, post nasal drip,   CV:  No chest pain,  Orthopnea, PND, swelling in lower extremities, anasarca, dizziness, palpitations, syncope.   GI  No heartburn, indigestion, abdominal pain, nausea, vomiting, diarrhea, change in bowel habits, loss of appetite, bloody stools.   Resp:   No excess mucus, no productive cough,  No non-productive cough,  No coughing up of blood.  No change in color of mucus.  No wheezing.  No chest wall deformity  Skin: no rash or lesions.  GU: no  dysuria, change in color of urine, no urgency or frequency.  No flank pain, no hematuria   MS:  No joint pain or swelling.  No decreased range of motion.  No back pain.    Physical Exam  BP (!) 150/89 (BP Location: Left Arm, Patient Position: Sitting, Cuff Size: Large)   Pulse 77   Ht 5\' 5"  (1.651 m)   Wt 273 lb (123.8 kg)   SpO2 97%   BMI 45.43 kg/m   GEN: A/Ox3; pleasant , NAD obese    HEENT:  [place]/AT,  EACs-clear, TMs-wnl, NOSE-clear, THROAT-clear, no lesions, no postnasal drip or exudate noted.   NECK:  Supple w/ fair ROM; no JVD; normal carotid impulses w/o bruits; no thyromegaly or nodules palpated; no lymphadenopathy.    RESP  Clear  P & A; w/o, wheezes/ rales/ or rhonchi. no accessory muscle use, no dullness to percussion  CARD:  RRR, no m/r/g, tr  peripheral edema, pulses intact, no cyanosis or clubbing.  GI:   Soft & nt; nml bowel sounds; no organomegaly or masses detected.   Musco: Warm bil, no deformities or joint swelling noted.   Neuro: alert, no focal deficits noted.    Skin: Warm, no lesions or rashes    Lab Results:   BNP No results found for: BNP  ProBNP No results found for: PROBNP  Imaging: No results found.   Assessment & Plan:   Sarcoidosis Stable . Spirometry with minimal decrease .  Check PFT w/ [facility]CO on return  Check cxr today   Plan  Patient Instructions  Continue on current regimen  Low salt diet , work on weight loss.  Chest xray today  Follow up with Dr. Elsworth Soho n 6 months and As needed  [facility] office .     Class 3 obesity due to excess calories without serious comorbidity with body mass index (BMI) of 40.0 to 44.9 in adult [facility]) Wt loss      Rexene Edison, NP 11/23/2017

## 2017-11-24 NOTE — Telephone Encounter (Signed)
This patient did not have office visits with [place] in 2017. She will need to sign a records release when she comes in and it will be forwarded to the Medical Records Dept to release to the doctor / practice of her choice.

## 2017-11-24 NOTE — Progress Notes (Signed)
Reviewed, agree 

## 2017-11-28 DIAGNOSIS — R600 Localized edema: Secondary | ICD-10-CM | POA: Diagnosis not present

## 2017-11-28 DIAGNOSIS — I10 Essential (primary) hypertension: Secondary | ICD-10-CM | POA: Diagnosis not present

## 2017-11-28 DIAGNOSIS — E039 Hypothyroidism, unspecified: Secondary | ICD-10-CM | POA: Diagnosis not present

## 2017-11-28 DIAGNOSIS — Z6841 Body Mass Index (BMI) 40.0 and over, adult: Secondary | ICD-10-CM | POA: Diagnosis not present

## 2017-12-20 DIAGNOSIS — I872 Venous insufficiency (chronic) (peripheral): Secondary | ICD-10-CM | POA: Diagnosis not present

## 2018-01-04 ENCOUNTER — Telehealth: Payer: Self-pay | Admitting: Pulmonary Disease

## 2018-01-04 MED ORDER — PREDNISONE 10 MG PO TABS: ORAL_TABLET | ORAL | 0 refills | Status: DC

## 2018-01-04 NOTE — Telephone Encounter (Signed)
Pt is aware of below recommendations and voiced her understanding.  Rx for prednisone has been sent to preferred pharmacy. Pt has been scheduled for 37mo rov with TP for 02/05/18 per CXR results.  Nothing further is needed.

## 2018-01-04 NOTE — Telephone Encounter (Signed)
Prednisone 10 mg, # 20, 4 X 2 DAYS, 3 X 2 DAYS, 2 X 2 DAYS, 1 X 2 DAYS  

## 2018-01-04 NOTE — Telephone Encounter (Signed)
Spoke with pt, c/o increased dyspnea, prod cough with yellow mucus, sinus congestion, pnd.   Denies fever, chest pain, body aches.   Not taking anythign to help with s/s.  Requesting recs- would like prednisone.    Pt uses [facility] on [address]   RA please advise on recs.  Thanks.

## 2018-01-04 NOTE — Telephone Encounter (Signed)
lmtcb X1 for pt  

## 2018-01-04 NOTE — Telephone Encounter (Signed)
Per text from RA, send to DOD.   CY, please advise. Thanks!

## 2018-01-17 DIAGNOSIS — H3554 Dystrophies primarily involving the retinal pigment epithelium: Secondary | ICD-10-CM | POA: Diagnosis not present

## 2018-01-17 DIAGNOSIS — H52223 Regular astigmatism, bilateral: Secondary | ICD-10-CM | POA: Diagnosis not present

## 2018-01-17 DIAGNOSIS — D8689 Sarcoidosis of other sites: Secondary | ICD-10-CM | POA: Diagnosis not present

## 2018-01-17 DIAGNOSIS — H524 Presbyopia: Secondary | ICD-10-CM | POA: Diagnosis not present

## 2018-01-25 DIAGNOSIS — R1310 Dysphagia, unspecified: Secondary | ICD-10-CM | POA: Diagnosis not present

## 2018-01-25 DIAGNOSIS — R1030 Lower abdominal pain, unspecified: Secondary | ICD-10-CM | POA: Diagnosis not present

## 2018-01-25 DIAGNOSIS — K580 Irritable bowel syndrome with diarrhea: Secondary | ICD-10-CM | POA: Diagnosis not present

## 2018-02-05 ENCOUNTER — Ambulatory Visit: Payer: BLUE CROSS/BLUE SHIELD | Admitting: Adult Health

## 2018-02-09 ENCOUNTER — Ambulatory Visit: Payer: BLUE CROSS/BLUE SHIELD | Admitting: Adult Health

## 2018-02-13 ENCOUNTER — Ambulatory Visit: Payer: BLUE CROSS/BLUE SHIELD | Admitting: Adult Health

## 2018-02-13 ENCOUNTER — Telehealth: Payer: Self-pay | Admitting: Adult Health

## 2018-02-13 ENCOUNTER — Other Ambulatory Visit (INDEPENDENT_AMBULATORY_CARE_PROVIDER_SITE_OTHER): Payer: BLUE CROSS/BLUE SHIELD

## 2018-02-13 ENCOUNTER — Encounter: Payer: Self-pay | Admitting: Adult Health

## 2018-02-13 VITALS — BP 128/80 | HR 86 | Ht 65.0 in | Wt 275.0 lb

## 2018-02-13 DIAGNOSIS — R0602 Shortness of breath: Secondary | ICD-10-CM | POA: Diagnosis not present

## 2018-02-13 DIAGNOSIS — D869 Sarcoidosis, unspecified: Secondary | ICD-10-CM

## 2018-02-13 LAB — BASIC METABOLIC PANEL
BUN: 11 mg/dL (ref 6–23)
CHLORIDE: 104 meq/L (ref 96–112)
CO2: 33 meq/L — AB (ref 19–32)
CREATININE: 0.92 mg/dL (ref 0.40–1.20)
Calcium: 9.3 mg/dL (ref 8.4–10.5)
GFR: 84.19 mL/min (ref >60.00)
Glucose, Bld: 104 mg/dL — ABNORMAL HIGH (ref 70–99)
POTASSIUM: 4 meq/L (ref 3.5–5.1)
Sodium: 139 mEq/L (ref 135–145)

## 2018-02-13 LAB — BRAIN NATRIURETIC PEPTIDE: Pro B Natriuretic peptide (BNP): 12 pg/mL (ref 0.0–100.0)

## 2018-02-13 MED ORDER — FUROSEMIDE 20 MG PO TABS: 20.0000 mg | ORAL_TABLET | Freq: Every day | ORAL | 5 refills | Status: DC

## 2018-02-13 NOTE — Progress Notes (Signed)
@Patient  ID: Elizabeth Riggs, female    DOB: 05-16-1971, 47 y.o.   MRN: [id number]  Chief Complaint  Patient presents with  . 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 She presented in 02/2015 with weight loss, intractable nausea and vomiting Had lap chole 03/12/15 and was told that she had a growth on her GB  Pathology showed non-caseating granulomas.   Significant tests/ events  Chest x-ray 12/2014 showed right hilar prominence which was new compared to 05/2014 CT chest 03/2015 - mediastinal lymphadenopathy-subcarinal and precarinal, scattered subcentimeter nodules CT abdomen from 2010 -clear bases PFTs 04/2015 -nml  Echo 08/2015 showed nml EF,Mild LVH c/w HTN  Spirometry 10/2017  shows slightly decreased to stable lung function with FVC at 70%, FEV1 71%, ratio 83.    02/13/2018 Follow up : Sarcoid  Pt returns for 3 month follow up . Says over all she is doing okay . Had flare of symptoms last month with increased cough and congestion . She says she had an URI  . She was called in prednisone taper . Says she is doing better. Got new job , feeling better. No flare of cough or wheezing .  CXR in Nov showed increased interstial  prominence .  No fever, chest pain or edema .  Says she is had not been taking her lasix or aldactone for few months  No increased LE edema . Has ov with nephrology in few weeks.   Allergies  Allergen Reactions  . Morphine And Related Itching  . Lisinopril Other (See Comments) and Rash    headache    Immunization History  Administered Date(s) Administered  . Influenza Split 12/23/2016, 12/26/2017    Past Medical History:  Diagnosis Date  . Chronic pain of both knees   . Gallstones   . Hypertension   . Obesity   . Sarcoidosis   . Thyroid disease     Tobacco History: Social History   Tobacco Use  Smoking Status Never Smoker  Smokeless Tobacco Never Used     Counseling given: Not Answered   Outpatient Encounter Medications as of 02/13/2018  Medication Sig  . dicyclomine (BENTYL) 20 MG tablet Take 1 tablet (20 mg total) by mouth 2 (two) times daily.  . furosemide (LASIX) 20 MG tablet Take 1 tablet (20 mg total) by mouth daily.  Marland Kitchen levothyroxine (SYNTHROID, LEVOTHROID) 125 MCG tablet Take 1 tablet (125 mcg total) by mouth daily.  Marland Kitchen omeprazole (PRILOSEC) 20 MG capsule Take 1 capsule (20 mg total) by mouth daily.  . pantoprazole (PROTONIX) 20 MG tablet Take 1 tablet (20 mg total) by mouth daily.  Marland Kitchen spironolactone (ALDACTONE) 50 MG tablet Take 50 mg by mouth daily.  . nitroGLYCERIN (NITRODUR - DOSED IN MG/24 HR) 0.2 mg/hr patch Apply 1/4th patch to affected area, change daily (Patient not taking: Reported on 02/13/2018)  . potassium chloride SA (K-DUR,KLOR-CON) 20 MEQ tablet Take 40 mEq by mouth 2 (two) times daily.  . [DISCONTINUED] naproxen (NAPROSYN) 500 MG tablet Take 1 tablet (500 mg total) by mouth 2 (two) times daily as needed. (Patient not taking: Reported on 02/13/2018)  . [DISCONTINUED] nystatin (MYCOSTATIN) 100000 UNIT/ML suspension Swish, gargle, and spit 5 mL 3-4 times per day. (Patient not taking: Reported on 02/13/2018)  . [DISCONTINUED] ondansetron (ZOFRAN ODT) 4 MG disintegrating tablet Take 1 tablet (4 mg total) by mouth every 8 (eight) hours as needed for nausea  or vomiting. (Patient not taking: Reported on 02/13/2018)  . [DISCONTINUED] predniSONE (DELTASONE) 10 MG tablet 4 tab x 2 days, 3 tabs x 2 days, 2 tabs x 2 days, 1 x 2 days then stop (Patient not taking: Reported on 02/13/2018)   No facility-administered encounter medications on file as of 02/13/2018.      Review of Systems  Constitutional:   No  weight loss, night sweats,  Fevers, chills, fatigue, or  lassitude.  HEENT:   No headaches,  Difficulty swallowing,  Tooth/dental problems, or  Sore throat,                No sneezing, itching, ear ache, nasal congestion, post nasal  drip,   CV:  No chest pain,  Orthopnea, PND, swelling in lower extremities, anasarca, dizziness, palpitations, syncope.   GI  No heartburn, indigestion, abdominal pain, nausea, vomiting, diarrhea, change in bowel habits, loss of appetite, bloody stools.   Resp: .  No chest wall deformity  Skin: no rash or lesions.  GU: no dysuria, change in color of urine, no urgency or frequency.  No flank pain, no hematuria   MS:  No joint pain or swelling.  No decreased range of motion.  No back pain.    Physical Exam  BP 128/80 (BP Location: Left Arm, Cuff Size: Large)   Pulse 86   Ht 5\' 5"  (1.651 m)   Wt 275 lb (124.7 kg)   SpO2 99%   BMI 45.76 kg/m   GEN: A/Ox3; pleasant , NAD, obese    HEENT:  [place]/AT,  EACs-clear, TMs-wnl, NOSE-clear, THROAT-clear, no lesions, no postnasal drip or exudate noted.   NECK:  Supple w/ fair ROM; no JVD; normal carotid impulses w/o bruits; no thyromegaly or nodules palpated; no lymphadenopathy.    RESP  Clear  P & A; w/o, wheezes/ rales/ or rhonchi. no accessory muscle use, no dullness to percussion  CARD:  RRR, no m/r/g, no peripheral edema, pulses intact, no cyanosis or clubbing.  GI:   Soft & nt; nml bowel sounds; no organomegaly or masses detected.   Musco: Warm bil, no deformities or joint swelling noted.   Neuro: alert, no focal deficits noted.    Skin: Warm, no lesions or rashes    Lab Results:  CBC    Component Value Date/Time   WBC 8.0 03/09/2017 1336   RBC 4.39 03/09/2017 1336   HGB 12.4 03/09/2017 1336   HCT 36.7 03/09/2017 1336   PLT 265 03/09/2017 1336   MCV 83.6 03/09/2017 1336   MCH 28.2 03/09/2017 1336   MCHC 33.8 03/09/2017 1336   RDW 15.6 (H) 03/09/2017 1336   LYMPHSABS 1.5 03/09/2017 1336   MONOABS 1.2 (H) 03/09/2017 1336   EOSABS 0.2 03/09/2017 1336   BASOSABS 0.0 03/09/2017 1336    BMET    Component Value Date/Time   NA 137 03/09/2017 1336   K 3.4 (L) 03/09/2017 1336   CL 104 03/09/2017 1336   CO2 27  03/09/2017 1336   GLUCOSE 98 03/09/2017 1336   BUN 9 03/09/2017 1336   CREATININE 0.78 03/09/2017 1336   CREATININE 0.78 01/08/2016 1540   CALCIUM 8.8 (L) 03/09/2017 1336   GFRNONAA >60 03/09/2017 1336   GFRAA >60 03/09/2017 1336    BNP No results found for: BNP  ProBNP No results found for: PROBNP  Imaging: No results found.   Assessment & Plan:   No problem-specific Assessment & Plan notes found for this encounter.     Tammy  Parrett, NP 02/13/2018

## 2018-02-13 NOTE — Telephone Encounter (Signed)
Called and spoke to patient regarding results per Rexene Edison, NP. Patient verbalized understanding, thanked staff for calling, and had no questions at this time.

## 2018-02-13 NOTE — Patient Instructions (Signed)
May use Lasix 20mg  daily As needed  Edema  Labs today .  Follow up with Dr. Elsworth Soho  In 2-3 months with PFT w/ [facility]  Please contact office for sooner follow up if symptoms do not improve or worsen or seek emergency care

## 2018-02-13 NOTE — Progress Notes (Signed)
47 y/o F with PMH: sarcoidosis, hypothyroidism, and HTN.  Presents today for 3 month follow up on her sarcoidosis.  States she has been doing well over all.  Pt completed a course of prednisone taper in January 2019 due to sarcoidosis symptoms. Endorses SOB with exertion today.  SOB while working and climbing stairs.  Denies: cough, fevers,  Endorses weight gain Pt. Has gained 30 lbs in the last 5 months.    PE:  GEN: A/Ox3; pleasant , NAD obese    HEENT:  [place]/AT,  EACs-clear, TMs-wnl, NOSE-clear, THROAT-clear, no lesions, no postnasal drip or exudate noted.   NECK:  Supple w/ fair ROM; no JVD; normal carotid impulses w/o bruits; no thyromegaly or nodules palpated; no lymphadenopathy.    RESP  Clear  P & A; w/o, wheezes/ rales/ or rhonchi. no accessory muscle use  CARD:  RRR, no m/r/g, trace of peripheral edema, pulses intact, no cyanosis or clubbing.  GI:   Soft & nt; nml bowel sounds; no organomegaly or masses detected.   Musco: Warm bil, no deformities or joint swelling noted.   Neuro: alert, no focal deficits noted.    Skin: Warm, no lesions or rashes

## 2018-02-13 NOTE — Assessment & Plan Note (Signed)
Recent flare now resolved  PFT w/ [facility] on return , if drop in PFT , may need to check CT chest   Plan  Patient Instructions  May use Lasix 20mg  daily As needed  Edema  Labs today .  Follow up with Dr. Elsworth Soho  In 2-3 months with PFT w/ [facility]  Please contact office for sooner follow up if symptoms do not improve or worsen or seek emergency care

## 2018-03-08 ENCOUNTER — Encounter: Payer: Self-pay | Admitting: Family Medicine

## 2018-03-08 ENCOUNTER — Ambulatory Visit: Payer: BLUE CROSS/BLUE SHIELD | Admitting: Family Medicine

## 2018-03-08 DIAGNOSIS — M722 Plantar fascial fibromatosis: Secondary | ICD-10-CM

## 2018-03-08 MED ORDER — MELOXICAM 15 MG PO TABS: 15.0000 mg | ORAL_TABLET | Freq: Every day | ORAL | 2 refills | Status: DC

## 2018-03-08 NOTE — Patient Instructions (Signed)
You have plantar fasciitis Meloxicam 15 mg daily with food for pain and inflammation. Ok to take tylenol as needed in addition to this. Plantar fascia stretch for 20-30 seconds (do 3 of these) in morning Lowering/raise on a step exercises 3 x 10 once or twice a day - this is very important for long term recovery. Can add heel walks, toe walks forward and backward as well Ice heel for 15 minutes as needed. Avoid flat shoes/barefoot walking as much as possible. Arch straps have been shown to help with pain. Inserts are important (dr. Zoe Lan active series, spencos, our green insoles, custom orthotics). Steroid injection is a consideration for short term pain relief if you are struggling. Physical therapy is also an option. Follow up with me in 6 weeks but call me sooner if you want to try an injection or if you want [place] to fit you with the green sports insoles.

## 2018-03-09 ENCOUNTER — Encounter: Payer: Self-pay | Admitting: Family Medicine

## 2018-03-09 DIAGNOSIS — M722 Plantar fascial fibromatosis: Secondary | ICD-10-CM | POA: Insufficient documentation

## 2018-03-09 NOTE — Assessment & Plan Note (Signed)
meloxicam daily with food for pain and inflammation.  Reviewed home exercises and stretches to do daily.  Icing as well.  Heart binders and inserts stressed.  Consider cortisone injection if not improving.  Physical therapy also an option.  Follow-up with me in 6 weeks.

## 2018-03-09 NOTE — Progress Notes (Signed)
PCP: Sinclair Ship, MD  Subjective:   HPI: Patient is a 47 y.o. female here for right heel pain.  Patient reports for about 1 week she has had fairly severe pain on the plantar and medial aspect of her right heel. She reports pain is sharp and worse by the end of the day. She has tried Aleve and rolling this on a can. No other treatment to date. No skin changes, numbness. Pain level 8 out of 10.  Past Medical History:  Diagnosis Date  . Chronic pain of both knees   . Gallstones   . Hypertension   . Obesity   . Sarcoidosis   . Thyroid disease     Current Outpatient Medications on File Prior to Visit  Medication Sig Dispense Refill  . sucralfate (CARAFATE) 1 g tablet Take by mouth.    . triamterene-hydrochlorothiazide (DYAZIDE) 37.5-25 MG capsule Take by mouth.    . dicyclomine (BENTYL) 20 MG tablet Take 1 tablet (20 mg total) by mouth 2 (two) times daily. 20 tablet 0  . furosemide (LASIX) 20 MG tablet Take 1 tablet (20 mg total) by mouth daily. 30 tablet 5  . levothyroxine (SYNTHROID, LEVOTHROID) 125 MCG tablet Take 1 tablet (125 mcg total) by mouth daily. 30 tablet 0  . nitroGLYCERIN (NITRODUR - DOSED IN MG/24 HR) 0.2 mg/hr patch Apply 1/4th patch to affected area, change daily (Patient not taking: Reported on 02/13/2018) 30 patch 1  . omeprazole (PRILOSEC) 20 MG capsule Take 1 capsule (20 mg total) by mouth daily. 30 capsule 0  . pantoprazole (PROTONIX) 20 MG tablet Take 1 tablet (20 mg total) by mouth daily. 14 tablet 0  . potassium chloride SA (K-DUR,KLOR-CON) 20 MEQ tablet Take 40 mEq by mouth 2 (two) times daily.    Marland Kitchen spironolactone (ALDACTONE) 50 MG tablet Take 50 mg by mouth daily.     No current facility-administered medications on file prior to visit.     Past Surgical History:  Procedure Laterality Date  . ABDOMINAL HYSTERECTOMY    . CHOLECYSTECTOMY  03/12/15   [facility] in [place]  . KNEE SURGERY    . right knee surgery      Allergies   Allergen Reactions  . Morphine And Related Itching  . Lisinopril Other (See Comments) and Rash    headache    Social History   Socioeconomic History  . Marital status: Single    Spouse name: Not on file  . Number of children: Not on file  . Years of education: Not on file  . Highest education level: Not on file  Social Needs  . Financial resource strain: Not on file  . Food insecurity - worry: Not on file  . Food insecurity - inability: Not on file  . Transportation needs - medical: Not on file  . Transportation needs - non-medical: Not on file  Occupational History  . Not on file  Tobacco Use  . Smoking status: Never Smoker  . Smokeless tobacco: Never Used  Substance and Sexual Activity  . Alcohol use: No    Alcohol/week: 0.0 oz  . Drug use: No  . Sexual activity: Not on file  Other Topics Concern  . Not on file  Social History Narrative   She works as a Quarry manager at [employer]   3 children- all live locally, she has 3 grand children.   70- son   63- son   74- son   Completed 12th grade  Enjoys church, spending time with grand children.    Family History  Problem Relation Age of Onset  . Hypertension Mother   . Diabetes Father   . Hypertension Father   . Cancer Maternal Grandmother        breast  . Cancer Maternal Grandfather        prostate  . Heart attack Neg Hx     BP 110/81   Pulse 80   Ht 5\' 5"  (1.651 m)   Wt 250 lb (113.4 kg)   BMI 41.60 kg/m   Review of Systems: See HPI above.     Objective:  Physical Exam:  Gen: NAD, comfortable in exam room  Right foot/ankle: Pes planus.  No gross deformity, swelling, ecchymoses FROM with 5/5 strength without pain. TTP plantar fascia at insertion on calcaneus medially. Negative ant drawer and talar tilt. Negative syndesmotic compression. Negative calcaneal squeeze. Thompsons test negative. NV intact distally.  Left foot/ankle: No deformity. FROM with 5/5 strength. No  tenderness to palpation. NVI distally.   Assessment & Plan:  1. Right plantar fasciitis -meloxicam daily with food for pain and inflammation.  Reviewed home exercises and stretches to do daily.  Icing as well.  Heart binders and inserts stressed.  Consider cortisone injection if not improving.  Physical therapy also an option.  Follow-up with me in 6 weeks.

## 2018-03-17 NOTE — Progress Notes (Signed)
Reviewed & agree with plan  

## 2018-04-03 DIAGNOSIS — D869 Sarcoidosis, unspecified: Secondary | ICD-10-CM | POA: Diagnosis not present

## 2018-04-04 DIAGNOSIS — D869 Sarcoidosis, unspecified: Secondary | ICD-10-CM | POA: Diagnosis not present

## 2018-04-05 DIAGNOSIS — D869 Sarcoidosis, unspecified: Secondary | ICD-10-CM | POA: Diagnosis not present

## 2018-04-09 DIAGNOSIS — D869 Sarcoidosis, unspecified: Secondary | ICD-10-CM | POA: Diagnosis not present

## 2018-04-11 DIAGNOSIS — D869 Sarcoidosis, unspecified: Secondary | ICD-10-CM | POA: Diagnosis not present

## 2018-04-16 DIAGNOSIS — D869 Sarcoidosis, unspecified: Secondary | ICD-10-CM | POA: Diagnosis not present

## 2018-04-18 DIAGNOSIS — D869 Sarcoidosis, unspecified: Secondary | ICD-10-CM | POA: Diagnosis not present

## 2018-04-19 DIAGNOSIS — D869 Sarcoidosis, unspecified: Secondary | ICD-10-CM | POA: Diagnosis not present

## 2018-04-25 DIAGNOSIS — D869 Sarcoidosis, unspecified: Secondary | ICD-10-CM | POA: Diagnosis not present

## 2018-04-26 ENCOUNTER — Ambulatory Visit: Payer: BLUE CROSS/BLUE SHIELD | Admitting: Family Medicine

## 2018-04-26 ENCOUNTER — Encounter: Payer: Self-pay | Admitting: Family Medicine

## 2018-04-26 DIAGNOSIS — M7661 Achilles tendinitis, right leg: Secondary | ICD-10-CM | POA: Diagnosis not present

## 2018-04-26 MED ORDER — NITROGLYCERIN 0.2 MG/HR TD PT24: MEDICATED_PATCH | TRANSDERMAL | 1 refills | Status: DC

## 2018-04-26 NOTE — Patient Instructions (Signed)
Use the boot with heel lift in it for at least 2 weeks. When tolerated you can switch out of this into a regular shoe with a heel lift. Icing 15 minutes at a time 3-4 times a day. Tylenol, aleve, and/or topical medications (biofreeze, capsaicin) as needed for pain. Nitro patches - 1/4th patch to the achilles, change daily. Follow up with me in 1 month for reevaluation.

## 2018-04-27 ENCOUNTER — Encounter: Payer: Self-pay | Admitting: Family Medicine

## 2018-04-27 DIAGNOSIS — M7661 Achilles tendinitis, right leg: Secondary | ICD-10-CM | POA: Insufficient documentation

## 2018-04-27 NOTE — Progress Notes (Signed)
PCP: Sinclair Ship, MD  Subjective:   HPI: Elizabeth Riggs is a 47 y.o. female here for right heel pain.  3/14: Elizabeth Riggs reports for about 1 week she has had fairly severe pain on the plantar and medial aspect of her right heel. She reports pain is sharp and worse by the end of the day. She has tried Aleve and rolling this on a can. No other treatment to date. No skin changes, numbness. Pain level 8 out of 10.  5/2: Elizabeth Riggs returns with worsening pain more in posterior heel now. Pain level 9/10 and sharp. Worse with walking. She has been rolling bottom of her foot on a bottle, using arch binder but not wearing today. Not using inserts, doing home exercises otherwise. No skin changes.  Past Medical History:  Diagnosis Date  . Chronic pain of both knees   . Gallstones   . Hypertension   . Obesity   . Sarcoidosis   . Thyroid disease     Current Outpatient Medications on File Prior to Visit  Medication Sig Dispense Refill  . dicyclomine (BENTYL) 20 MG tablet Take 1 tablet (20 mg total) by mouth 2 (two) times daily. 20 tablet 0  . furosemide (LASIX) 20 MG tablet Take 1 tablet (20 mg total) by mouth daily. 30 tablet 5  . levothyroxine (SYNTHROID, LEVOTHROID) 125 MCG tablet Take 1 tablet (125 mcg total) by mouth daily. 30 tablet 0  . meloxicam (MOBIC) 15 MG tablet Take 1 tablet (15 mg total) by mouth daily. 30 tablet 2  . nitroGLYCERIN (NITRODUR - DOSED IN MG/24 HR) 0.2 mg/hr patch Apply 1/4th patch to affected area, change daily (Elizabeth Riggs not taking: Reported on 02/13/2018) 30 patch 1  . omeprazole (PRILOSEC) 20 MG capsule Take 1 capsule (20 mg total) by mouth daily. 30 capsule 0  . pantoprazole (PROTONIX) 20 MG tablet Take 1 tablet (20 mg total) by mouth daily. 14 tablet 0  . potassium chloride SA (K-DUR,KLOR-CON) 20 MEQ tablet Take 40 mEq by mouth 2 (two) times daily.    Marland Kitchen spironolactone (ALDACTONE) 50 MG tablet Take 50 mg by mouth daily.    . sucralfate (CARAFATE) 1 g tablet Take by  mouth.    . triamterene-hydrochlorothiazide (DYAZIDE) 37.5-25 MG capsule Take by mouth.     No current facility-administered medications on file prior to visit.     Past Surgical History:  Procedure Laterality Date  . ABDOMINAL HYSTERECTOMY    . CHOLECYSTECTOMY  03/12/15   [facility] in [place]  . KNEE SURGERY    . right knee surgery      Allergies  Allergen Reactions  . Morphine And Related Itching  . Lisinopril Other (See Comments) and Rash    headache    Social History   Socioeconomic History  . Marital status: Single    Spouse name: Not on file  . Number of children: Not on file  . Years of education: Not on file  . Highest education level: Not on file  Occupational History  . Not on file  Social Needs  . Financial resource strain: Not on file  . Food insecurity:    Worry: Not on file    Inability: Not on file  . Transportation needs:    Medical: Not on file    Non-medical: Not on file  Tobacco Use  . Smoking status: Never Smoker  . Smokeless tobacco: Never Used  Substance and Sexual Activity  . Alcohol use: No    Alcohol/week: 0.0 oz  .  Drug use: No  . Sexual activity: Not on file  Lifestyle  . Physical activity:    Days per week: Not on file    Minutes per session: Not on file  . Stress: Not on file  Relationships  . Social connections:    Talks on phone: Not on file    Gets together: Not on file    Attends religious service: Not on file    Active member of club or organization: Not on file    Attends meetings of clubs or organizations: Not on file    Relationship status: Not on file  . Intimate partner violence:    Fear of current or ex partner: Not on file    Emotionally abused: Not on file    Physically abused: Not on file    Forced sexual activity: Not on file  Other Topics Concern  . Not on file  Social History Narrative   She works as a Quarry manager at [employer]   3 children- all live locally, she has 3 grand  children.   38- son   63- son   63- son   Completed 12th grade   Enjoys church, spending time with grand children.    Family History  Problem Relation Age of Onset  . Hypertension Mother   . Diabetes Father   . Hypertension Father   . Cancer Maternal Grandmother        breast  . Cancer Maternal Grandfather        prostate  . Heart attack Neg Hx     BP (!) 143/79   Pulse 80   Ht 5\' 5"  (1.651 m)   Wt 265 lb (120.2 kg)   BMI 44.10 kg/m   Review of Systems: See HPI above.     Objective:  Physical Exam:  Gen: NAD, comfortable in exam room  Right foot/ankle: Pes planus.  No gross deformity, swelling, ecchymoses FROM with 5/5 strength and mild pain plantarflexion, full dorsiflexion. TTP insertion of achilles on calcaneus. Negative ant drawer and talar tilt.   Negative syndesmotic compression. Negative calcaneal squeeze. Thompsons test negative. NV intact distally.   Assessment & Plan:  1. Right heel pain - plantar fasciitis has improved, now with achilles tendinitis.  Cam walker with heel lift.  Icing, tylenol, aleve, topical medications.  Nitro patches and change daily.  F/u in 1 month for reevaluation.

## 2018-04-27 NOTE — Assessment & Plan Note (Signed)
plantar fasciitis has improved, now with achilles tendinitis.  Cam walker with heel lift.  Icing, tylenol, aleve, topical medications.  Nitro patches and change daily.  F/u in 1 month for reevaluation.

## 2018-05-01 DIAGNOSIS — I83893 Varicose veins of bilateral lower extremities with other complications: Secondary | ICD-10-CM | POA: Diagnosis not present

## 2018-05-01 DIAGNOSIS — I872 Venous insufficiency (chronic) (peripheral): Secondary | ICD-10-CM | POA: Diagnosis not present

## 2018-05-02 DIAGNOSIS — D869 Sarcoidosis, unspecified: Secondary | ICD-10-CM | POA: Diagnosis not present

## 2018-05-03 ENCOUNTER — Ambulatory Visit: Payer: BLUE CROSS/BLUE SHIELD | Admitting: Family Medicine

## 2018-05-03 ENCOUNTER — Encounter

## 2018-05-03 DIAGNOSIS — D869 Sarcoidosis, unspecified: Secondary | ICD-10-CM | POA: Diagnosis not present

## 2018-05-09 DIAGNOSIS — D869 Sarcoidosis, unspecified: Secondary | ICD-10-CM | POA: Diagnosis not present

## 2018-05-14 DIAGNOSIS — D869 Sarcoidosis, unspecified: Secondary | ICD-10-CM | POA: Diagnosis not present

## 2018-05-16 DIAGNOSIS — D869 Sarcoidosis, unspecified: Secondary | ICD-10-CM | POA: Diagnosis not present

## 2018-05-17 ENCOUNTER — Ambulatory Visit (INDEPENDENT_AMBULATORY_CARE_PROVIDER_SITE_OTHER): Payer: BLUE CROSS/BLUE SHIELD | Admitting: Family Medicine

## 2018-05-17 ENCOUNTER — Encounter: Payer: Self-pay | Admitting: Family Medicine

## 2018-05-17 DIAGNOSIS — M7661 Achilles tendinitis, right leg: Secondary | ICD-10-CM

## 2018-05-17 MED ORDER — IBUPROFEN 800 MG PO TABS: 800.0000 mg | ORAL_TABLET | Freq: Three times a day (TID) | ORAL | 1 refills | Status: DC | PRN

## 2018-05-17 NOTE — Patient Instructions (Signed)
Continue the boot with heel lift. When tolerated you can switch out of this into a regular shoe with a heel lift. Icing 15 minutes at a time 3-4 times a day. Ibuprofen 800mg  three times a day with food for pain and inflammation. Start physical therapy and do home exercises on days you don't go to therapy. If still struggling we can consider surgical referral but I'd strongly recommend against this if at all possible. We discussed work restrictions for a short period too. Follow up with me in 6 weeks.

## 2018-05-19 ENCOUNTER — Other Ambulatory Visit: Payer: Self-pay | Admitting: Family Medicine

## 2018-05-20 ENCOUNTER — Encounter: Payer: Self-pay | Admitting: Family Medicine

## 2018-05-20 NOTE — Progress Notes (Signed)
PCP: Sinclair Ship, MD  Subjective:   HPI: Patient is a 47 y.o. female here for right heel pain.  3/14: Patient reports for about 1 week she has had fairly severe pain on the plantar and medial aspect of her right heel. She reports pain is sharp and worse by the end of the day. She has tried Aleve and rolling this on a can. No other treatment to date. No skin changes, numbness. Pain level 8 out of 10.  5/2: Patient returns with worsening pain more in posterior heel now. Pain level 9/10 and sharp. Worse with walking. She has been rolling bottom of her foot on a bottle, using arch binder but not wearing today. Not using inserts, doing home exercises otherwise. No skin changes.  5/23: Patient reports she's struggling with pain posterior right heel. She's wearing boot but noted she is not wearing this correctly. Has heel lift in this. Tried nitro patches. Pain level 9/10 and sharp, worse with walking. No skin changes.  Past Medical History:  Diagnosis Date  . Chronic pain of both knees   . Gallstones   . Hypertension   . Obesity   . Sarcoidosis   . Thyroid disease     Current Outpatient Medications on File Prior to Visit  Medication Sig Dispense Refill  . potassium chloride (KLOR-CON 10) 10 MEQ tablet TAKE 1 TABLET BY MOUTH THREE TIMES A WEEK TAKE MEDICATION ON THE DAYS YOU TAKE YOUR FUROSEMIDE    . dicyclomine (BENTYL) 20 MG tablet Take 1 tablet (20 mg total) by mouth 2 (two) times daily. 20 tablet 0  . furosemide (LASIX) 20 MG tablet Take 1 tablet (20 mg total) by mouth daily. 30 tablet 5  . levothyroxine (SYNTHROID, LEVOTHROID) 125 MCG tablet Take 1 tablet (125 mcg total) by mouth daily. 30 tablet 0  . meloxicam (MOBIC) 15 MG tablet Take 1 tablet (15 mg total) by mouth daily. 30 tablet 2  . nitroGLYCERIN (NITRODUR - DOSED IN MG/24 HR) 0.2 mg/hr patch Apply 1/4th patch to affected achilles, change daily 30 patch 1  . omeprazole (PRILOSEC) 20 MG capsule Take 1 capsule (20  mg total) by mouth daily. 30 capsule 0  . spironolactone (ALDACTONE) 50 MG tablet Take 50 mg by mouth daily.    . sucralfate (CARAFATE) 1 g tablet Take by mouth.    . triamterene-hydrochlorothiazide (DYAZIDE) 37.5-25 MG capsule Take by mouth.     No current facility-administered medications on file prior to visit.     Past Surgical History:  Procedure Laterality Date  . ABDOMINAL HYSTERECTOMY    . CHOLECYSTECTOMY  03/12/15   [facility] in [place]  . KNEE SURGERY    . right knee surgery      Allergies  Allergen Reactions  . Morphine And Related Itching  . Lisinopril Other (See Comments) and Rash    headache    Social History   Socioeconomic History  . Marital status: Single    Spouse name: Not on file  . Number of children: Not on file  . Years of education: Not on file  . Highest education level: Not on file  Occupational History  . Not on file  Social Needs  . Financial resource strain: Not on file  . Food insecurity:    Worry: Not on file    Inability: Not on file  . Transportation needs:    Medical: Not on file    Non-medical: Not on file  Tobacco Use  . Smoking status:  Never Smoker  . Smokeless tobacco: Never Used  Substance and Sexual Activity  . Alcohol use: No    Alcohol/week: 0.0 oz  . Drug use: No  . Sexual activity: Not on file  Lifestyle  . Physical activity:    Days per week: Not on file    Minutes per session: Not on file  . Stress: Not on file  Relationships  . Social connections:    Talks on phone: Not on file    Gets together: Not on file    Attends religious service: Not on file    Active member of club or organization: Not on file    Attends meetings of clubs or organizations: Not on file    Relationship status: Not on file  . Intimate partner violence:    Fear of current or ex partner: Not on file    Emotionally abused: Not on file    Physically abused: Not on file    Forced sexual activity: Not on file  Other  Topics Concern  . Not on file  Social History Narrative   She works as a Quarry manager at [employer]   3 children- all live locally, she has 3 grand children.   18- son   68- son   74- son   Completed 12th grade   Enjoys church, spending time with grand children.    Family History  Problem Relation Age of Onset  . Hypertension Mother   . Diabetes Father   . Hypertension Father   . Cancer Maternal Grandmother        breast  . Cancer Maternal Grandfather        prostate  . Heart attack Neg Hx     BP 122/76   Pulse 83   Ht 5\' 5"  (1.651 m)   Wt 252 lb (114.3 kg)   BMI 41.93 kg/m   Review of Systems: See HPI above.     Objective:  Physical Exam:  Gen: NAD, comfortable in exam room  Right foot/ankle: Pes planus.  No other gross deformity, swelling, ecchymoses FROM with 5/5 strength all directions.  Pain dorsiflexion and plantarflexion. TTP insertion of achilles on calcaneus. Negative ant drawer and talar tilt.   Negative calcaneal squeeze. Thompsons test negative. NV intact distally.   Assessment & Plan:  1. Right heel pain - 2/2 achilles tendinitis  Continue cam walker with heel lift - shown how to wear properly.  Start physical therapy and home exercise program.  Icing, ibuprofen.  F/u in 6 weeks.

## 2018-05-20 NOTE — Assessment & Plan Note (Addendum)
Continue cam walker with heel lift - shown how to wear properly.  Start physical therapy and home exercise program.  Icing, ibuprofen.  F/u in 6 weeks.

## 2018-05-23 DIAGNOSIS — D869 Sarcoidosis, unspecified: Secondary | ICD-10-CM | POA: Diagnosis not present

## 2018-05-24 DIAGNOSIS — D869 Sarcoidosis, unspecified: Secondary | ICD-10-CM | POA: Diagnosis not present

## 2018-05-28 ENCOUNTER — Ambulatory Visit: Payer: BLUE CROSS/BLUE SHIELD | Admitting: Family Medicine

## 2018-05-30 DIAGNOSIS — Z0000 Encounter for general adult medical examination without abnormal findings: Secondary | ICD-10-CM | POA: Diagnosis not present

## 2018-05-30 DIAGNOSIS — Z113 Encounter for screening for infections with a predominantly sexual mode of transmission: Secondary | ICD-10-CM | POA: Diagnosis not present

## 2018-06-01 DIAGNOSIS — M7661 Achilles tendinitis, right leg: Secondary | ICD-10-CM | POA: Diagnosis not present

## 2018-06-01 DIAGNOSIS — Z7409 Other reduced mobility: Secondary | ICD-10-CM | POA: Diagnosis not present

## 2018-06-01 DIAGNOSIS — R269 Unspecified abnormalities of gait and mobility: Secondary | ICD-10-CM | POA: Diagnosis not present

## 2018-06-11 DIAGNOSIS — Z7409 Other reduced mobility: Secondary | ICD-10-CM | POA: Diagnosis not present

## 2018-06-11 DIAGNOSIS — R269 Unspecified abnormalities of gait and mobility: Secondary | ICD-10-CM | POA: Diagnosis not present

## 2018-06-11 DIAGNOSIS — M7661 Achilles tendinitis, right leg: Secondary | ICD-10-CM | POA: Diagnosis not present

## 2018-06-14 DIAGNOSIS — M7661 Achilles tendinitis, right leg: Secondary | ICD-10-CM | POA: Diagnosis not present

## 2018-06-14 DIAGNOSIS — Z7409 Other reduced mobility: Secondary | ICD-10-CM | POA: Diagnosis not present

## 2018-06-14 DIAGNOSIS — R269 Unspecified abnormalities of gait and mobility: Secondary | ICD-10-CM | POA: Diagnosis not present

## 2018-06-14 DIAGNOSIS — Z01419 Encounter for gynecological examination (general) (routine) without abnormal findings: Secondary | ICD-10-CM | POA: Diagnosis not present

## 2018-06-27 ENCOUNTER — Ambulatory Visit: Payer: BLUE CROSS/BLUE SHIELD | Admitting: Family Medicine

## 2018-07-03 ENCOUNTER — Ambulatory Visit: Payer: BLUE CROSS/BLUE SHIELD | Admitting: Family Medicine

## 2018-07-03 ENCOUNTER — Encounter

## 2018-07-03 DIAGNOSIS — I83893 Varicose veins of bilateral lower extremities with other complications: Secondary | ICD-10-CM | POA: Diagnosis not present

## 2018-07-04 DIAGNOSIS — M7661 Achilles tendinitis, right leg: Secondary | ICD-10-CM | POA: Diagnosis not present

## 2018-07-04 DIAGNOSIS — Z7409 Other reduced mobility: Secondary | ICD-10-CM | POA: Diagnosis not present

## 2018-07-04 DIAGNOSIS — R269 Unspecified abnormalities of gait and mobility: Secondary | ICD-10-CM | POA: Diagnosis not present

## 2018-07-09 ENCOUNTER — Encounter: Payer: Self-pay | Admitting: Family Medicine

## 2018-07-09 ENCOUNTER — Ambulatory Visit: Payer: BLUE CROSS/BLUE SHIELD | Admitting: Family Medicine

## 2018-07-09 VITALS — BP 140/91 | HR 94 | Ht 65.0 in | Wt 245.0 lb

## 2018-07-09 DIAGNOSIS — M7661 Achilles tendinitis, right leg: Secondary | ICD-10-CM | POA: Diagnosis not present

## 2018-07-09 MED ORDER — DICLOFENAC SODIUM 1 % TD GEL: 2.0000 g | Freq: Four times a day (QID) | TRANSDERMAL | 2 refills | Status: DC

## 2018-07-09 NOTE — Progress Notes (Addendum)
PCP: Sinclair Ship, MD  Subjective:   HPI: Patient is a 47 y.o. female here for right heel pain.  3/14: Patient reports for about 1 week she has had fairly severe pain on the plantar and medial aspect of her right heel. She reports pain is sharp and worse by the end of the day. She has tried Aleve and rolling this on a can. No other treatment to date. No skin changes, numbness. Pain level 8 out of 10.  5/2: Patient returns with worsening pain more in posterior heel now. Pain level 9/10 and sharp. Worse with walking. She has been rolling bottom of her foot on a bottle, using arch binder but not wearing today. Not using inserts, doing home exercises otherwise. No skin changes.  5/23: Patient reports she's struggling with pain posterior right heel. She's wearing boot but noted she is not wearing this correctly. Has heel lift in this. Tried nitro patches. Pain level 9/10 and sharp, worse with walking. No skin changes.  7/15: Patient reports continued right heel pain which is worse at the end of the day. She states that it is relatively unchanged in location; pain is at the insertion of the achilles on the calcaneous. She has completed 4 weeks of physical and reports little improvement at this point. She does her home excises. She wears her cam boot on a rotating basis but is not wearing them at this time.  No skin changes.  Past Medical History:  Diagnosis Date  . Chronic pain of both knees   . Gallstones   . Hypertension   . Obesity   . Sarcoidosis   . Thyroid disease     Current Outpatient Medications on File Prior to Visit  Medication Sig Dispense Refill  . dicyclomine (BENTYL) 20 MG tablet Take 1 tablet (20 mg total) by mouth 2 (two) times daily. 20 tablet 0  . furosemide (LASIX) 20 MG tablet Take 1 tablet (20 mg total) by mouth daily. 30 tablet 5  . ibuprofen (ADVIL,MOTRIN) 800 MG tablet Take 1 tablet (800 mg total) by mouth every 8 (eight) hours as needed. 90 tablet 1   . levothyroxine (SYNTHROID, LEVOTHROID) 125 MCG tablet Take 1 tablet (125 mcg total) by mouth daily. 30 tablet 0  . meloxicam (MOBIC) 15 MG tablet Take 1 tablet (15 mg total) by mouth daily. 30 tablet 2  . nitroGLYCERIN (NITRODUR - DOSED IN MG/24 HR) 0.2 mg/hr patch Apply 1/4th patch to affected achilles, change daily 30 patch 1  . omeprazole (PRILOSEC) 20 MG capsule Take 1 capsule (20 mg total) by mouth daily. 30 capsule 0  . potassium chloride (KLOR-CON 10) 10 MEQ tablet TAKE 1 TABLET BY MOUTH THREE TIMES A WEEK TAKE MEDICATION ON THE DAYS YOU TAKE YOUR FUROSEMIDE    . spironolactone (ALDACTONE) 50 MG tablet Take 50 mg by mouth daily.    . sucralfate (CARAFATE) 1 g tablet Take by mouth.    . triamterene-hydrochlorothiazide (DYAZIDE) 37.5-25 MG capsule Take by mouth.     No current facility-administered medications on file prior to visit.     Past Surgical History:  Procedure Laterality Date  . ABDOMINAL HYSTERECTOMY    . CHOLECYSTECTOMY  03/12/15   [facility] in [place]  . KNEE SURGERY    . right knee surgery      Allergies  Allergen Reactions  . Morphine And Related Itching  . Lisinopril Other (See Comments) and Rash    headache    Social History  Socioeconomic History  . Marital status: Single    Spouse name: Not on file  . Number of children: Not on file  . Years of education: Not on file  . Highest education level: Not on file  Occupational History  . Not on file  Social Needs  . Financial resource strain: Not on file  . Food insecurity:    Worry: Not on file    Inability: Not on file  . Transportation needs:    Medical: Not on file    Non-medical: Not on file  Tobacco Use  . Smoking status: Never Smoker  . Smokeless tobacco: Never Used  Substance and Sexual Activity  . Alcohol use: No    Alcohol/week: 0.0 oz  . Drug use: No  . Sexual activity: Not on file  Lifestyle  . Physical activity:    Days per week: Not on file    Minutes per  session: Not on file  . Stress: Not on file  Relationships  . Social connections:    Talks on phone: Not on file    Gets together: Not on file    Attends religious service: Not on file    Active member of club or organization: Not on file    Attends meetings of clubs or organizations: Not on file    Relationship status: Not on file  . Intimate partner violence:    Fear of current or ex partner: Not on file    Emotionally abused: Not on file    Physically abused: Not on file    Forced sexual activity: Not on file  Other Topics Concern  . Not on file  Social History Narrative   She works as a Quarry manager at [employer]   3 children- all live locally, she has 3 grand children.   53- son   27- son   52- son   Completed 12th grade   Enjoys church, spending time with grand children.    Family History  Problem Relation Age of Onset  . Hypertension Mother   . Diabetes Father   . Hypertension Father   . Cancer Maternal Grandmother        breast  . Cancer Maternal Grandfather        prostate  . Heart attack Neg Hx     BP (!) 140/91   Pulse 94   Ht 5\' 5"  (1.651 m)   Wt 245 lb (111.1 kg)   BMI 40.77 kg/m   Review of Systems: No swelling. No bruising. No overlying skin changes.     Objective:  Physical Exam:  GEN: NAD, pt appears comfortable  Right foot/ankle: Pes Planus observed.  No gross deformity, swelling or ecchymosis. FROM with 5/5 strength in all planes of motion.  Mild pain reproduced with dorsiflexion. No pain with plantarflexion. TTP of achilles at the insertion on the calcaneous primarily on the medial aspect. Neg ant drawer Neg calcaneal squeeze NV intact distally   Assessment & Plan:  1. Right heel pain - 2/2 achilles tendinitis   - Continue cam walker with heel lift. Discussed need for more consistent wear - Continue physical therapy and home exercise program.  - will prescribe voltaren gel QID - discussed potential for  pro-inflammatory injections as a potential treatment option. - Contiue Icing and ibuprofen as needed.  F/u in 6 weeks.  Patient seen and examined with fellow - agree with his note and findings.

## 2018-07-09 NOTE — Patient Instructions (Signed)
Try salon pas patches over the counter. Voltaren gel up to 4 times a day topically. Try to wear heel lifts and/or boot during the day at all times. Continue your physical therapy, do home exercises on days you don't go to therapy. The shots we discussed are called PRP. Follow up with me in 6 weeks for reevaluation.

## 2018-07-09 NOTE — Assessment & Plan Note (Signed)
-   Continue cam walker with heel lift. Discussed need for more consistent wear - Continue physical therapy and home exercise program.  - will prescribe voltaren gel QID - discussed potential for pro-inflammatory injections as a potential treatment option. - Contiue Icing and ibuprofen as needed.  F/u in 6 weeks.

## 2018-07-10 DIAGNOSIS — R269 Unspecified abnormalities of gait and mobility: Secondary | ICD-10-CM | POA: Diagnosis not present

## 2018-07-10 DIAGNOSIS — M7661 Achilles tendinitis, right leg: Secondary | ICD-10-CM | POA: Diagnosis not present

## 2018-07-10 DIAGNOSIS — Z7409 Other reduced mobility: Secondary | ICD-10-CM | POA: Diagnosis not present

## 2018-07-26 ENCOUNTER — Other Ambulatory Visit: Payer: Self-pay | Admitting: Family Medicine

## 2018-08-06 ENCOUNTER — Telehealth: Payer: Self-pay | Admitting: Pulmonary Disease

## 2018-08-06 NOTE — Telephone Encounter (Signed)
Left message for patient to call back  

## 2018-08-07 ENCOUNTER — Ambulatory Visit (INDEPENDENT_AMBULATORY_CARE_PROVIDER_SITE_OTHER)
Admission: RE | Admit: 2018-08-07 | Discharge: 2018-08-07 | Disposition: A | Discharge status: Home / Self Care | Payer: BLUE CROSS/BLUE SHIELD | Source: Ambulatory Visit | Attending: Pulmonary Disease | Admitting: Pulmonary Disease

## 2018-08-07 ENCOUNTER — Ambulatory Visit (INDEPENDENT_AMBULATORY_CARE_PROVIDER_SITE_OTHER): Payer: BLUE CROSS/BLUE SHIELD | Admitting: Pulmonary Disease

## 2018-08-07 ENCOUNTER — Encounter: Payer: Self-pay | Admitting: Pulmonary Disease

## 2018-08-07 VITALS — BP 124/72 | HR 93 | Ht 65.0 in | Wt 266.0 lb

## 2018-08-07 DIAGNOSIS — D862 Sarcoidosis of lung with sarcoidosis of lymph nodes: Secondary | ICD-10-CM | POA: Diagnosis not present

## 2018-08-07 DIAGNOSIS — D869 Sarcoidosis, unspecified: Secondary | ICD-10-CM

## 2018-08-07 DIAGNOSIS — M79662 Pain in left lower leg: Secondary | ICD-10-CM | POA: Insufficient documentation

## 2018-08-07 NOTE — Patient Instructions (Signed)
Chest x-ray today. Schedule venous Doppler left leg for cyst

## 2018-08-07 NOTE — Assessment & Plan Note (Signed)
Suspect ruptured Baker's cyst. Does not appear to be cellulitis or DVT. We will obtain venous duplex to clarify

## 2018-08-07 NOTE — Telephone Encounter (Signed)
Attempted to call patient today regarding possible medication reaction. I did not receive an answer at time of call. I have left a voicemail message for pt to return call. X2

## 2018-08-07 NOTE — Progress Notes (Signed)
   Subjective:    Patient ID: Elizabeth Riggs, female    DOB: [birth year], 47 y.o.   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 She presented in 02/2015 with weight loss, intractable nausea and vomiting Had lap chole 03/12/15 and was told that she had a growth on her GB  Pathology showed non-caseating granulomas.   Her breathing has been mostly stable.  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 Has lost about 9 pounds since her last visit 6 months ago. She reports periodic hives that seemed to improve with Benadryl and wonders if this was related sarcoidosis.  She also complains of left calf pain She also reports dark brown lesions that come over her legs, nontender and leave behind scarring and wonders if this is related to sarcoidosis  Chest x-ray from 10/2017 showed increased interstitial prominence  Significant tests/ events  Chest x-ray 12/2014 showed right hilar prominence which was new compared to 05/2014 CT chest 03/2015 - mediastinal lymphadenopathy-subcarinal and precarinal, scattered subcentimeter nodules CT abdomen from 2010 -clear bases   PFTs 04/2015 -nml  Spirometry 10/2017  shows slightly decreased to stable lung function with FVC at 70%, FEV1 71%, ratio 83.  spirometry 07/2018 shows mild restriction with an FEV1 of 73% and FVC of 70%   Past Medical History:  Diagnosis Date  . Chronic pain of both knees   . Gallstones   . Hypertension   . Obesity   . Sarcoidosis   . Thyroid disease      Review of Systems neg for any significant sore throat, dysphagia, itching, sneezing, nasal congestion or excess/ purulent secretions, fever, chills, sweats, unintended wt loss, pleuritic or exertional cp, hempoptysis, orthopnea pnd or change in chronic leg swelling. Also denies presyncope, palpitations, heartburn, abdominal pain, nausea, vomiting, diarrhea or change in bowel or urinary habits, dysuria,hematuria, rash, arthralgias, visual complaints,  headache, numbness weakness or ataxia.     Objective:   Physical Exam  Gen. Pleasant, obese, in no distress ENT - no lesions, no post nasal drip Neck: No JVD, no thyromegaly, no carotid bruits Lungs: no use of accessory muscles, no dullness to percussion, decreased without rales or rhonchi  Cardiovascular: Rhythm regular, heart sounds  normal, no murmurs or gallops, no peripheral edema Musculoskeletal: No deformities, no cyanosis or clubbing , no tremors Left popliteal fossa tender       Assessment & Plan:

## 2018-08-07 NOTE — Assessment & Plan Note (Signed)
Appears stable lung function. We will obtain chest x-ray for completion but do not feel like she needs treatment. Leg lesions are not typical for sarcoidosis

## 2018-08-08 ENCOUNTER — Other Ambulatory Visit: Payer: Self-pay | Admitting: Pulmonary Disease

## 2018-08-08 DIAGNOSIS — R2242 Localized swelling, mass and lump, left lower limb: Secondary | ICD-10-CM

## 2018-08-08 NOTE — Progress Notes (Signed)
Spoke with pt and notified of results per Dr. Alva. Pt verbalized understanding and denied any questions. 

## 2018-08-08 NOTE — Telephone Encounter (Signed)
Called and spoke with pt regarding her medication reaction Pt advised she was seen by [facility] 08/07/2018 Nothing further needed.

## 2018-08-09 ENCOUNTER — Ambulatory Visit (HOSPITAL_COMMUNITY)
Admission: RE | Admit: 2018-08-09 | Discharge: 2018-08-09 | Disposition: A | Discharge status: Home / Self Care | Payer: BLUE CROSS/BLUE SHIELD | Source: Ambulatory Visit | Attending: Cardiology | Admitting: Cardiology

## 2018-08-09 DIAGNOSIS — R2242 Localized swelling, mass and lump, left lower limb: Secondary | ICD-10-CM | POA: Diagnosis not present

## 2018-08-10 NOTE — Progress Notes (Signed)
Spoke with pt and notified of results per Dr. Alva. Pt verbalized understanding and denied any questions. 

## 2018-08-14 DIAGNOSIS — M7661 Achilles tendinitis, right leg: Secondary | ICD-10-CM | POA: Diagnosis not present

## 2018-08-20 ENCOUNTER — Ambulatory Visit (INDEPENDENT_AMBULATORY_CARE_PROVIDER_SITE_OTHER): Payer: BLUE CROSS/BLUE SHIELD | Admitting: Family Medicine

## 2018-08-20 ENCOUNTER — Encounter: Payer: Self-pay | Admitting: Family Medicine

## 2018-08-20 VITALS — BP 158/89 | HR 88 | Ht 65.0 in | Wt 236.0 lb

## 2018-08-20 DIAGNOSIS — M7661 Achilles tendinitis, right leg: Secondary | ICD-10-CM | POA: Diagnosis not present

## 2018-08-20 MED ORDER — METHOCARBAMOL 500 MG PO TABS: 500.0000 mg | ORAL_TABLET | Freq: Three times a day (TID) | ORAL | 1 refills | Status: DC | PRN

## 2018-08-20 NOTE — Patient Instructions (Signed)
We will refer you to a surgeon given you haven't improved with conservative treatment. Continue home exercises, heel lifts. Consider custom orthotics, the special injections. Robaxin at nighttime to help with spasms, hopefully you can get some sleep.

## 2018-08-20 NOTE — Progress Notes (Signed)
PCP: Sinclair Ship, MD  Subjective:   HPI: Patient is a 47 y.o. female here for right heel pain.  3/14: Patient reports for about 1 week she has had fairly severe pain on the plantar and medial aspect of her right heel. She reports pain is sharp and worse by the end of the day. She has tried Aleve and rolling this on a can. No other treatment to date. No skin changes, numbness. Pain level 8 out of 10.  5/2: Patient returns with worsening pain more in posterior heel now. Pain level 9/10 and sharp. Worse with walking. She has been rolling bottom of her foot on a bottle, using arch binder but not wearing today. Not using inserts, doing home exercises otherwise. No skin changes.  5/23: Patient reports she's struggling with pain posterior right heel. She's wearing boot but noted she is not wearing this correctly. Has heel lift in this. Tried nitro patches. Pain level 9/10 and sharp, worse with walking. No skin changes.  7/15: Patient reports continued right heel pain which is worse at the end of the day. She states that it is relatively unchanged in location; pain is at the insertion of the achilles on the calcaneous. She has completed 4 weeks of physical and reports little improvement at this point. She does her home excises. She wears her cam boot on a rotating basis but is not wearing them at this time.  No skin changes.  8/26: Patient returns reporting continued pain posterior right heel. She recently tried medrol dose pack, using voltaren gel, doing home exercises and not getting benefit. Pain still high at 8/10 and sharp. Worse with walking still. Done using the cam walker with heel lifts. No skin changes, no new injuries.  Past Medical History:  Diagnosis Date  . Chronic pain of both knees   . Gallstones   . Hypertension   . Obesity   . Sarcoidosis   . Thyroid disease     Current Outpatient Medications on File Prior to Visit  Medication Sig Dispense Refill  .  methylPREDNISolone (MEDROL DOSEPAK) 4 MG TBPK tablet follow package directions    . diclofenac sodium (VOLTAREN) 1 % GEL Apply 2 g topically 4 (four) times daily. 3 Tube 2  . dicyclomine (BENTYL) 20 MG tablet Take 1 tablet (20 mg total) by mouth 2 (two) times daily. 20 tablet 0  . furosemide (LASIX) 20 MG tablet Take 1 tablet (20 mg total) by mouth daily. 30 tablet 5  . levothyroxine (SYNTHROID, LEVOTHROID) 125 MCG tablet Take 1 tablet (125 mcg total) by mouth daily. 30 tablet 0  . meloxicam (MOBIC) 15 MG tablet Take 1 tablet (15 mg total) by mouth daily. 30 tablet 2  . nitroGLYCERIN (NITRODUR - DOSED IN MG/24 HR) 0.2 mg/hr patch Apply 1/4th patch to affected achilles, change daily 30 patch 1  . omeprazole (PRILOSEC) 20 MG capsule Take 1 capsule (20 mg total) by mouth daily. 30 capsule 0  . phentermine (ADIPEX-P) 37.5 MG tablet Take 37.5 mg by mouth daily.  0  . potassium chloride (KLOR-CON 10) 10 MEQ tablet TAKE 1 TABLET BY MOUTH THREE TIMES A WEEK TAKE MEDICATION ON THE DAYS YOU TAKE YOUR FUROSEMIDE    . spironolactone (ALDACTONE) 50 MG tablet Take 50 mg by mouth daily.    . sucralfate (CARAFATE) 1 g tablet Take by mouth.    . triamterene-hydrochlorothiazide (DYAZIDE) 37.5-25 MG capsule Take by mouth.     No current facility-administered medications on file prior to  visit.     Past Surgical History:  Procedure Laterality Date  . ABDOMINAL HYSTERECTOMY    . CHOLECYSTECTOMY  03/12/15   [facility] in [place]  . KNEE SURGERY    . right knee surgery      Allergies  Allergen Reactions  . Morphine And Related Itching  . Lisinopril Other (See Comments) and Rash    headache    Social History   Socioeconomic History  . Marital status: Single    Spouse name: Not on file  . Number of children: Not on file  . Years of education: Not on file  . Highest education level: Not on file  Occupational History  . Not on file  Social Needs  . Financial resource strain: Not on  file  . Food insecurity:    Worry: Not on file    Inability: Not on file  . Transportation needs:    Medical: Not on file    Non-medical: Not on file  Tobacco Use  . Smoking status: Never Smoker  . Smokeless tobacco: Never Used  Substance and Sexual Activity  . Alcohol use: No    Alcohol/week: 0.0 standard drinks  . Drug use: No  . Sexual activity: Not on file  Lifestyle  . Physical activity:    Days per week: Not on file    Minutes per session: Not on file  . Stress: Not on file  Relationships  . Social connections:    Talks on phone: Not on file    Gets together: Not on file    Attends religious service: Not on file    Active member of club or organization: Not on file    Attends meetings of clubs or organizations: Not on file    Relationship status: Not on file  . Intimate partner violence:    Fear of current or ex partner: Not on file    Emotionally abused: Not on file    Physically abused: Not on file    Forced sexual activity: Not on file  Other Topics Concern  . Not on file  Social History Narrative   She works as a Quarry manager at [employer]   3 children- all live locally, she has 3 grand children.   66- son   60- son   67- son   Completed 12th grade   Enjoys church, spending time with grand children.    Family History  Problem Relation Age of Onset  . Hypertension Mother   . Diabetes Father   . Hypertension Father   . Cancer Maternal Grandmother        breast  . Cancer Maternal Grandfather        prostate  . Heart attack Neg Hx     BP (!) 158/89   Pulse 88   Ht 5\' 5"  (1.651 m)   Wt 236 lb (107 kg)   BMI 39.27 kg/m   Review of Systems: No swelling. No bruising. No overlying skin changes.     Objective:  Physical Exam:  Gen: NAD, comfortable in exam room  Right foot/ankle: Pes planus. No gross deformity, swelling, ecchymoses FROM with 5/5 strength all motions. TTP achilles at insertion, less post tib and peroneal  tendons. Negative ant drawer and talar tilt.   Negative calcaneal squeeze.  Thompsons test negative. NV intact distally.   Assessment & Plan:  1. Right achilles tendinopathy - unfortunately not improved to date with physical therapy, home exercise program, voltaren gel,  icing, ibuprofen, medrol dose pack.  Discussed options including custom orthotics, PRP, surgeon referral and she would like to go ahead with referral.  Ibuprofen as needed, continue home exercises, heel lifts in meantime.

## 2018-09-11 DIAGNOSIS — R112 Nausea with vomiting, unspecified: Secondary | ICD-10-CM | POA: Diagnosis not present

## 2018-09-11 DIAGNOSIS — R1010 Upper abdominal pain, unspecified: Secondary | ICD-10-CM | POA: Diagnosis not present

## 2018-09-11 DIAGNOSIS — Z791 Long term (current) use of non-steroidal anti-inflammatories (NSAID): Secondary | ICD-10-CM | POA: Diagnosis not present

## 2018-09-11 DIAGNOSIS — R1310 Dysphagia, unspecified: Secondary | ICD-10-CM | POA: Diagnosis not present

## 2018-09-19 DIAGNOSIS — M7661 Achilles tendinitis, right leg: Secondary | ICD-10-CM | POA: Diagnosis not present

## 2018-09-19 DIAGNOSIS — M6701 Short Achilles tendon (acquired), right ankle: Secondary | ICD-10-CM | POA: Diagnosis not present

## 2018-09-19 DIAGNOSIS — M25571 Pain in right ankle and joints of right foot: Secondary | ICD-10-CM | POA: Diagnosis not present

## 2018-09-20 DIAGNOSIS — K30 Functional dyspepsia: Secondary | ICD-10-CM | POA: Diagnosis not present

## 2018-09-20 DIAGNOSIS — R1010 Upper abdominal pain, unspecified: Secondary | ICD-10-CM | POA: Diagnosis not present

## 2018-09-20 DIAGNOSIS — R112 Nausea with vomiting, unspecified: Secondary | ICD-10-CM | POA: Diagnosis not present

## 2018-09-27 DIAGNOSIS — R112 Nausea with vomiting, unspecified: Secondary | ICD-10-CM | POA: Diagnosis not present

## 2018-09-27 DIAGNOSIS — R1319 Other dysphagia: Secondary | ICD-10-CM | POA: Diagnosis not present

## 2018-09-27 DIAGNOSIS — R1013 Epigastric pain: Secondary | ICD-10-CM | POA: Diagnosis not present

## 2018-09-28 DIAGNOSIS — M19071 Primary osteoarthritis, right ankle and foot: Secondary | ICD-10-CM | POA: Diagnosis not present

## 2018-10-08 DIAGNOSIS — M7661 Achilles tendinitis, right leg: Secondary | ICD-10-CM | POA: Diagnosis not present

## 2018-10-08 DIAGNOSIS — M898X7 Other specified disorders of bone, ankle and foot: Secondary | ICD-10-CM | POA: Diagnosis not present

## 2018-10-08 DIAGNOSIS — M6701 Short Achilles tendon (acquired), right ankle: Secondary | ICD-10-CM | POA: Diagnosis not present

## 2018-10-09 ENCOUNTER — Telehealth: Payer: Self-pay | Admitting: Pulmonary Disease

## 2018-10-09 NOTE — Telephone Encounter (Signed)
Okay to clear with this, please leave in my look at

## 2018-10-09 NOTE — Telephone Encounter (Signed)
Received a surgical clearance from [facility] for patient. Her procedure is listed as a right Achilles tendon debridement, excision of Haglund, gastroc recession.   Patient was last seen by you on 08/07/18. Surgery was not discussed during this visit. She was advised to follow up in 6 months.   RA, please advise if you are ok with clearing for surgery or if she will need another OV before surgery. Thanks!

## 2018-10-12 NOTE — Telephone Encounter (Signed)
Received the signed form back. Will fax the form back to [facility] and close this encounter.

## 2018-11-04 ENCOUNTER — Other Ambulatory Visit: Payer: Self-pay | Admitting: Family Medicine

## 2018-11-08 ENCOUNTER — Other Ambulatory Visit (HOSPITAL_COMMUNITY): Payer: Self-pay | Admitting: Orthopedic Surgery

## 2018-11-29 DIAGNOSIS — M7661 Achilles tendinitis, right leg: Secondary | ICD-10-CM | POA: Diagnosis not present

## 2018-12-01 ENCOUNTER — Encounter (HOSPITAL_BASED_OUTPATIENT_CLINIC_OR_DEPARTMENT_OTHER): Payer: Self-pay | Admitting: *Deleted

## 2018-12-01 ENCOUNTER — Other Ambulatory Visit: Payer: Self-pay

## 2018-12-01 ENCOUNTER — Emergency Department (HOSPITAL_BASED_OUTPATIENT_CLINIC_OR_DEPARTMENT_OTHER)
Admission: EM | Admit: 2018-12-01 | Discharge: 2018-12-01 | Disposition: A | Discharge status: Home / Self Care | Payer: BLUE CROSS/BLUE SHIELD | Attending: Emergency Medicine | Admitting: Emergency Medicine

## 2018-12-01 DIAGNOSIS — E039 Hypothyroidism, unspecified: Secondary | ICD-10-CM | POA: Insufficient documentation

## 2018-12-01 DIAGNOSIS — M5442 Lumbago with sciatica, left side: Secondary | ICD-10-CM | POA: Diagnosis not present

## 2018-12-01 DIAGNOSIS — Z79899 Other long term (current) drug therapy: Secondary | ICD-10-CM | POA: Insufficient documentation

## 2018-12-01 DIAGNOSIS — M545 Low back pain: Secondary | ICD-10-CM | POA: Diagnosis not present

## 2018-12-01 DIAGNOSIS — I10 Essential (primary) hypertension: Secondary | ICD-10-CM | POA: Diagnosis not present

## 2018-12-01 LAB — URINALYSIS, ROUTINE W REFLEX MICROSCOPIC
BILIRUBIN URINE: NEGATIVE
GLUCOSE, UA: NEGATIVE mg/dL
HGB URINE DIPSTICK: NEGATIVE
Ketones, ur: NEGATIVE mg/dL
Nitrite: NEGATIVE
PROTEIN: NEGATIVE mg/dL
Specific Gravity, Urine: 1.02 (ref 1.005–1.030)
pH: 6 (ref 5.0–8.0)

## 2018-12-01 LAB — URINALYSIS, MICROSCOPIC (REFLEX): RBC / HPF: NONE SEEN RBC/hpf (ref 0–5)

## 2018-12-01 MED ORDER — PREDNISONE 50 MG PO TABS
60.0000 mg | ORAL_TABLET | Freq: Once | ORAL | Status: AC
  Administered 2018-12-01: 60 mg via ORAL
  Filled 2018-12-01: qty 1

## 2018-12-01 MED ORDER — PREDNISONE 10 MG (21) PO TBPK: ORAL_TABLET | Freq: Every day | ORAL | 0 refills | Status: DC

## 2018-12-01 MED ORDER — LIDOCAINE 5 % EX PTCH: 1.0000 | MEDICATED_PATCH | CUTANEOUS | 0 refills | Status: DC

## 2018-12-01 NOTE — ED Provider Notes (Signed)
[facility] EMERGENCY DEPARTMENT Provider Note   CSN: [id number] Arrival date & time: 12/01/18  1848     History   Chief Complaint Chief Complaint  Patient presents with  . Back Pain    HPI Elizabeth Riggs is a 47 y.o. female with a hx of HTN, obesity, sarcoidosis, and hypothyroidism who presents to the ED with complaints of left lower back pain x 3 days. Pain is in the left lower back/gluteal area and radiates down the leg. Pain is constant, worse with certain movements/position changes. No alleviating factors. Tried left over robaxin and OTC lidocaine patches. She did have pain develop day following heavy lifting of a patient. No traumatic fall/injury. Denies numbness, tingling, weakness, saddle anesthesia, incontinence to bowel/bladder, fever, chills, IV drug use, dysuria, or hx of cancer. Patient has not had prior back surgeries.    HPI  Past Medical History:  Diagnosis Date  . Chronic pain of both knees   . Gallstones   . Hypertension   . Obesity   . Sarcoidosis   . Thyroid disease     Patient Active Problem List   Diagnosis Date Noted  . Pain of left calf 08/07/2018  . Achilles tendinitis of right lower extremity 04/27/2018  . Plantar fasciitis of right foot 03/09/2018  . Bilateral ankle pain 09/06/2017  . Varicose veins of leg with swelling, bilateral 07/27/2017  . Venous insufficiency of both lower extremities 07/04/2017  . Right shoulder pain 03/03/2017  . Macular dystrophy 01/12/2017  . Left elbow pain 11/09/2016  . Class 3 obesity due to excess calories without serious comorbidity with body mass index (BMI) of 40.0 to 44.9 in adult 11/02/2016  . GERD (gastroesophageal reflux disease) 07/10/2015  . Geographic tongue 04/28/2015  . Oral thrush 04/28/2015  . Sarcoidosis 04/09/2015  . Hyperglycemia 04/08/2015  . Hypokalemia 03/31/2015  . Constipation 03/19/2015  . Abdominal pain, epigastric 02/18/2015  . Hypothyroidism 02/18/2015  . HTN  (hypertension) 02/18/2015  . Bilateral knee pain 05/12/2011    Past Surgical History:  Procedure Laterality Date  . ABDOMINAL HYSTERECTOMY    . CHOLECYSTECTOMY  03/12/15   [facility] in [place]  . KNEE SURGERY    . right knee surgery       OB History   None      Home Medications    Prior to Admission medications   Medication Sig Start Date End Date Taking? Authorizing Provider  diclofenac sodium (VOLTAREN) 1 % GEL Apply 2 g topically 4 (four) times daily. 07/09/18   Hudnall, Sharyn Lull, MD  dicyclomine (BENTYL) 20 MG tablet Take 1 tablet (20 mg total) by mouth 2 (two) times daily. 03/09/17   Malvin Johns, MD  furosemide (LASIX) 20 MG tablet Take 1 tablet (20 mg total) by mouth daily. 02/13/18   Parrett, Fonnie Mu, NP  levothyroxine (SYNTHROID, LEVOTHROID) 125 MCG tablet Take 1 tablet (125 mcg total) by mouth daily. 03/23/17   Parrett, Fonnie Mu, NP  meloxicam (MOBIC) 15 MG tablet Take 1 tablet (15 mg total) by mouth daily. 03/08/18 03/08/19  Hudnall, Sharyn Lull, MD  methocarbamol (ROBAXIN) 500 MG tablet TAKE 1 TABLET (500 MG TOTAL) BY MOUTH EVERY 8 (EIGHT) HOURS AS NEEDED. 11/05/18   Hudnall, Sharyn Lull, MD  methylPREDNISolone (MEDROL DOSEPAK) 4 MG TBPK tablet follow package directions 08/14/18   [provider]  nitroGLYCERIN (NITRODUR - DOSED IN MG/24 HR) 0.2 mg/hr patch Apply 1/4th patch to affected achilles, change daily 04/26/18   Hudnall, Sharyn Lull, MD  omeprazole (PRILOSEC) 20 MG capsule Take 1 capsule (20 mg total) by mouth daily. 03/09/17   Malvin Johns, MD  phentermine (ADIPEX-P) 37.5 MG tablet Take 37.5 mg by mouth daily. 08/14/18   [provider]  potassium chloride (KLOR-CON 10) 10 MEQ tablet TAKE 1 TABLET BY MOUTH THREE TIMES A WEEK TAKE MEDICATION ON THE DAYS YOU TAKE YOUR FUROSEMIDE 03/14/18   [provider]  spironolactone (ALDACTONE) 50 MG tablet Take 50 mg by mouth daily.    [provider]  sucralfate (CARAFATE) 1 g tablet Take by  mouth. 02/16/18   [provider]  triamterene-hydrochlorothiazide (DYAZIDE) 37.5-25 MG capsule Take by mouth. 03/02/15   [provider]    Family History Family History  Problem Relation Age of Onset  . Hypertension Mother   . Diabetes Father   . Hypertension Father   . Cancer Maternal Grandmother        breast  . Cancer Maternal Grandfather        prostate  . Heart attack Neg Hx     Social History Social History   Tobacco Use  . Smoking status: Never Smoker  . Smokeless tobacco: Never Used  Substance Use Topics  . Alcohol use: No    Alcohol/week: 0.0 standard drinks  . Drug use: No     Allergies   Morphine and related and Lisinopril   Review of Systems Review of Systems  Respiratory: Negative for shortness of breath.   Cardiovascular: Negative for chest pain.  Gastrointestinal: Negative for abdominal pain and vomiting.  Genitourinary: Negative for dysuria and hematuria.  Musculoskeletal: Positive for back pain.  Neurological: Negative for weakness and numbness.       Negative for incontinence or saddle anesthesia.      Physical Exam Updated Vital Signs BP (!) 141/79 (BP Location: Right Arm)   Pulse 72   Temp 98.2 F (36.8 C) (Oral)   Resp 18   Ht 5\' 5"  (1.651 m)   Wt 111.1 kg   SpO2 97%   BMI 40.77 kg/m   Physical Exam  Constitutional: She appears well-developed and well-nourished.  Non-toxic appearance. No distress.  HENT:  Head: Normocephalic and atraumatic.  Eyes: Conjunctivae are normal. Right eye exhibits no discharge. Left eye exhibits no discharge.  Neck: Neck supple.  Cardiovascular: Normal rate and regular rhythm.  Pulmonary/Chest: Effort normal and breath sounds normal. No respiratory distress. She has no wheezes. She has no rhonchi. She has no rales.  Respiration even and unlabored  Abdominal: Soft. She exhibits no distension. There is no tenderness.  Musculoskeletal:  No obvious deformity, appreciable swelling,  erythema, ecchymosis, or open wounds. No rashes Back: No midline tenderness or palpable step off. Left lumbar paraspinal muscle tenderness which extends to the gluteus.  Lower extremities: full AROM to the hips, knees, and ankles. No point/focal bony tenderness.   Neurological: She is alert.  Clear speech. Sensation grossly intact to bilateral lower extremities. 5/5 strength with plantar/dorsiflexion. 2+ symmetric patellar DTRs. Ambulatory   Skin: Skin is warm and dry. No rash noted.  Psychiatric: She has a normal mood and affect. Her behavior is normal.  Nursing note and vitals reviewed.    ED Treatments / Results  Labs (all labs ordered are listed, but only abnormal results are displayed) Labs Reviewed  URINALYSIS, ROUTINE W REFLEX MICROSCOPIC - Abnormal; Notable for the following components:      Result Value   Color, Urine STRAW (*)    Leukocytes, UA SMALL (*)  All other components within normal limits  URINALYSIS, MICROSCOPIC (REFLEX) - Abnormal; Notable for the following components:   Bacteria, UA FEW (*)    All other components within normal limits    EKG None  Radiology No results found.  Procedures Procedures (including critical care time)  Medications Ordered in ED Medications - No data to display   Initial Impression / Assessment and Plan / ED Course  I have reviewed the triage vital signs and the nursing notes.  Pertinent labs & imaging results that were available during my care of the patient were reviewed by me and considered in my medical decision making (see chart for details).    Patient presents with complaint of back pain.  Patient is nontoxic appearing, vitals are WNL other than elevated BP- doubt HTN emergency, PCP recheck. Patient has normal neurologic exam, no point/focal midline tenderness to palpation. She is ambulatory in the ED.  No back pain red flags. No urinary sxs- UA by triage with small leuks, few bacteria- doubt UTI. Most likely muscle  strain versus spasm w/ component of sciatica given radiating down the leg. Considered disc disease, UTI/pyelonephritis, kidney stone, aortic aneurysm/dissection, cauda equina or epidural abscess however these do not fit clinical picture at this time. Will treat with prednisone taper, continue robaxin at home, discussed with patient that they are not to drive or operate heavy machinery while taking Robaxin. I discussed treatment plan, need for PCP follow-up, and return precautions with the patient. Provided opportunity for questions, patient confirmed understanding and is in agreement with plan.   Final Clinical Impressions(s) / ED Diagnoses   Final diagnoses:  Acute left-sided low back pain with left-sided sciatica    ED Discharge Orders         Ordered    predniSONE (STERAPRED UNI-PAK 21 TAB) 10 MG (21) TBPK tablet  Daily     12/01/18 2146    lidocaine (LIDODERM) 5 %  Every 24 hours     12/01/18 2146           Amaryllis Dyke, PA-C 12/01/18 2149    Charlesetta Shanks, MD 12/06/18 [phone]

## 2018-12-01 NOTE — ED Triage Notes (Signed)
Pt reports left side low back pain since yesterday. States it may be related to lifting a patient at work. Using lidoderm patches with some relief

## 2018-12-01 NOTE — ED Notes (Signed)
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

## 2018-12-01 NOTE — Discharge Instructions (Addendum)
You were seen in the emergency department for back pain today.  At this time we suspect that your pain is related to a muscle strain/spasm with a component of sciatica.   I have prescribed you an anti-inflammatory medication and a muscle relaxer.  - Prednisone- this is a steroid to help with inflammation - Lidoderm patches- these are topical patches, place one over the buttocks daily.   You make take Tylenol per over the counter dosing with these medications. Please continue your Robaxin as prescribed- do not drive or operate heavy machinery with this medicine.   We have prescribed you new medication(s) today. Discuss the medications prescribed today with your pharmacist as they can have adverse effects and interactions with your other medicines including over the counter and prescribed medications. Seek medical evaluation if you start to experience new or abnormal symptoms after taking one of these medicines, seek care immediately if you start to experience difficulty breathing, feeling of your throat closing, facial swelling, or rash as these could be indications of a more serious allergic reaction   The application of heat can help soothe the pain.  Maintaining your daily activities, including walking, is encourged, as it will help you get better faster than just staying in bed.  Your pain should get better over the next 2 weeks.  You will need to follow up with  Your primary healthcare provider or Dr. Barbaraann Barthel in 1-2 weeks for reassessment, if you do not have a primary care provider one is provided in your discharge instructions- you may see the [facility] or call the provided phone number. However return to the ER should you develop ne or worsening symptoms or any other concerns including but not limited to severe or worsening pain, low back pain with fever, numbness, weakness, loss of bowel or bladder control, or inability to walk or urinate, you should return to the ER immediately.

## 2018-12-27 DIAGNOSIS — K219 Gastro-esophageal reflux disease without esophagitis: Secondary | ICD-10-CM | POA: Diagnosis not present

## 2018-12-27 DIAGNOSIS — K3184 Gastroparesis: Secondary | ICD-10-CM | POA: Diagnosis not present

## 2019-01-01 ENCOUNTER — Encounter: Payer: Self-pay | Admitting: Adult Health

## 2019-01-03 ENCOUNTER — Encounter (HOSPITAL_BASED_OUTPATIENT_CLINIC_OR_DEPARTMENT_OTHER): Payer: Self-pay | Admitting: *Deleted

## 2019-01-03 ENCOUNTER — Other Ambulatory Visit: Payer: Self-pay

## 2019-01-08 DIAGNOSIS — M6701 Short Achilles tendon (acquired), right ankle: Secondary | ICD-10-CM | POA: Diagnosis not present

## 2019-01-09 DIAGNOSIS — Z713 Dietary counseling and surveillance: Secondary | ICD-10-CM | POA: Diagnosis not present

## 2019-01-10 ENCOUNTER — Encounter (HOSPITAL_BASED_OUTPATIENT_CLINIC_OR_DEPARTMENT_OTHER)
Admission: RE | Disposition: A | Discharge status: Home / Self Care | Payer: Self-pay | Source: Home / Self Care | Attending: Orthopedic Surgery

## 2019-01-10 ENCOUNTER — Ambulatory Visit (HOSPITAL_BASED_OUTPATIENT_CLINIC_OR_DEPARTMENT_OTHER): Payer: BLUE CROSS/BLUE SHIELD | Admitting: Anesthesiology

## 2019-01-10 ENCOUNTER — Encounter (HOSPITAL_BASED_OUTPATIENT_CLINIC_OR_DEPARTMENT_OTHER): Payer: Self-pay | Admitting: Certified Registered"

## 2019-01-10 ENCOUNTER — Other Ambulatory Visit: Payer: Self-pay

## 2019-01-10 ENCOUNTER — Ambulatory Visit (HOSPITAL_BASED_OUTPATIENT_CLINIC_OR_DEPARTMENT_OTHER)
Admission: RE | Admit: 2019-01-10 | Discharge: 2019-01-10 | Disposition: A | Discharge status: Home / Self Care | Payer: BLUE CROSS/BLUE SHIELD | Attending: Orthopedic Surgery | Admitting: Orthopedic Surgery

## 2019-01-10 DIAGNOSIS — Z885 Allergy status to narcotic agent status: Secondary | ICD-10-CM | POA: Insufficient documentation

## 2019-01-10 DIAGNOSIS — Z90710 Acquired absence of both cervix and uterus: Secondary | ICD-10-CM | POA: Insufficient documentation

## 2019-01-10 DIAGNOSIS — M6701 Short Achilles tendon (acquired), right ankle: Secondary | ICD-10-CM | POA: Insufficient documentation

## 2019-01-10 DIAGNOSIS — Z8249 Family history of ischemic heart disease and other diseases of the circulatory system: Secondary | ICD-10-CM | POA: Insufficient documentation

## 2019-01-10 DIAGNOSIS — M7661 Achilles tendinitis, right leg: Secondary | ICD-10-CM | POA: Insufficient documentation

## 2019-01-10 DIAGNOSIS — G8929 Other chronic pain: Secondary | ICD-10-CM | POA: Diagnosis not present

## 2019-01-10 DIAGNOSIS — M9261 Juvenile osteochondrosis of tarsus, right ankle: Secondary | ICD-10-CM | POA: Insufficient documentation

## 2019-01-10 DIAGNOSIS — E669 Obesity, unspecified: Secondary | ICD-10-CM | POA: Diagnosis not present

## 2019-01-10 DIAGNOSIS — Z9049 Acquired absence of other specified parts of digestive tract: Secondary | ICD-10-CM | POA: Insufficient documentation

## 2019-01-10 DIAGNOSIS — Z803 Family history of malignant neoplasm of breast: Secondary | ICD-10-CM | POA: Insufficient documentation

## 2019-01-10 DIAGNOSIS — E039 Hypothyroidism, unspecified: Secondary | ICD-10-CM | POA: Diagnosis not present

## 2019-01-10 DIAGNOSIS — Z8042 Family history of malignant neoplasm of prostate: Secondary | ICD-10-CM | POA: Diagnosis not present

## 2019-01-10 DIAGNOSIS — Z6841 Body Mass Index (BMI) 40.0 and over, adult: Secondary | ICD-10-CM | POA: Insufficient documentation

## 2019-01-10 DIAGNOSIS — Z833 Family history of diabetes mellitus: Secondary | ICD-10-CM | POA: Insufficient documentation

## 2019-01-10 DIAGNOSIS — I10 Essential (primary) hypertension: Secondary | ICD-10-CM | POA: Diagnosis not present

## 2019-01-10 DIAGNOSIS — M62461 Contracture of muscle, right lower leg: Secondary | ICD-10-CM | POA: Diagnosis not present

## 2019-01-10 DIAGNOSIS — Z791 Long term (current) use of non-steroidal anti-inflammatories (NSAID): Secondary | ICD-10-CM | POA: Insufficient documentation

## 2019-01-10 DIAGNOSIS — D869 Sarcoidosis, unspecified: Secondary | ICD-10-CM | POA: Diagnosis not present

## 2019-01-10 DIAGNOSIS — Z79899 Other long term (current) drug therapy: Secondary | ICD-10-CM | POA: Insufficient documentation

## 2019-01-10 DIAGNOSIS — Z888 Allergy status to other drugs, medicaments and biological substances status: Secondary | ICD-10-CM | POA: Diagnosis not present

## 2019-01-10 DIAGNOSIS — M7731 Calcaneal spur, right foot: Secondary | ICD-10-CM | POA: Diagnosis not present

## 2019-01-10 DIAGNOSIS — K219 Gastro-esophageal reflux disease without esophagitis: Secondary | ICD-10-CM | POA: Diagnosis not present

## 2019-01-10 DIAGNOSIS — G8918 Other acute postprocedural pain: Secondary | ICD-10-CM | POA: Diagnosis not present

## 2019-01-10 HISTORY — DX: Gastro-esophageal reflux disease without esophagitis: K21.9

## 2019-01-10 HISTORY — PX: GASTROC RECESSION EXTREMITY: SHX6262

## 2019-01-10 HISTORY — PX: EXCISION HAGLUND'S DEFORMITY WITH ACHILLES TENDON REPAIR: SHX5627

## 2019-01-10 SURGERY — EXCISION HAGLUND'S DEFORMITY WITH ACHILLES TENDON REPAIR
Anesthesia: General | Site: Leg Lower | Laterality: Right

## 2019-01-10 MED ORDER — 0.9 % SODIUM CHLORIDE (POUR BTL) OPTIME
TOPICAL | Status: DC | PRN
  Administered 2019-01-10: 300 mL

## 2019-01-10 MED ORDER — SODIUM CHLORIDE 0.9 % IV SOLN: INTRAVENOUS | Status: DC

## 2019-01-10 MED ORDER — FENTANYL CITRATE (PF) 100 MCG/2ML IJ SOLN
50.0000 ug | INTRAMUSCULAR | Status: DC | PRN
  Administered 2019-01-10: 100 ug via INTRAVENOUS

## 2019-01-10 MED ORDER — PROPOFOL 10 MG/ML IV BOLUS
INTRAVENOUS | Status: DC | PRN
  Administered 2019-01-10: 200 mg via INTRAVENOUS

## 2019-01-10 MED ORDER — MIDAZOLAM HCL 2 MG/2ML IJ SOLN
1.0000 mg | INTRAMUSCULAR | Status: DC | PRN
  Administered 2019-01-10: 2 mg via INTRAVENOUS

## 2019-01-10 MED ORDER — LACTATED RINGERS IV SOLN
INTRAVENOUS | Status: DC
  Administered 2019-01-10 (×2): via INTRAVENOUS

## 2019-01-10 MED ORDER — DEXAMETHASONE SODIUM PHOSPHATE 4 MG/ML IJ SOLN
INTRAMUSCULAR | Status: DC | PRN
  Administered 2019-01-10: 10 mg via INTRAVENOUS

## 2019-01-10 MED ORDER — OXYCODONE HCL 5 MG PO TABS: 5.0000 mg | ORAL_TABLET | Freq: Once | ORAL | Status: DC | PRN

## 2019-01-10 MED ORDER — PROPOFOL 500 MG/50ML IV EMUL
INTRAVENOUS | Status: DC | PRN
  Administered 2019-01-10: 25 ug/kg/min via INTRAVENOUS

## 2019-01-10 MED ORDER — MEPERIDINE HCL 25 MG/ML IJ SOLN: 6.2500 mg | INTRAMUSCULAR | Status: DC | PRN

## 2019-01-10 MED ORDER — ROPIVACAINE HCL 5 MG/ML IJ SOLN
INTRAMUSCULAR | Status: DC | PRN
  Administered 2019-01-10: 30 mL via PERINEURAL

## 2019-01-10 MED ORDER — FENTANYL CITRATE (PF) 100 MCG/2ML IJ SOLN
INTRAMUSCULAR | Status: AC
  Filled 2019-01-10: qty 2

## 2019-01-10 MED ORDER — ASPIRIN EC 81 MG PO TBEC: 81.0000 mg | DELAYED_RELEASE_TABLET | Freq: Two times a day (BID) | ORAL | 0 refills | Status: DC

## 2019-01-10 MED ORDER — OXYCODONE HCL 5 MG PO TABS: 5.0000 mg | ORAL_TABLET | ORAL | 0 refills | Status: AC | PRN

## 2019-01-10 MED ORDER — CEFAZOLIN SODIUM-DEXTROSE 2-4 GM/100ML-% IV SOLN
INTRAVENOUS | Status: AC
  Filled 2019-01-10: qty 100

## 2019-01-10 MED ORDER — PROMETHAZINE HCL 25 MG/ML IJ SOLN: 6.2500 mg | INTRAMUSCULAR | Status: DC | PRN

## 2019-01-10 MED ORDER — ROCURONIUM BROMIDE 100 MG/10ML IV SOLN
INTRAVENOUS | Status: DC | PRN
  Administered 2019-01-10: 20 mg via INTRAVENOUS

## 2019-01-10 MED ORDER — BUPIVACAINE-EPINEPHRINE (PF) 0.25% -1:200000 IJ SOLN
INTRAMUSCULAR | Status: AC
  Filled 2019-01-10: qty 30

## 2019-01-10 MED ORDER — SCOPOLAMINE 1 MG/3DAYS TD PT72: 1.0000 | MEDICATED_PATCH | Freq: Once | TRANSDERMAL | Status: DC | PRN

## 2019-01-10 MED ORDER — SUGAMMADEX SODIUM 200 MG/2ML IV SOLN
INTRAVENOUS | Status: DC | PRN
  Administered 2019-01-10: 200 mg via INTRAVENOUS

## 2019-01-10 MED ORDER — ONDANSETRON HCL 4 MG/2ML IJ SOLN
INTRAMUSCULAR | Status: DC | PRN
  Administered 2019-01-10: 4 mg via INTRAVENOUS

## 2019-01-10 MED ORDER — OXYCODONE HCL 5 MG/5ML PO SOLN: 5.0000 mg | Freq: Once | ORAL | Status: DC | PRN

## 2019-01-10 MED ORDER — MIDAZOLAM HCL 2 MG/2ML IJ SOLN
INTRAMUSCULAR | Status: AC
  Filled 2019-01-10: qty 2

## 2019-01-10 MED ORDER — CHLORHEXIDINE GLUCONATE 4 % EX LIQD: 60.0000 mL | Freq: Once | CUTANEOUS | Status: DC

## 2019-01-10 MED ORDER — HYDROMORPHONE HCL 1 MG/ML IJ SOLN: 0.2500 mg | INTRAMUSCULAR | Status: DC | PRN

## 2019-01-10 MED ORDER — LIDOCAINE HCL (CARDIAC) PF 100 MG/5ML IV SOSY
PREFILLED_SYRINGE | INTRAVENOUS | Status: DC | PRN
  Administered 2019-01-10: 30 mg via INTRAVENOUS

## 2019-01-10 MED ORDER — DOCUSATE SODIUM 100 MG PO CAPS: 100.0000 mg | ORAL_CAPSULE | Freq: Two times a day (BID) | ORAL | 0 refills | Status: DC

## 2019-01-10 MED ORDER — CEFAZOLIN SODIUM-DEXTROSE 2-4 GM/100ML-% IV SOLN
2.0000 g | INTRAVENOUS | Status: AC
  Administered 2019-01-10: 2 g via INTRAVENOUS

## 2019-01-10 MED ORDER — SENNA 8.6 MG PO TABS: 2.0000 | ORAL_TABLET | Freq: Two times a day (BID) | ORAL | 0 refills | Status: DC

## 2019-01-10 SURGICAL SUPPLY — 77 items
ANCHOR JUGGERKNOT WTAP NDL 2.9 (Anchor) ×4 IMPLANT
ANCHOR KNTLS VENTIX 4.75 (Anchor) ×4 IMPLANT
BANDAGE ESMARK 6X9 LF (GAUZE/BANDAGES/DRESSINGS) ×2 IMPLANT
BIT DRILL JUGRKNT W/NDL BIT2.9 (DRILL) IMPLANT
BLADE AVERAGE 25MMX9MM (BLADE)
BLADE AVERAGE 25X9 (BLADE) IMPLANT
BLADE MICRO SAGITTAL (BLADE) ×2 IMPLANT
BLADE SURG 15 STRL LF DISP TIS (BLADE) ×4 IMPLANT
BLADE SURG 15 STRL SS (BLADE) ×8
BNDG COHESIVE 4X5 TAN STRL (GAUZE/BANDAGES/DRESSINGS) ×4 IMPLANT
BNDG COHESIVE 6X5 TAN STRL LF (GAUZE/BANDAGES/DRESSINGS) ×4 IMPLANT
BNDG ESMARK 6X9 LF (GAUZE/BANDAGES/DRESSINGS) ×4
BOOT STEPPER DURA LG (SOFTGOODS) IMPLANT
BOOT STEPPER DURA MED (SOFTGOODS) IMPLANT
CANISTER SUCT 1200ML W/VALVE (MISCELLANEOUS) ×2 IMPLANT
CHLORAPREP W/TINT 26ML (MISCELLANEOUS) ×4 IMPLANT
COVER BACK TABLE 60X90IN (DRAPES) ×4 IMPLANT
COVER WAND RF STERILE (DRAPES) IMPLANT
CUFF TOURNIQUET SINGLE 34IN LL (TOURNIQUET CUFF) ×4 IMPLANT
CUFF TOURNIQUET SINGLE 44IN (TOURNIQUET CUFF) ×2 IMPLANT
DRAPE EXTREMITY T 121X128X90 (DISPOSABLE) ×4 IMPLANT
DRAPE OEC MINIVIEW 54X84 (DRAPES) IMPLANT
DRAPE U-SHAPE 47X51 STRL (DRAPES) ×4 IMPLANT
DRILL JUGGERKNOT W/NDL BIT 2.9 (DRILL) ×4
DRSG MEPITEL 4X7.2 (GAUZE/BANDAGES/DRESSINGS) ×4 IMPLANT
DRSG PAD ABDOMINAL 8X10 ST (GAUZE/BANDAGES/DRESSINGS) ×8 IMPLANT
ELECT REM PT RETURN 9FT ADLT (ELECTROSURGICAL) ×4
ELECTRODE REM PT RTRN 9FT ADLT (ELECTROSURGICAL) ×2 IMPLANT
GAUZE SPONGE 4X4 12PLY STRL (GAUZE/BANDAGES/DRESSINGS) ×4 IMPLANT
GLOVE BIO SURGEON STRL SZ8 (GLOVE) ×4 IMPLANT
GLOVE BIOGEL PI IND STRL 8 (GLOVE) ×4 IMPLANT
GLOVE BIOGEL PI INDICATOR 8 (GLOVE) ×4
GLOVE ECLIPSE 8.0 STRL XLNG CF (GLOVE) ×4 IMPLANT
GOWN STRL REUS W/ TWL LRG LVL3 (GOWN DISPOSABLE) ×2 IMPLANT
GOWN STRL REUS W/ TWL XL LVL3 (GOWN DISPOSABLE) ×4 IMPLANT
GOWN STRL REUS W/TWL LRG LVL3 (GOWN DISPOSABLE) ×2
GOWN STRL REUS W/TWL XL LVL3 (GOWN DISPOSABLE) ×4
KIT BIO-TENODESIS 3X8 DISP (MISCELLANEOUS)
KIT INSRT BABSR STRL DISP BTN (MISCELLANEOUS) IMPLANT
NDL SAFETY ECLIPSE 18X1.5 (NEEDLE) IMPLANT
NDL SUT 6 .5 CRC .975X.05 MAYO (NEEDLE) IMPLANT
NEEDLE HYPO 18GX1.5 SHARP (NEEDLE)
NEEDLE HYPO 22GX1.5 SAFETY (NEEDLE) IMPLANT
NEEDLE MAYO TAPER (NEEDLE)
NS IRRIG 1000ML POUR BTL (IV SOLUTION) ×4 IMPLANT
PACK BASIN DAY SURGERY FS (CUSTOM PROCEDURE TRAY) ×4 IMPLANT
PAD CAST 4YDX4 CTTN HI CHSV (CAST SUPPLIES) ×2 IMPLANT
PADDING CAST COTTON 4X4 STRL (CAST SUPPLIES) ×2
PADDING CAST COTTON 6X4 STRL (CAST SUPPLIES) ×4 IMPLANT
PENCIL BUTTON HOLSTER BLD 10FT (ELECTRODE) ×4 IMPLANT
SANITIZER HAND PURELL 535ML FO (MISCELLANEOUS) ×4 IMPLANT
SHEET MEDIUM DRAPE 40X70 STRL (DRAPES) ×4 IMPLANT
SLEEVE SCD COMPRESS KNEE MED (MISCELLANEOUS) ×4 IMPLANT
SPLINT FAST PLASTER 5X30 (CAST SUPPLIES) ×40
SPLINT PLASTER CAST FAST 5X30 (CAST SUPPLIES) ×40 IMPLANT
SPONGE LAP 18X18 RF (DISPOSABLE) ×4 IMPLANT
STAPLER VISISTAT 35W (STAPLE) IMPLANT
STOCKINETTE 6  STRL (DRAPES) ×2
STOCKINETTE 6 STRL (DRAPES) ×2 IMPLANT
SUCTION FRAZIER HANDLE 10FR (MISCELLANEOUS) ×2
SUCTION TUBE FRAZIER 10FR DISP (MISCELLANEOUS) IMPLANT
SUT ETHIBOND 2 OS 4 DA (SUTURE) IMPLANT
SUT ETHIBOND 3-0 V-5 (SUTURE) IMPLANT
SUT ETHILON 3 0 PS 1 (SUTURE) ×6 IMPLANT
SUT FIBERWIRE #2 38 T-5 BLUE (SUTURE)
SUT MNCRL AB 3-0 PS2 18 (SUTURE) ×6 IMPLANT
SUT VIC AB 0 CT1 27 (SUTURE)
SUT VIC AB 0 CT1 27XBRD ANBCTR (SUTURE) IMPLANT
SUT VIC AB 0 SH 27 (SUTURE) ×2 IMPLANT
SUT VIC AB 2-0 SH 27 (SUTURE)
SUT VIC AB 2-0 SH 27XBRD (SUTURE) IMPLANT
SUTURE FIBERWR #2 38 T-5 BLUE (SUTURE) IMPLANT
SYR BULB 3OZ (MISCELLANEOUS) ×4 IMPLANT
TOWEL GREEN STERILE FF (TOWEL DISPOSABLE) ×8 IMPLANT
TUBE CONNECTING 20'X1/4 (TUBING) ×1
TUBE CONNECTING 20X1/4 (TUBING) ×1 IMPLANT
UNDERPAD 30X30 (UNDERPADS AND DIAPERS) ×4 IMPLANT

## 2019-01-10 NOTE — Anesthesia Preprocedure Evaluation (Signed)
Anesthesia Evaluation  Patient identified by MRN, date of birth, ID band Patient awake    Reviewed: Allergy & Precautions, NPO status , Patient's Chart, lab work & pertinent test results  Airway Mallampati: II  TM Distance: >3 FB Neck ROM: Full    Dental no notable dental hx.    Pulmonary neg pulmonary ROS,    Pulmonary exam normal breath sounds clear to auscultation       Cardiovascular hypertension, Pt. on medications negative cardio ROS Normal cardiovascular exam Rhythm:Regular Rate:Normal     Neuro/Psych negative neurological ROS  negative psych ROS   GI/Hepatic Neg liver ROS, GERD  ,  Endo/Other  Hypothyroidism Morbid obesity  Renal/GU negative Renal ROS  negative genitourinary   Musculoskeletal negative musculoskeletal ROS (+)   Abdominal (+) + obese,   Peds negative pediatric ROS (+)  Hematology negative hematology ROS (+)   Anesthesia Other Findings   Reproductive/Obstetrics negative OB ROS                             Anesthesia Physical Anesthesia Plan  ASA: III  Anesthesia Plan: General   Post-op Pain Management: GA combined w/ Regional for post-op pain   Induction: Intravenous  PONV Risk Score and Plan: 3 and Ondansetron, Dexamethasone and Midazolam  Airway Management Planned: Oral ETT  Additional Equipment:   Intra-op Plan:   Post-operative Plan: Extubation in OR  Informed Consent: I have reviewed the patients History and Physical, chart, labs and discussed the procedure including the risks, benefits and alternatives for the proposed anesthesia with the patient or authorized representative who has indicated his/her understanding and acceptance.     Dental advisory given  Plan Discussed with: CRNA  Anesthesia Plan Comments:         Anesthesia Quick Evaluation

## 2019-01-10 NOTE — Discharge Instructions (Addendum)
Post Anesthesia Home Care Instructions  Activity: Get plenty of rest for the remainder of the day. A responsible individual must stay with you for 24 hours following the procedure.  For the next 24 hours, DO NOT: -Drive a car -Paediatric nurse -Drink alcoholic beverages -Take any medication unless instructed by your physician -Make any legal decisions or sign important papers.  Meals: Start with liquid foods such as gelatin or soup. Progress to regular foods as tolerated. Avoid greasy, spicy, heavy foods. If nausea and/or vomiting occur, drink only clear liquids until the nausea and/or vomiting subsides. Call your physician if vomiting continues.  Special Instructions/Symptoms: Your throat may feel dry or sore from the anesthesia or the breathing tube placed in your throat during surgery. If this causes discomfort, gargle with warm salt water. The discomfort should disappear within 24 hours.  If you had a scopolamine patch placed behind your ear for the management of post- operative nausea and/or vomiting:  1. The medication in the patch is effective for 72 hours, after which it should be removed.  Wrap patch in a tissue and discard in the trash. Wash hands thoroughly with soap and water. 2. You may remove the patch earlier than 72 hours if you experience unpleasant side effects which may include dry mouth, dizziness or visual disturbances. 3. Avoid touching the patch. Wash your hands with soap and water after contact with the patch.      Regional Anesthesia Blocks  1. Numbness or the inability to move the "blocked" extremity may last from 3-48 hours after placement. The length of time depends on the medication injected and your individual response to the medication. If the numbness is not going away after 48 hours, call your surgeon.  2. The extremity that is blocked will need to be protected until the numbness is gone and the  Strength has returned. Because you cannot feel it, you  will need to take extra care to avoid injury. Because it may be weak, you may have difficulty moving it or using it. You may not know what position it is in without looking at it while the block is in effect.  3. For blocks in the legs and feet, returning to weight bearing and walking needs to be done carefully. You will need to wait until the numbness is entirely gone and the strength has returned. You should be able to move your leg and foot normally before you try and bear weight or walk. You will need someone to be with you when you first try to ensure you do not fall and possibly risk injury.  4. Bruising and tenderness at the needle site are common side effects and will resolve in a few days.  5. Persistent numbness or new problems with movement should be communicated to the surgeon or the [place] [phone] [place] [phone]).   [name], MD [place]  Please read the following information regarding your care after surgery.  Medications  You only need a prescription for the narcotic pain medicine (ex. oxycodone, Percocet, Norco).  All of the other medicines listed below are available over the counter. X Ibuprofen that you have at home as prescribed for the first 3 days after surgery. X acetominophen (Tylenol) 650 mg every 4-6 hours as you need for minor to moderate pain X oxycodone as prescribed for severe pain  Narcotic pain medicine (ex. oxycodone, Percocet, Vicodin) will cause constipation.  To prevent this problem, take the following medicines  while you are taking any pain medicine. X docusate sodium (Colace) 100 mg twice a day X senna (Senokot) 2 tablets twice a day  X To help prevent blood clots, take a baby aspirin (81 mg) twice a day after surgery.  You should also get up every hour while you are awake to move around.    Weight Bearing X Do not bear any weight on the operated leg or foot.  Cast / Splint /  Dressing X Keep your splint, cast or dressing clean and dry.  Dont put anything (coat hanger, pencil, etc) down inside of it.  If it gets damp, use a hair dryer on the cool setting to dry it.  If it gets soaked, call the office to schedule an appointment for a cast change.   After your dressing, cast or splint is removed; you may shower, but do not soak or scrub the wound.  Allow the water to run over it, and then gently pat it dry.  Swelling It is normal for you to have swelling where you had surgery.  To reduce swelling and pain, keep your toes above your nose for at least 3 days after surgery.  It may be necessary to keep your foot or leg elevated for several weeks.  If it hurts, it should be elevated.  Follow Up Call my office at [phone] when you are discharged from the hospital or surgery center to schedule an appointment to be seen two weeks after surgery.  Call my office at [phone] if you develop a fever >101.5 F, nausea, vomiting, bleeding from the surgical site or severe pain.

## 2019-01-10 NOTE — Progress Notes (Signed)
Assisted Dr. Sabra Heck with right, ultrasound guided, popliteal block. Side rails up, monitors on throughout procedure. See vital signs in flow sheet. Tolerated Procedure well.

## 2019-01-10 NOTE — Anesthesia Procedure Notes (Signed)
Anesthesia Regional Block: Popliteal block   Pre-Anesthetic Checklist: ,, timeout performed, Correct Patient, Correct Site, Correct Laterality, Correct Procedure, Correct Position, site marked, Risks and benefits discussed,  Surgical consent,  Pre-op evaluation,  At surgeon's request and post-op pain management  Laterality: Right  Prep: chloraprep       Needles:  Injection technique: Single-shot  Needle Type: Stimiplex     Needle Length: 9cm  Needle Gauge: 21     Additional Needles:   Procedures:,,,, ultrasound used (permanent image in chart),,,,  Narrative:  Start time: 01/10/2019 9:06 AM End time: 01/10/2019 9:11 AM Injection made incrementally with aspirations every 5 mL.  Performed by: Personally  Anesthesiologist: Lynda Rainwater, MD

## 2019-01-10 NOTE — Anesthesia Procedure Notes (Signed)
Procedure Name: Intubation Date/Time: 01/10/2019 10:07 AM Performed by: Signe Colt, CRNA Pre-anesthesia Checklist: Patient identified, Emergency Drugs available, Suction available and Patient being monitored Patient Re-evaluated:Patient Re-evaluated prior to induction Oxygen Delivery Method: Circle system utilized Preoxygenation: Pre-oxygenation with 100% oxygen Induction Type: IV induction Ventilation: Mask ventilation without difficulty Laryngoscope Size: Mac and 3 Grade View: Grade II Tube type: Oral Tube size: 7.0 mm Number of attempts: 1 Airway Equipment and Method: Stylet and Oral airway Placement Confirmation: ETT inserted through vocal cords under direct vision,  positive ETCO2 and breath sounds checked- equal and bilateral Secured at: 21 cm Tube secured with: Tape Dental Injury: Teeth and Oropharynx as per pre-operative assessment

## 2019-01-10 NOTE — Op Note (Signed)
01/10/2019  11:30 AM  PATIENT:  Elizabeth Riggs  48 y.o. female  PRE-OPERATIVE DIAGNOSIS:  Right achilles tendinitis, short achilles tendon, haglund deformity  POST-OPERATIVE DIAGNOSIS:  Right achilles tendinitis, short achilles tendon, haglund deformity  Procedure(s): 1.  Right gastrocnemius recession  2.  Excision of right calcaneus Haglund deformity 3.  Right Achilles tendon debridement and reconstruction   SURGEON:  [name], MD  ASSISTANT: Mechele Claude, PA-C  ANESTHESIA:   General, regional  EBL:  minimal   TOURNIQUET:   Total Tourniquet Time Documented: Thigh (Right) - 50 minutes Total: Thigh (Right) - 50 minutes  COMPLICATIONS:  None apparent  DISPOSITION:  Extubated, awake and stable to recovery.  INDICATION FOR PROCEDURE: The patient is a 48 year old woman with a long history of right posterior heel pain.  She has Achilles insertional tendinopathy and a short Achilles tendon.  She also has a Haglund deformity and has failed nonoperative treatment to date including activity modification, oral anti-inflammatories and shoewear modification.  She presents now for surgical treatment of this painful and limiting condition.  PROCEDURE IN DETAIL: After preoperative consent was obtained and the correct operative site was identified, the patient was brought to the operating room supine on a stretcher.  General anesthesia was induced.  Preoperative antibiotics were administered.  A surgical timeout was taken.  The right lower extremity was exsanguinated and a thigh tourniquet inflated to 350 mmHg.  The patient was then turned in the prone position on the operating table.  The right lower extremity was then prepped and draped in standard sterile fashion.  A longitudinal incision was then made over the gastrocnemius tendon.  Dissection was carried down through the subcutaneous tissues taking care to protect the sural nerve and lesser saphenous vein.  The gastrocnemius tendon was then  divided under direct vision in its entirety.  The wound was irrigated and closed with Monocryl and nylon.  Attention was turned to the posterior heel where a longitudinal incision was made.  Dissection was carried down through the subcutaneous tissues and peritenon developing full-thickness flaps medially and laterally.  The Achilles was then split longitudinally and released from its insertion medially and laterally.  It was debrided of all degenerated tendon centrally leaving greater than 50% intact healthy tendon.  The insertional enthesophyte and Haglund deformity were then resected with the oscillating saw.  The healthy Achilles tendon was repaired back to healthy-appearing bone using 4 suture anchors and an hourglass pattern of nonabsorbable suture.  The ankle could then be dorsiflexed to neutral with no gapping at the repair site.  The wound was irrigated copiously.  The peritenon was repaired with inverted simple sutures of 2-0 Vicryl.  Subcutaneous tissues were approximated with inverted simple sutures of 3-0 Monocryl.  Skin incision was closed with horizontal mattress sutures of 3-0 nylon.  Sterile dressings were applied followed by a well-padded short leg splint.  The tourniquet was released after application of the dressings.  The patient was awakened from anesthesia and transported to the recovery room in stable condition.   FOLLOW UP PLAN: Nonweightbearing on the right lower extremity for 6 weeks postop.  Follow-up in the office in 2 weeks for suture removal and conversion to a short leg cast in slight plantarflexion.  We will plan to change the cast at 4 weeks postop to get her to a neutral position.  Aspirin for DVT prophylaxis.    Mechele Claude PA-C was present and scrubbed for the duration of the operative case. His assistance was  essential in positioning the patient, prepping and draping, gaining and maintaining exposure, performing the operation, closing and dressing the wounds and  applying the splint.

## 2019-01-10 NOTE — H&P (Signed)
Elizabeth Riggs is an 48 y.o. female.   Chief Complaint: right achilles tendonopathy HPI: The patient is a 48 year old female with a long history of right posterior heel pain.  She has insertional Achilles tendinopathy and has failed nonoperative treatment to date including activity modification, oral anti-inflammatories, physical therapy and shoewear modification.  She presents now for surgical treatment of this painful and limiting condition.  Past Medical History:  Diagnosis Date  . Chronic pain of both knees   . Gallstones   . GERD (gastroesophageal reflux disease)   . Hypertension   . Obesity   . Sarcoidosis   . Thyroid disease     Past Surgical History:  Procedure Laterality Date  . ABDOMINAL HYSTERECTOMY    . CHOLECYSTECTOMY  03/12/15   [facility] in [place]  . KNEE SURGERY    . right knee surgery      Family History  Problem Relation Age of Onset  . Hypertension Mother   . Diabetes Father   . Hypertension Father   . Cancer Maternal Grandmother        breast  . Cancer Maternal Grandfather        prostate  . Heart attack Neg Hx    Social History:  reports that she has never smoked. She has never used smokeless tobacco. She reports that she does not drink alcohol or use drugs.  Allergies:  Allergies  Allergen Reactions  . Morphine And Related Itching  . Lisinopril Other (See Comments) and Rash    headache    Medications Prior to Admission  Medication Sig Dispense Refill  . ibuprofen (ADVIL,MOTRIN) 400 MG tablet Take 400 mg by mouth every 6 (six) hours as needed for moderate pain.    Marland Kitchen levothyroxine (SYNTHROID, LEVOTHROID) 125 MCG tablet Take 1 tablet (125 mcg total) by mouth daily. 30 tablet 0  . methocarbamol (ROBAXIN) 500 MG tablet TAKE 1 TABLET (500 MG TOTAL) BY MOUTH EVERY 8 (EIGHT) HOURS AS NEEDED. 60 tablet 1  . omeprazole (PRILOSEC) 20 MG capsule Take 1 capsule (20 mg total) by mouth daily. 30 capsule 0    No results found for this or  any previous visit (from the past 48 hour(s)). No results found.  ROS no recent fever, chills, nausea, vomiting or changes in her appetite  Blood pressure 124/65, pulse 77, temperature 98.1 F (36.7 C), temperature source Oral, resp. rate 15, height 5\' 5"  (1.651 m), weight 116.1 kg, SpO2 100 %. Physical Exam  Well-nourished well-developed woman in no apparent distress.  Alert and oriented x4.  Mood and affect are normal.  Extraocular motions are intact.  Respirations are unlabored.  Gait is antalgic to the right.  The right heel is tender to palpation at the insertion of the Achilles.  Skin is healthy and intact.  Pulses are palpable.  No lymphadenopathy.  Sensibility to light touch is intact dorsally and plantarly at the forefoot.  Assessment/Plan Right Achilles insertional tendinopathy, short Achilles tendon, Haglund deformity -to the operating room today for gastrocnemius recession, Achilles tendon debridement and reconstruction and excision of the Haglund deformity.  The risks and benefits of the alternative treatment options have been discussed in detail.  The patient wishes to proceed with surgery and specifically understands risks of bleeding, infection, nerve damage, blood clots, need for additional surgery, amputation and death.   [name], MD Feb 07, 2019, 9:57 AM

## 2019-01-10 NOTE — Transfer of Care (Signed)
Immediate Anesthesia Transfer of Care Note  Patient: Elizabeth Riggs  Procedure(s) Performed: Right Achilles tendon debridement, Excision of Haglund (Right Foot) GASTROC RECESSION EXTREMITY (Leg Lower)  Patient Location: PACU  Anesthesia Type:GA combined with regional for post-op pain  Level of Consciousness: sedated and patient cooperative  Airway & Oxygen Therapy: Patient Spontanous Breathing and Patient connected to face mask oxygen  Post-op Assessment: Report given to RN and Post -op Vital signs reviewed and stable  Post vital signs: Reviewed and stable  Last Vitals:  Vitals Value Taken Time  BP 154/101 01/10/2019 11:23 AM  Temp    Pulse 87 01/10/2019 11:25 AM  Resp 22 01/10/2019 11:25 AM  SpO2 100 % 01/10/2019 11:25 AM  Vitals shown include unvalidated device data.  Last Pain:  Vitals:   01/10/19 0842  TempSrc: Oral  PainSc: 8          Complications: No apparent anesthesia complications

## 2019-01-11 ENCOUNTER — Encounter (HOSPITAL_BASED_OUTPATIENT_CLINIC_OR_DEPARTMENT_OTHER): Payer: Self-pay | Admitting: Orthopedic Surgery

## 2019-01-11 NOTE — Anesthesia Postprocedure Evaluation (Signed)
Anesthesia Post Note  Patient: Elizabeth Riggs  Procedure(s) Performed: Right Achilles tendon debridement, Excision of Haglund (Right Foot) GASTROC RECESSION EXTREMITY (Leg Lower)     Patient location during evaluation: PACU Anesthesia Type: General Level of consciousness: awake and alert Pain management: pain level controlled Vital Signs Assessment: post-procedure vital signs reviewed and stable Respiratory status: spontaneous breathing, nonlabored ventilation and respiratory function stable Cardiovascular status: blood pressure returned to baseline and stable Postop Assessment: no apparent nausea or vomiting Anesthetic complications: no    Last Vitals:  Vitals:   01/10/19 1200 01/10/19 1230  BP: 131/88 (!) 149/84  Pulse: 72 83  Resp: 14 16  Temp:  36.4 C  SpO2: 100% 98%    Last Pain:  Vitals:   01/11/19 0930  TempSrc:   PainSc: 0-No pain   Pain Goal:                   Lynda Rainwater

## 2019-01-16 DIAGNOSIS — I10 Essential (primary) hypertension: Secondary | ICD-10-CM | POA: Diagnosis not present

## 2019-01-16 DIAGNOSIS — R600 Localized edema: Secondary | ICD-10-CM | POA: Diagnosis not present

## 2019-01-16 DIAGNOSIS — R7303 Prediabetes: Secondary | ICD-10-CM | POA: Diagnosis not present

## 2019-01-23 DIAGNOSIS — H16143 Punctate keratitis, bilateral: Secondary | ICD-10-CM | POA: Diagnosis not present

## 2019-01-23 DIAGNOSIS — Z83518 Family history of other specified eye disorder: Secondary | ICD-10-CM | POA: Diagnosis not present

## 2019-01-23 DIAGNOSIS — D8689 Sarcoidosis of other sites: Secondary | ICD-10-CM | POA: Diagnosis not present

## 2019-01-23 DIAGNOSIS — H3554 Dystrophies primarily involving the retinal pigment epithelium: Secondary | ICD-10-CM | POA: Diagnosis not present

## 2019-01-24 DIAGNOSIS — M6701 Short Achilles tendon (acquired), right ankle: Secondary | ICD-10-CM | POA: Diagnosis not present

## 2019-02-07 DIAGNOSIS — M6701 Short Achilles tendon (acquired), right ankle: Secondary | ICD-10-CM | POA: Diagnosis not present

## 2019-02-08 DIAGNOSIS — M6701 Short Achilles tendon (acquired), right ankle: Secondary | ICD-10-CM | POA: Diagnosis not present

## 2019-02-13 ENCOUNTER — Ambulatory Visit: Payer: BLUE CROSS/BLUE SHIELD | Admitting: Adult Health

## 2019-02-21 DIAGNOSIS — M6701 Short Achilles tendon (acquired), right ankle: Secondary | ICD-10-CM | POA: Diagnosis not present

## 2019-02-28 ENCOUNTER — Ambulatory Visit (INDEPENDENT_AMBULATORY_CARE_PROVIDER_SITE_OTHER): Payer: BLUE CROSS/BLUE SHIELD | Admitting: Adult Health

## 2019-02-28 ENCOUNTER — Encounter: Payer: Self-pay | Admitting: Adult Health

## 2019-02-28 DIAGNOSIS — D869 Sarcoidosis, unspecified: Secondary | ICD-10-CM | POA: Diagnosis not present

## 2019-02-28 NOTE — Progress Notes (Signed)
@Patient  ID: Elizabeth Riggs, female    DOB: 05/02/1971, 48 y.o.   MRN: [id number]  Chief Complaint  Patient presents with  . 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 She presented in 02/2015 with weight loss, intractable nausea and vomiting Had lap chole 03/12/15 and was told that she had a growth on her GB  Pathology showed non-caseating granulomas.   Significant tests/ events  Chest x-ray 12/2014 showed right hilar prominence which was new compared to 05/2014 CT chest 03/2015 - mediastinal lymphadenopathy-subcarinal and precarinal, scattered subcentimeter nodules CT abdomen from 2010 -clear bases PFTs 04/2015 -nml  Echo 08/2015 showed nml EF,Mild LVH c/w HTN  Spirometry 10/2017  shows slightly decreased to stable lung function with FVC at 70%, FEV1 71%, ratio 83.  Chest x-ray August 2019 mild chronic reticular changes notable in the bases Spirometry August 2019 FEV1 73%, ratio 84, FVC 70%-no significant change since 2018  02/28/2019 Follow up : Sarcoid  Patient presents for a 48-month follow-up.  Patient has underlying sarcoidosis.  She says overall her breathing has been doing well . No flare of cough or dyspnea. Not as active of late due to heal/achilles pain. Chest xray and Spirometry last visit was stable.  Influenza vaccine is up-to-date  Had right achilles tendon repair in January . In walking boot. Starting therapy next month.   Goes to ophthalmology every 6 months .   Aunt and cousins have sarcoid .    Allergies  Allergen Reactions  . Morphine And Related Itching  . Lisinopril Other (See Comments) and Rash    headache    Immunization History  Administered Date(s) Administered  . Influenza Split 12/23/2016, 12/26/2017, 07/26/2018    Past Medical History:  Diagnosis Date  . Chronic pain of both knees   . Gallstones   . GERD (gastroesophageal reflux disease)   .  Hypertension   . Obesity   . Sarcoidosis   . Thyroid disease     Tobacco History: Social History   Tobacco Use  Smoking Status Never Smoker  Smokeless Tobacco Never Used   Counseling given: Not Answered   Outpatient Medications Prior to Visit  Medication Sig Dispense Refill  . aspirin EC 81 MG tablet Take 1 tablet (81 mg total) by mouth 2 (two) times daily. 84 tablet 0  . docusate sodium (COLACE) 100 MG capsule Take 1 capsule (100 mg total) by mouth 2 (two) times daily. While taking narcotic pain medicine. 30 capsule 0  . ibuprofen (ADVIL,MOTRIN) 400 MG tablet Take 400 mg by mouth every 6 (six) hours as needed for moderate pain.    Marland Kitchen levothyroxine (SYNTHROID, LEVOTHROID) 125 MCG tablet Take 1 tablet (125 mcg total) by mouth daily. 30 tablet 0  . methocarbamol (ROBAXIN) 500 MG tablet TAKE 1 TABLET (500 MG TOTAL) BY MOUTH EVERY 8 (EIGHT) HOURS AS NEEDED. 60 tablet 1  . omeprazole (PRILOSEC) 20 MG capsule Take 1 capsule (20 mg total) by mouth daily. 30 capsule 0  . senna (SENOKOT) 8.6 MG TABS tablet Take 2 tablets (17.2 mg total) by mouth 2 (two) times daily. 30 each 0   No facility-administered medications prior to visit.      Review of Systems:   Constitutional:   No  weight loss, night sweats,  Fevers, chills,  +fatigue, or  lassitude.  HEENT:   No headaches,  Difficulty swallowing,  Tooth/dental problems, or  Sore throat,  No sneezing, itching, ear ache, nasal congestion, post nasal drip,   CV:  No chest pain,  Orthopnea, PND, swelling in lower extremities, anasarca, dizziness, palpitations, syncope.   GI  No heartburn, indigestion, abdominal pain, nausea, vomiting, diarrhea, change in bowel habits, loss of appetite, bloody stools.   Resp: No shortness of breath with exertion or at rest.  No excess mucus, no productive cough,  No non-productive cough,  No coughing up of blood.  No change in color of mucus.  No wheezing.  No chest wall deformity  Skin: no  rash or lesions.  GU: no dysuria, change in color of urine, no urgency or frequency.  No flank pain, no hematuria   MS:  +walking boot, right achilles repair surgery    Physical Exam  BP 127/85   Pulse 82   Ht 5\' 5"  (1.651 m)   Wt 258 lb (117 kg)   SpO2 98%   BMI 42.93 kg/m   GEN: A/Ox3; pleasant , NAD,  Obesity    HEENT:  [place]/AT,  EACs-clear, TMs-wnl, NOSE-clear, THROAT-clear, no lesions, no postnasal drip or exudate noted.   NECK:  Supple w/ fair ROM; no JVD; normal carotid impulses w/o bruits; no thyromegaly or nodules palpated; no lymphadenopathy.    RESP  Clear  P & A; w/o, wheezes/ rales/ or rhonchi. no accessory muscle use, no dullness to percussion  CARD:  RRR, no m/r/g, no peripheral edema, pulses intact, no cyanosis or clubbing.  GI:   Soft & nt; nml bowel sounds; no organomegaly or masses detected.   Musco: Warm bil, no deformities or joint swelling noted. Right walking boot.   Neuro: alert, no focal deficits noted.    Skin: Warm, no lesions or rashes    Lab Results:  CBC  BNP No results found for: BNP  Imaging: No results found.    PFT Results Latest Ref Rng & Units 05/12/2015  FVC-Pre L 2.73  FVC-Predicted Pre % 84  FVC-Post L 2.60  FVC-Predicted Post % 80  Pre FEV1/FVC % % 87  Post FEV1/FCV % % 88  FEV1-Pre L 2.36  FEV1-Predicted Pre % 89  FEV1-Post L 2.30  DLCO UNC% % 84  DLCO COR %Predicted % 125  TLC L 3.80  TLC % Predicted % 71  RV % Predicted % 62    No results found for: NITRICOXIDE      Assessment & Plan:   Sarcoidosis Appear stable without flare  Continue with eye check up.  Activity as tolerated.  Influenza vaccine utd.  Follow up in 6 months and As needed    Class 3 obesity due to excess calories without serious comorbidity with body mass index (BMI) of 40.0 to 44.9 in adult [facility]) Weight loss      Rexene Edison, NP 02/28/2019

## 2019-02-28 NOTE — Patient Instructions (Addendum)
Continue on current regimen .  Follow up with Dr. Elsworth Soho  Or Louis Ivery NP in 6 months and As needed   Please contact office for sooner follow up if symptoms do not improve or worsen or seek emergency care

## 2019-02-28 NOTE — Assessment & Plan Note (Signed)
Appear stable without flare  Continue with eye check up.  Activity as tolerated.  Influenza vaccine utd.  Follow up in 6 months and As needed

## 2019-02-28 NOTE — Assessment & Plan Note (Signed)
-   Weight loss 

## 2019-03-09 DIAGNOSIS — M6701 Short Achilles tendon (acquired), right ankle: Secondary | ICD-10-CM | POA: Diagnosis not present

## 2019-03-25 ENCOUNTER — Ambulatory Visit: Payer: BLUE CROSS/BLUE SHIELD | Admitting: Physical Therapy

## 2019-03-25 ENCOUNTER — Encounter: Payer: Self-pay | Admitting: Physical Therapy

## 2019-03-25 ENCOUNTER — Other Ambulatory Visit: Payer: Self-pay

## 2019-03-25 DIAGNOSIS — M6281 Muscle weakness (generalized): Secondary | ICD-10-CM

## 2019-03-25 DIAGNOSIS — M25671 Stiffness of right ankle, not elsewhere classified: Secondary | ICD-10-CM

## 2019-03-25 DIAGNOSIS — R2689 Other abnormalities of gait and mobility: Secondary | ICD-10-CM | POA: Diagnosis not present

## 2019-03-25 DIAGNOSIS — M25571 Pain in right ankle and joints of right foot: Secondary | ICD-10-CM

## 2019-03-25 NOTE — Therapy (Addendum)
Elizabeth Riggs Elizabeth Riggs Elizabeth Riggs Elizabeth Riggs, [place], [address] Phone: [phone]   Fax:  [phone]  Physical Therapy Evaluation  Patient Details  Name: Elizabeth Riggs MRN: [id number] Date of Birth: [birth year] Referring Provider (PT): Elizabeth Simmer, MD   Encounter Date: 03/25/2019  PT End of Session - 03/25/19 1300    Visit Number  1    Number of Visits  12    Date for PT Re-Evaluation  05/06/19    PT Start Time  1140    PT Stop Time  1225    PT Time Calculation (min)  45 min    Activity Tolerance  Patient tolerated treatment well    Behavior During Therapy  [facility] for tasks assessed/performed       Past Medical History:  Diagnosis Date  . Chronic pain of both knees   . Gallstones   . GERD (gastroesophageal reflux disease)   . Hypertension   . Obesity   . Sarcoidosis   . Thyroid disease     Past Surgical History:  Procedure Laterality Date  . ABDOMINAL HYSTERECTOMY    . CHOLECYSTECTOMY  03/12/15   [facility] in [place]  . EXCISION HAGLUND'S DEFORMITY WITH ACHILLES TENDON REPAIR Right 01/10/2019   Procedure: Right Achilles tendon debridement, Excision of Haglund;  Surgeon: Elizabeth Simmer, MD;  Location: [place];  Service: Orthopedics;  Laterality: Right;  84min  . GASTROC RECESSION EXTREMITY  01/10/2019   Procedure: GASTROC RECESSION EXTREMITY;  Surgeon: Elizabeth Simmer, MD;  Location: [place];  Service: Orthopedics;;  . KNEE SURGERY    . right knee surgery      There were no vitals filed for this visit.  Subjective Assessment - 03/25/19 1140    Subjective  Pt is a 48 y/o female who presents to OPPT s/p Rt achilles tendon reconstruction on 01/10/2019.  Pt was in CAM boot until Friday 3/26 and is currently weaning from boot at this time.  Pt presents today with difficulty donning shoes, continued pain and decreased ROM.    Patient Stated Goals  improve strength in Rt foot     Currently in Pain?  Yes    Pain Score  0-No pain   up to 8-9/10 at night   Pain Location  Heel    Pain Orientation  Right    Pain Descriptors / Indicators  Throbbing    Pain Type  Acute pain;Surgical pain    Pain Onset  More than a month ago    Pain Frequency  Intermittent    Aggravating Factors   walking/standing (2 hours)    Pain Relieving Factors  ibuprofen; settles in ~ 1 hour         [facility] PT Assessment - 03/25/19 1144      Assessment   Medical Diagnosis  Rt achilles tendon reconstruction    Referring Provider (PT)  Elizabeth Simmer, MD    Onset Date/Surgical Date  01/10/19    Hand Dominance  Right    Next MD Visit  04/19/2019    Prior Therapy  yes - not for this issue      Precautions   Precautions  None      Restrictions   Weight Bearing Restrictions  No      Balance Screen   Has the patient fallen in the past 6 months  No    Has the patient had a decrease in activity level because of a fear of falling?  Yes    Is the patient reluctant to leave their home because of a fear of falling?   No      Home Film/video editor residence    Living Arrangements  Parent   mother   Type of [place] to enter    Entrance Stairs-Number of Steps  1    [place]  Two level;Able to live on main level with bedroom/bathroom    Additional Comments  currently living with mother; lives on 2nd story apartment (15-16 steps total)      Prior Function   Level of Independence  Independent    Vocation  On disability;Full time employment    Pensions consultant - [employer]    Leisure  gambling, movies; spend time with 7 grandchildren      Cognition   Overall Cognitive Status  Within Functional Limits for tasks assessed      Observation/Other Assessments   Focus on Therapeutic Outcomes (FOTO)   60 (40% limited; predicted 27% limited)      Functional Tests   Functional tests  Single leg stance      Single Leg Stance   Comments   RLE: < 3 seconds; LLE: > 5 sec      ROM / Strength   AROM / PROM / Strength  AROM;PROM;Strength      AROM   AROM Assessment Site  Ankle    Right/Left Shoulder  Left;Right    Right/Left Ankle  Right;Left    Right Ankle Dorsiflexion  -6    Right Ankle Plantar Flexion  56    Right Ankle Inversion  25    Right Ankle Eversion  15    Left Ankle Dorsiflexion  2    Left Ankle Plantar Flexion  60    Left Ankle Inversion  25    Left Ankle Eversion  20      PROM   PROM Assessment Site  Ankle    Right/Left Ankle  Right    Right Ankle Dorsiflexion  10    Right Ankle Plantar Flexion  60    Right Ankle Inversion  35    Right Ankle Eversion  25      Strength   Overall Strength Comments  give way weakness noted with Rt ankle testing    Strength Assessment Site  Ankle    Right/Left Ankle  Right;Left    Right Ankle Dorsiflexion  3/5    Right Ankle Plantar Flexion  1/5   attempted standing   Right Ankle Inversion  2/5    Right Ankle Eversion  2/5    Left Ankle Dorsiflexion  5/5    Left Ankle Plantar Flexion  3/5    Left Ankle Inversion  4/5    Left Ankle Eversion  4/5      Ambulation/Gait   Ambulation/Gait  Yes    Gait Pattern  Step-to pattern;Decreased stance time - right;Decreased step length - left;Decreased dorsiflexion - right;Antalgic   RLE externally rotated                  [facility] Adult PT Treatment/Exercise - 03/25/19 1144      Exercises   Exercises  Ankle      Ankle Exercises: Seated   ABC's Limitations  issued to HEP    Ankle Circles/Pumps Limitations  issued to HEP      Ankle Exercises: Supine   T-Band  seated red  theraband; Rt 4-way x 10 reps             PT Education - 03/25/19 1300    Education provided  Yes    Education Details  HEP    Person(s) Educated  Patient    Methods  Explanation;Demonstration;Handout    Comprehension  Verbalized understanding;Returned demonstration          PT Long Term Goals - 03/25/19 1304      PT LONG  TERM GOAL #1   Title  independent with HEP    Status  New    Target Date  05/06/19      PT LONG TERM GOAL #2   Title  improve Rt ankle strength to at least 4/5 for improved function    Status  New    Target Date  05/06/19      PT LONG TERM GOAL #3   Title  FOTO score improved to </= 27% limited for improved function    Status  New    Target Date  05/06/19      PT LONG TERM GOAL #4   Title  demonstrate improved RLE strength and balance by performing RLE SLS > 10 sec without LOB    Status  New    Target Date  05/06/19      PT LONG TERM GOAL #5   Title  report ability to walk/stand > 3 hours with pain < 4/10 for improved function and mobility.    Status  New    Target Date  05/06/19            Plan - 03/25/19 1300    Clinical Impression Statement  Pt is a 48 y/o female who presents to OPPT s/p Rt achilles tendon reconstruction on 01/10/2019.  Pt presents today with ROM and strength limitations, gait abnormalities, continued episodes of pain and decreased mobility.  Pt will benefit from PT to address deficits listed.    Personal Factors and Comorbidities  Comorbidity 1;Past/Current Experience    Comorbidities  sarcoidosis    Examination-Activity Limitations  Locomotion Level;Stairs;Stand    Examination-Participation Restrictions  Cleaning;Other   work Cytogeneticist  Evolving/Moderate complexity    Clinical Decision Making  Moderate    Rehab Potential  Good    PT Frequency  2x / week   possible combination of in person and telehealth visits   PT Duration  6 weeks    PT Treatment/Interventions  ADLs/Self Care Home Management;Cryotherapy;Electrical Stimulation;Iontophoresis 4mg /ml Dexamethasone;Moist Heat;Ultrasound;Neuromuscular re-education;Therapeutic exercise;Therapeutic activities;Patient/family education;Manual techniques;Passive range of motion;Vasopneumatic Device;Taping;Dry needling;Functional mobility training;Stair training;Gait  training    PT Next Visit Plan  review initial HEP, add gastroc/soleus stretches; work on functional ankle strength    PT Home Exercise Plan  Access Code: [email or web address]     [email or web address] and Agree with Plan of Care  Patient       Patient will benefit from skilled therapeutic intervention in order to improve the following deficits and impairments:  Abnormal gait, Increased fascial restricitons, Pain, Decreased mobility, Decreased range of motion, Decreased strength, Impaired flexibility, Difficulty walking, Decreased balance  Visit Diagnosis: Pain in right ankle and joints of right foot - Plan: PT plan of care cert/re-cert  Stiffness of right ankle, not elsewhere classified - Plan: PT plan of care cert/re-cert  Other abnormalities of gait and mobility - Plan: PT plan of care cert/re-cert  Muscle weakness (generalized) - Plan: PT plan of care cert/re-cert     Problem List Patient Active Problem  List   Diagnosis Date Noted  . Pain of left calf 08/07/2018  . Achilles tendinitis of right lower extremity 04/27/2018  . Plantar fasciitis of right foot 03/09/2018  . Bilateral ankle pain 09/06/2017  . Varicose veins of leg with swelling, bilateral 07/27/2017  . Venous insufficiency of both lower extremities 07/04/2017  . Right shoulder pain 03/03/2017  . Macular dystrophy 01/12/2017  . Left elbow pain 11/09/2016  . Class 3 obesity due to excess calories without serious comorbidity with body mass index (BMI) of 40.0 to 44.9 in adult 11/02/2016  . GERD (gastroesophageal reflux disease) 07/10/2015  . Geographic tongue 04/28/2015  . Oral thrush 04/28/2015  . Sarcoidosis 04/09/2015  . Hyperglycemia 04/08/2015  . Hypokalemia 03/31/2015  . Constipation 03/19/2015  . Abdominal pain, epigastric 02/18/2015  . Hypothyroidism 02/18/2015  . HTN (hypertension) 02/18/2015  . Bilateral knee pain 05/12/2011      Laureen Abrahams, PT, DPT 03/25/19 1:19 PM     [facility] [place] [address] [place], [place], [address] Phone: [phone]   Fax:  [phone]  Name: DESTANY SEVERNS MRN: [id number] Date of Birth: [birth year]

## 2019-03-25 NOTE — Addendum Note (Signed)
Addended by: Faustino Congress F on: 03/25/2019 01:19 PM   Modules accepted: Orders

## 2019-03-25 NOTE — Patient Instructions (Signed)
Access Code: [email or web address]  URL: [email or web address]  Date: 03/25/2019  Prepared by: Faustino Congress   Exercises  Seated Eccentric Ankle Plantar Flexion with Resistance - Straight Leg - 20 reps - 1 sets - 2x daily - 7x weekly  Seated Ankle Dorsiflexion with Resistance - 20 reps - 1 sets - 2x daily - 7x weekly  Seated Ankle Eversion with Resistance - 20 reps - 1 sets - 2x daily - 7x weekly  Seated Ankle Inversion with Resistance - 20 reps - 1 sets - 2x daily - 7x weekly  Seated Ankle Pumps - 15 reps - 1 sets - 2x daily - 7x weekly  Seated Ankle Circles - 15 reps - 1 sets - 2x daily - 7x weekly  Seated Ankle Alphabet - 1 sets - 1 A-Z - 2x daily - 7x weekly

## 2019-03-28 ENCOUNTER — Other Ambulatory Visit: Payer: Self-pay

## 2019-03-28 ENCOUNTER — Ambulatory Visit (INDEPENDENT_AMBULATORY_CARE_PROVIDER_SITE_OTHER): Payer: BLUE CROSS/BLUE SHIELD | Admitting: Physical Therapy

## 2019-03-28 ENCOUNTER — Encounter: Payer: Self-pay | Admitting: Physical Therapy

## 2019-03-28 DIAGNOSIS — M25671 Stiffness of right ankle, not elsewhere classified: Secondary | ICD-10-CM

## 2019-03-28 DIAGNOSIS — Z713 Dietary counseling and surveillance: Secondary | ICD-10-CM | POA: Diagnosis not present

## 2019-03-28 DIAGNOSIS — M6281 Muscle weakness (generalized): Secondary | ICD-10-CM

## 2019-03-28 DIAGNOSIS — M25571 Pain in right ankle and joints of right foot: Secondary | ICD-10-CM

## 2019-03-28 DIAGNOSIS — R2689 Other abnormalities of gait and mobility: Secondary | ICD-10-CM

## 2019-03-28 NOTE — Therapy (Signed)
[place], [place], [address] Phone: [phone]   Fax:  [phone]  Physical Therapy Treatment  Patient Details  Name: Elizabeth Riggs MRN: [id number] Date of Birth: 06/24/1971 Referring Provider (PT): [name], MD   Encounter Date: 03/28/2019  PT End of Session - 03/28/19 1256    Visit Number  2    Number of Visits  12    Date for PT Re-Evaluation  05/06/19    PT Start Time  1200    PT Stop Time  1243    PT Time Calculation (min)  43 min    Activity Tolerance  Patient tolerated treatment well    Behavior During Therapy  [facility] for tasks assessed/performed       Past Medical History:  Diagnosis Date  . Chronic pain of both knees   . Gallstones   . GERD (gastroesophageal reflux disease)   . Hypertension   . Obesity   . Sarcoidosis   . Thyroid disease     Past Surgical History:  Procedure Laterality Date  . ABDOMINAL HYSTERECTOMY    . CHOLECYSTECTOMY  03/12/15   [facility] in [place]  . EXCISION HAGLUND'S DEFORMITY WITH ACHILLES TENDON REPAIR Right 01/10/2019   Procedure: Right Achilles tendon debridement, Excision of Haglund;  Surgeon: [name], MD;  Location: [place];  Service: Orthopedics;  Laterality: Right;  10mn  . GASTROC RECESSION EXTREMITY  01/10/2019   Procedure: GASTROC RECESSION EXTREMITY;  Surgeon: HWylene Simmer MD;  [place]  Service: Orthopedics;;  . KNEE SURGERY    . right knee surgery      There were no vitals filed for this visit.  Subjective Assessment - 03/28/19 1157    Subjective  wearing shoe today; got moleskin and applied to heel which seemed to help.    Patient Stated Goals  improve strength in Rt foot    Currently in Pain?  No/denies         [facility] - 03/28/19 1205      Assessment   Medical Diagnosis  Rt achilles tendon reconstruction    Referring Provider (PT)  JWylene Simmer MD    Onset Date/Surgical Date  01/10/19    Hand Dominance  Right    Next MD Visit  04/19/2019                   [facility] - 03/28/19 1159      Manual Therapy   Joint Mobilization  Rt ankle A/P grades 2-3    Soft tissue mobilization  Rt gastroc and soleus      Ankle Exercises: Aerobic   Recumbent Bike  L1 x 8 min      Ankle Exercises: Stretches   Soleus Stretch  3 reps;30 seconds    Gastroc Stretch  3 reps;30 seconds      Ankle Exercises: Supine   T-Band  seated red theraband; Rt 4-way x 20 reps      Ankle Exercises: Seated   ABC's  1 rep    Ankle Circles/Pumps  20 reps;Right      Ankle Exercises: Standing   Other Standing Ankle Exercises  mobilization with movement; standing dorsiflexion and forward step with black theraband resistance x 20 reps each    Other Standing Ankle Exercises  gait training with cues for mechanics  PT Long Term Goals - 03/25/19 1304      PT LONG TERM GOAL #1   Title  independent with HEP    Status  New    Target Date  05/06/19      PT LONG TERM GOAL #2   Title  improve Rt ankle strength to at least 4/5 for improved function    Status  New    Target Date  05/06/19      PT LONG TERM GOAL #3   Title  FOTO score improved to </= 27% limited for improved function    Status  New    Target Date  05/06/19      PT LONG TERM GOAL #4   Title  demonstrate improved RLE strength and balance by performing RLE SLS > 10 sec without LOB    Status  New    Target Date  05/06/19      PT LONG TERM GOAL #5   Title  report ability to walk/stand > 3 hours with pain < 4/10 for improved function and mobility.    Status  New    Target Date  05/06/19            Plan - 03/28/19 1257    Clinical Impression Statement  Pt tolerated session well with improved gait mechanics following mobilization with movement exercises today.  Overall doing well with PT, no goals met as only 2nd visit.     Personal Factors and Comorbidities  Comorbidity 1;Past/Current Experience    Comorbidities  sarcoidosis    Examination-Activity Limitations  Locomotion Level;Stairs;Stand    Examination-Participation Restrictions  Cleaning;Other   work Cytogeneticist  Evolving/Moderate complexity    Rehab Potential  Good    PT Frequency  2x / week   possible combination of in person and telehealth visits   PT Duration  6 weeks    PT Treatment/Interventions  ADLs/Self Care Home Management;Cryotherapy;Electrical Stimulation;Iontophoresis 16m/ml Dexamethasone;Moist Heat;Ultrasound;Neuromuscular re-education;Therapeutic exercise;Therapeutic activities;Patient/family education;Manual techniques;Passive range of motion;Vasopneumatic Device;Taping;Dry needling;Functional mobility training;Stair training;Gait training    PT Next Visit Plan  work on gait and mechanics, add gastroc/soleus stretches; work on functional ankle strength    PT Home Exercise Plan  Access Code: [email or web address]     [email or web address] Agree with Plan of Care  Patient       Patient will benefit from skilled therapeutic intervention in order to improve the following deficits and impairments:  Abnormal gait, Increased fascial restricitons, Pain, Decreased mobility, Decreased range of motion, Decreased strength, Impaired flexibility, Difficulty walking, Decreased balance  Visit Diagnosis: Pain in right ankle and joints of right foot  Stiffness of right ankle, not elsewhere classified  Other abnormalities of gait and mobility  Muscle weakness (generalized)     Problem List Patient Active Problem List   Diagnosis Date Noted  . Pain of left calf 08/07/2018  . Achilles tendinitis of right lower extremity 04/27/2018  . Plantar fasciitis of right foot 03/09/2018  . Bilateral ankle pain 09/06/2017  . Varicose veins of leg with swelling, bilateral 07/27/2017  . Venous insufficiency of both lower extremities  07/04/2017  . Right shoulder pain 03/03/2017  . Macular dystrophy 01/12/2017  . Left elbow pain 11/09/2016  . Class 3 obesity due to excess calories without serious comorbidity with body mass index (BMI) of 40.0 to 44.9 in adult 11/02/2016  . GERD (gastroesophageal reflux disease) 07/10/2015  . Geographic tongue 04/28/2015  . Oral thrush 04/28/2015  . Sarcoidosis 04/09/2015  .  Hyperglycemia 04/08/2015  . Hypokalemia 03/31/2015  . Constipation 03/19/2015  . Abdominal pain, epigastric 02/18/2015  . Hypothyroidism 02/18/2015  . HTN (hypertension) 02/18/2015  . Bilateral knee pain 05/12/2011      Laureen Abrahams, PT, DPT 03/28/19 12:59 PM     [facility] [place], [place], [address] Phone: [phone]   Fax:  [phone]  Name: Elizabeth Riggs MRN: [id number] Date of Birth: 1971-09-08

## 2019-04-04 ENCOUNTER — Other Ambulatory Visit: Payer: Self-pay

## 2019-04-04 ENCOUNTER — Encounter: Payer: Self-pay | Admitting: Physical Therapy

## 2019-04-04 ENCOUNTER — Ambulatory Visit (INDEPENDENT_AMBULATORY_CARE_PROVIDER_SITE_OTHER): Payer: BLUE CROSS/BLUE SHIELD | Admitting: Physical Therapy

## 2019-04-04 DIAGNOSIS — M25571 Pain in right ankle and joints of right foot: Secondary | ICD-10-CM | POA: Diagnosis not present

## 2019-04-04 DIAGNOSIS — M25671 Stiffness of right ankle, not elsewhere classified: Secondary | ICD-10-CM | POA: Diagnosis not present

## 2019-04-04 DIAGNOSIS — M6281 Muscle weakness (generalized): Secondary | ICD-10-CM | POA: Diagnosis not present

## 2019-04-04 DIAGNOSIS — R2689 Other abnormalities of gait and mobility: Secondary | ICD-10-CM | POA: Diagnosis not present

## 2019-04-04 NOTE — Therapy (Signed)
[place], [place], [address] Phone: [phone]   Fax:  [phone]  Physical Therapy Treatment  Patient Details  Name: Elizabeth Riggs MRN: [id number] Date of Birth: [birth year] Referring Provider (PT): [name], MD   Encounter Date: 04/04/2019  PT End of Session - 04/04/19 1114    Visit Number  3    Number of Visits  12    Date for PT Re-Evaluation  05/06/19    PT Start Time  3846   pt arrived late   PT Stop Time  1153    PT Time Calculation (min)  42 min    Activity Tolerance  Patient tolerated treatment well    Behavior During Therapy  [facility] for tasks assessed/performed       Past Medical History:  Diagnosis Date  . Chronic pain of both knees   . Gallstones   . GERD (gastroesophageal reflux disease)   . Hypertension   . Obesity   . Sarcoidosis   . Thyroid disease     Past Surgical History:  Procedure Laterality Date  . ABDOMINAL HYSTERECTOMY    . CHOLECYSTECTOMY  03/12/15   [facility] in [place]  . EXCISION HAGLUND'S DEFORMITY WITH ACHILLES TENDON REPAIR Right 01/10/2019   Procedure: Right Achilles tendon debridement, Excision of Haglund;  Surgeon: [name], MD;  Location: [place];  Service: Orthopedics;  Laterality: Right;  18mn  . GASTROC RECESSION EXTREMITY  01/10/2019   Procedure: GASTROC RECESSION EXTREMITY;  Surgeon: HWylene Simmer MD;  [place]  Service: Orthopedics;;  . KNEE SURGERY    . right knee surgery      There were no vitals filed for this visit.  Subjective Assessment - 04/04/19 1117    Subjective  Pt reports she was sore for 2 days after riding bike last session.  HEP is going well overall.  She continues to wear moleskin over Rt heel because it "it sensitive".     Patient Stated Goals  improve strength in Rt foot    Currently in Pain?  No/denies    Pain Score  0-No pain   took pain medication  30 min prior to session        [facility] - 04/04/19 0001      Assessment   Medical Diagnosis  Rt achilles tendon reconstruction    Referring Provider (PT)  JWylene Simmer MD    Onset Date/Surgical Date  01/10/19    Hand Dominance  Right    Next MD Visit  04/19/2019      AROM   Right Ankle Dorsiflexion  3    Right Ankle Plantar Flexion  69    Right Ankle Inversion  36    Right Ankle Eversion  24       [facility] Adult PT Treatment/Exercise - 04/04/19 0001      Self-Care   Self-Care  Scar Mobilizations;Heat/Ice Application;Other Self-Care Comments    Scar Mobilizations  Pt re-educated in proper scar massage to decrease fascial adhesions; pt returned demo with cues.     Heat/Ice Application  Pt educated on ice / ice massage application parameters; pt verbalized understanding.     Other Self-Care Comments   Pt educated on self massage with roller stick to Rt lower leg to decrease fascial tightness and improve ankle ROM; pt returned demo with cues.       Exercises   Exercises  Ankle  Modalities   Modalities  --   pt declined     Ankle Exercises: Stretches   Soleus Stretch  3 reps;30 seconds   Rt; seated with strap   Gastroc Stretch  3 reps;20 seconds   standing     Ankle Exercises: Supine   T-Band  seated Rt ankle: Red band for eversion, inversion, DF x 15 each; cues for improved form.       Ankle Exercises: Seated   Ankle Circles/Pumps  20 reps;Right    Heel Raises  Both;20 reps    Toe Raise  20 reps      Ankle Exercises: Standing   SLS  3 reps on RLE: 5, 10, and 15 sec without UE support or LOB.       Ankle Exercises: Aerobic   Nustep  L4: 5.5 min              PT Education - 04/04/19 1212    Education provided  Yes    Education Details  HEP; self massage     Person(s) Educated  Patient    Methods  Explanation;Demonstration;Verbal cues;Handout    Comprehension  Verbalized understanding;Returned demonstration          PT Long Term Goals -  04/04/19 1204      PT LONG TERM GOAL #1   Title  independent with HEP    Status  On-going      PT LONG TERM GOAL #2   Title  improve Rt ankle strength to at least 4/5 for improved function    Status  On-going      PT LONG TERM GOAL #3   Title  FOTO score improved to </= 27% limited for improved function    Status  On-going      PT LONG TERM GOAL #4   Title  demonstrate improved RLE strength and balance by performing RLE SLS > 10 sec without LOB    Status  On-going      PT LONG TERM GOAL #5   Title  report ability to walk/stand > 3 hours with pain < 4/10 for improved function and mobility.    Status  On-going            Plan - 04/04/19 1203    Clinical Impression Statement  Pt demonstrated improved Rt ankle AROM.  She was able to stand for 15 sec on RLE without LOB; has met LTG #4.  She tolerated all exercises well, with some increase in pain.  Pt declined modalities at end of session.     Personal Factors and Comorbidities  Comorbidity 1;Past/Current Experience    Comorbidities  sarcoidosis    Rehab Potential  Good    PT Frequency  2x / week   see eval   PT Duration  6 weeks    PT Treatment/Interventions  ADLs/Self Care Home Management;Cryotherapy;Electrical Stimulation;Iontophoresis 79m/ml Dexamethasone;Moist Heat;Ultrasound;Neuromuscular re-education;Therapeutic exercise;Therapeutic activities;Patient/family education;Manual techniques;Passive range of motion;Vasopneumatic Device;Taping;Dry needling;Functional mobility training;Stair training;Gait training    PT Next Visit Plan  work on gait and mechanics, add gastroc/soleus stretches to HEP; work on functional ankle strength including stairs.     PT Home Exercise Plan  Access Code: [email or web address] and VHI exercises added today.     Consulted and Agree with Plan of Care  Patient       Patient will benefit from skilled therapeutic intervention in order to improve the following deficits and impairments:  Abnormal gait, Increased  fascial restricitons, Pain, Decreased mobility, Decreased range of motion,  Decreased strength, Impaired flexibility, Difficulty walking, Decreased balance  Visit Diagnosis: Pain in right ankle and joints of right foot  Stiffness of right ankle, not elsewhere classified  Other abnormalities of gait and mobility  Muscle weakness (generalized)     Problem List Patient Active Problem List   Diagnosis Date Noted  . Pain of left calf 08/07/2018  . Achilles tendinitis of right lower extremity 04/27/2018  . Plantar fasciitis of right foot 03/09/2018  . Bilateral ankle pain 09/06/2017  . Varicose veins of leg with swelling, bilateral 07/27/2017  . Venous insufficiency of both lower extremities 07/04/2017  . Right shoulder pain 03/03/2017  . Macular dystrophy 01/12/2017  . Left elbow pain 11/09/2016  . Class 3 obesity due to excess calories without serious comorbidity with body mass index (BMI) of 40.0 to 44.9 in adult 11/02/2016  . GERD (gastroesophageal reflux disease) 07/10/2015  . Geographic tongue 04/28/2015  . Oral thrush 04/28/2015  . Sarcoidosis 04/09/2015  . Hyperglycemia 04/08/2015  . Hypokalemia 03/31/2015  . Constipation 03/19/2015  . Abdominal pain, epigastric 02/18/2015  . Hypothyroidism 02/18/2015  . HTN (hypertension) 02/18/2015  . Bilateral knee pain 05/12/2011   Kerin Perna, PTA 04/04/19 12:20 PM  [place], [place], [address] Phone: [phone]   Fax:  [phone]  Name: GRETHEL ZENK MRN: [id number] Date of Birth: May 03, 1971

## 2019-04-04 NOTE — Patient Instructions (Addendum)
ANKLE: Eversion, Unilateral (Band)    Place band around left foot. Keeping heel in place, raise toes of banded foot up and away from body. Do not move hip. 10 rep   Ankle Bend (Dorsiflexion and Plantar Flexion)    Sitting or lying down, point toes up, keeping both heels on floor. Then press toes to floor, raising heels. Repeat __10__ times. Do __several __ sessions per day. Balance: Unilateral    Attempt to balance on left leg, eyes open. Hold __15-30__ seconds. Repeat __3__ times per set. Do ___1_ sets per session. Do __1__ sessions per day.   [facility] [address] [place], [place] [address]  [phone] (office) [phone] (fax)

## 2019-04-09 DIAGNOSIS — M6701 Short Achilles tendon (acquired), right ankle: Secondary | ICD-10-CM | POA: Diagnosis not present

## 2019-04-11 ENCOUNTER — Ambulatory Visit (INDEPENDENT_AMBULATORY_CARE_PROVIDER_SITE_OTHER): Payer: BLUE CROSS/BLUE SHIELD | Admitting: Physical Therapy

## 2019-04-11 ENCOUNTER — Other Ambulatory Visit: Payer: Self-pay

## 2019-04-11 ENCOUNTER — Encounter: Payer: Self-pay | Admitting: Physical Therapy

## 2019-04-11 DIAGNOSIS — M6281 Muscle weakness (generalized): Secondary | ICD-10-CM | POA: Diagnosis not present

## 2019-04-11 DIAGNOSIS — M25671 Stiffness of right ankle, not elsewhere classified: Secondary | ICD-10-CM | POA: Diagnosis not present

## 2019-04-11 DIAGNOSIS — M25571 Pain in right ankle and joints of right foot: Secondary | ICD-10-CM

## 2019-04-11 DIAGNOSIS — R2689 Other abnormalities of gait and mobility: Secondary | ICD-10-CM

## 2019-04-11 NOTE — Therapy (Signed)
[place], [place], [address] Phone: [phone]   Fax:  [phone]  Physical Therapy Treatment  Patient Details  Name: Elizabeth Riggs MRN: [id number] Date of Birth: [birth year] Referring Provider (PT): [name], MD   Encounter Date: 04/11/2019  PT End of Session - 04/11/19 1226    Visit Number  4    Number of Visits  12    Date for PT Re-Evaluation  05/06/19    PT Start Time  1008    PT Stop Time  1055    PT Time Calculation (min)  47 min    Behavior During Therapy  [facility] for tasks assessed/performed       Past Medical History:  Diagnosis Date  . Chronic pain of both knees   . Gallstones   . GERD (gastroesophageal reflux disease)   . Hypertension   . Obesity   . Sarcoidosis   . Thyroid disease     Past Surgical History:  Procedure Laterality Date  . ABDOMINAL HYSTERECTOMY    . CHOLECYSTECTOMY  03/12/15   [facility] in [place]  . EXCISION HAGLUND'S DEFORMITY WITH ACHILLES TENDON REPAIR Right 01/10/2019   Procedure: Right Achilles tendon debridement, Excision of Haglund;  Surgeon: [name], MD;  Location: [place];  Service: Orthopedics;  Laterality: Right;  60mn  . GASTROC RECESSION EXTREMITY  01/10/2019   Procedure: GASTROC RECESSION EXTREMITY;  Surgeon: HWylene Simmer MD;  [place]  Service: Orthopedics;;  . KNEE SURGERY    . right knee surgery      There were no vitals filed for this visit.  Subjective Assessment - 04/11/19 1010    Subjective  Pt reports she walked 1 mile yesterday; got tired.  She is not wearing boot as much, just tennis shoes.  The raw spot on back of of Rt heel is closing up now.  She has been massaging her Rt calf at night.     Currently in Pain?  Yes    Pain Score  2     Pain Location  Heel    Pain Orientation  Right    Pain Descriptors / Indicators  Sore    Aggravating Factors   walking  increased distance    Pain Relieving Factors  rest; medicine         [facility] - 04/11/19 0001      Assessment   Medical Diagnosis  Rt achilles tendon reconstruction    Referring Provider (PT)  JWylene Simmer MD    Onset Date/Surgical Date  01/10/19    Hand Dominance  Right    Next MD Visit  04/19/2019      AROM   Right Ankle Dorsiflexion  5    Right Ankle Plantar Flexion  69    Right Ankle Inversion  36    Right Ankle Eversion  24      Strength   Right/Left Ankle  Right    Right Ankle Dorsiflexion  4/5    Right Ankle Plantar Flexion  2+/5   can complete partial heel raise in standing   Right Ankle Inversion  4/5   with pain    Right Ankle Eversion  4/5       [facility] Adult PT Treatment/Exercise - 04/11/19 0001      Ambulation/Gait   Ambulation Distance (Feet)  100 Feet    Assistive device  None    Gait Pattern  Step-through pattern;Decreased  stance time - right;Decreased arm swing - left;Decreased weight shift to right    Ambulation Surface  Level;Indoor    Gait velocity  decreased     Gait Comments  VC for increased toe flexion and weight shift Rt during toe off;  VC to narrow base of support and bring RLE underneath her.  Improved quality with increased distance and cues.       Manual Therapy   Manual therapy comments  pt prone    Soft tissue mobilization  STM to Rt gastroc / soleus and around Rt Achilles tendon;  ice massage performed afterward for 3 min to Achilles.       Ankle Exercises: Stretches   Soleus Stretch  3 reps;30 seconds   Rt; seated with strap   Gastroc Stretch  3 reps;20 seconds   standing     Ankle Exercises: Seated   Ankle Circles/Pumps  20 reps;Right    Other Seated Ankle Exercises  Rt eversion/ inversion/ DF with red band x 20 reps each      Ankle Exercises: Aerobic   Nustep  L4: 5.5 min       Ankle Exercises: Standing   SLS  1 rep on RLE - 22 seconds without UE support     Heel Raises  Both;5 reps    Toe Raise  5 reps    Other  Standing Ankle Exercises  Forward step up with RLE x 12 reps on 6" step;  forward step downs with Lt foot on 4" step x 10 reps (multiple cues for form);  side stepping leading with RLE up / down 12 steps with BUE support;  forward step down reciprocal pattern on 6" step with Rt rail x 14 (challenging on RLE)     Other Standing Ankle Exercises  ankle circles in between stairs and SLS.                   PT Long Term Goals - 04/11/19 1229      PT LONG TERM GOAL #1   Title  independent with HEP    Status  On-going      PT LONG TERM GOAL #2   Title  improve Rt ankle strength to at least 4/5 for improved function    Status  Partially Met      PT LONG TERM GOAL #3   Title  FOTO score improved to </= 27% limited for improved function    Status  On-going      PT LONG TERM GOAL #4   Title  demonstrate improved RLE strength and balance by performing RLE SLS > 10 sec without LOB    Status  Achieved      PT LONG TERM GOAL #5   Title  report ability to walk/stand > 3 hours with pain < 4/10 for improved function and mobility.    Status  On-going            Plan - 04/11/19 1228    Clinical Impression Statement  Pt demonstrated improved Rt ankle strength.  She was able to tolerate short trial of stairs; difficulty descending stairs with LLE.  Improved SLS to 22 seconds.  Pt progressing well towards remaining goals.     PT Frequency  2x / week    PT Duration  6 weeks    PT Treatment/Interventions  ADLs/Self Care Home Management;Cryotherapy;Electrical Stimulation;Iontophoresis 9m/ml Dexamethasone;Moist Heat;Ultrasound;Neuromuscular re-education;Therapeutic exercise;Therapeutic activities;Patient/family education;Manual techniques;Passive range of motion;Vasopneumatic Device;Taping;Dry needling;Functional mobility training;Stair training;Gait training  PT Next Visit Plan  work on gait and mechanics,  work on functional ankle strength including stairs.     Consulted and Agree with  Plan of Care  Patient       Patient will benefit from skilled therapeutic intervention in order to improve the following deficits and impairments:  Abnormal gait, Increased fascial restricitons, Pain, Decreased mobility, Decreased range of motion, Decreased strength, Impaired flexibility, Difficulty walking, Decreased balance  Visit Diagnosis: Pain in right ankle and joints of right foot  Stiffness of right ankle, not elsewhere classified  Other abnormalities of gait and mobility  Muscle weakness (generalized)     Problem List Patient Active Problem List   Diagnosis Date Noted  . Pain of left calf 08/07/2018  . Achilles tendinitis of right lower extremity 04/27/2018  . Plantar fasciitis of right foot 03/09/2018  . Bilateral ankle pain 09/06/2017  . Varicose veins of leg with swelling, bilateral 07/27/2017  . Venous insufficiency of both lower extremities 07/04/2017  . Right shoulder pain 03/03/2017  . Macular dystrophy 01/12/2017  . Left elbow pain 11/09/2016  . Class 3 obesity due to excess calories without serious comorbidity with body mass index (BMI) of 40.0 to 44.9 in adult 11/02/2016  . GERD (gastroesophageal reflux disease) 07/10/2015  . Geographic tongue 04/28/2015  . Oral thrush 04/28/2015  . Sarcoidosis 04/09/2015  . Hyperglycemia 04/08/2015  . Hypokalemia 03/31/2015  . Constipation 03/19/2015  . Abdominal pain, epigastric 02/18/2015  . Hypothyroidism 02/18/2015  . HTN (hypertension) 02/18/2015  . Bilateral knee pain 05/12/2011   Kerin Perna, PTA 04/11/19 12:29 PM  [place], [place], [address] Phone: [phone]   Fax:  [phone]  Name: MARYLOUISE MALLET MRN: [id number] Date of Birth: 1971/01/21

## 2019-04-16 ENCOUNTER — Other Ambulatory Visit: Payer: Self-pay

## 2019-04-16 ENCOUNTER — Ambulatory Visit (INDEPENDENT_AMBULATORY_CARE_PROVIDER_SITE_OTHER): Payer: BLUE CROSS/BLUE SHIELD | Admitting: Physical Therapy

## 2019-04-16 DIAGNOSIS — M25571 Pain in right ankle and joints of right foot: Secondary | ICD-10-CM | POA: Diagnosis not present

## 2019-04-16 DIAGNOSIS — R2689 Other abnormalities of gait and mobility: Secondary | ICD-10-CM

## 2019-04-16 DIAGNOSIS — M6281 Muscle weakness (generalized): Secondary | ICD-10-CM

## 2019-04-16 DIAGNOSIS — M25671 Stiffness of right ankle, not elsewhere classified: Secondary | ICD-10-CM | POA: Diagnosis not present

## 2019-04-16 NOTE — Therapy (Addendum)
[facility] [place], [place], [address] Phone: [phone]   Fax:  [phone]  Physical Therapy Treatment/Discharge  Patient Details  Name: MICHELE JUDY MRN: [id number] Date of Birth: 06/21/1971 Referring Provider (PT): [name], MD   Encounter Date: 04/16/2019  PT End of Session - 04/16/19 0917    Visit Number  5    Number of Visits  12    Date for PT Re-Evaluation  05/06/19    PT Start Time  0911   pt arrived late   PT Stop Time  1000    PT Time Calculation (min)  49 min    Activity Tolerance  Patient tolerated treatment well    Behavior During Therapy  [facility] for tasks assessed/performed       Past Medical History:  Diagnosis Date  . Chronic pain of both knees   . Gallstones   . GERD (gastroesophageal reflux disease)   . Hypertension   . Obesity   . Sarcoidosis   . Thyroid disease     Past Surgical History:  Procedure Laterality Date  . ABDOMINAL HYSTERECTOMY    . CHOLECYSTECTOMY  03/12/15   [facility] [place]  . EXCISION HAGLUND'S DEFORMITY WITH ACHILLES TENDON REPAIR Right 01/10/2019   Procedure: Right Achilles tendon debridement, Excision of Haglund;  Surgeon: [name], MD;  Location: [place];  Service: Orthopedics;  Laterality: Right;  5mn  . GASTROC RECESSION EXTREMITY  01/10/2019   Procedure: GASTROC RECESSION EXTREMITY;  Surgeon: HWylene Simmer MD;  [place]  Service: Orthopedics;;  . KNEE SURGERY    . right knee surgery      There were no vitals filed for this visit.  Subjective Assessment - 04/16/19 1152    Subjective  Pt reports she is still living with her mom and hasn't done any stairs since last visit.  She states she is sitting around a lot at home, but has been performing ice massage on Rt Achilles tendon on a daily basis.      Currently in Pain?  Yes    Pain Score  2     Pain Location  Heel    Pain  Orientation  Right    Pain Descriptors / Indicators  Sore    Aggravating Factors   standing too long; walking increased distance    Pain Relieving Factors  rest, ice          [facility] - 04/16/19 0001      Assessment   Medical Diagnosis  Rt achilles tendon reconstruction    Referring Provider (PT)  JWylene Simmer MD    Onset Date/Surgical Date  01/10/19    Hand Dominance  Right    Next MD Visit  04/17/19      Observation/Other Assessments   Focus on Therapeutic Outcomes (FOTO)   30% limited       AROM   Right/Left Ankle  Right    Right Ankle Dorsiflexion  5    Right Ankle Plantar Flexion  69    Right Ankle Inversion  36    Right Ankle Eversion  24      Strength   Right/Left Ankle  Right    Right Ankle Dorsiflexion  4+/5    Right Ankle Plantar Flexion  2+/5   can complete partial heel raise in standing   Right Ankle Inversion  4+/5   with discomfort   Right Ankle Eversion  4+/5  [facility] Adult PT Treatment/Exercise - 04/16/19 0001      Ambulation/Gait   Ambulation Distance (Feet)  100 Feet    Assistive device  None    Gait Pattern  Step-through pattern;Decreased stance time - right;Decreased arm swing - left;Decreased weight shift to right    Ambulation Surface  Level;Indoor    Gait velocity  decreased     Gait Comments  VC for increased Rt DF during heel strike and increased toe flexion and weight shift Rt during toe off;  VC to narrow base of support and bring RLE underneath her.  Improved quality with increased distance and cues.       Self-Care   Other Self-Care Comments   reviewed self massage with roller stick; pt returned demo to Rt thigh and calf with cues      Manual Therapy   Manual therapy comments  pt prone    Soft tissue mobilization  STM to Rt gastroc / soleus and around Rt Achilles tendon;  ice massage performed afterward for 3 min to Achilles.       Ankle Exercises: Stretches   Soleus Stretch  2 reps;20 seconds    standing, each leg   Gastroc Stretch  3 reps;20 seconds   standing, each leg   Other Stretch  seated quad stretch for RLE x 20 sec x 4 reps       Ankle Exercises: Standing   Heel Raises  Both;10 reps   2 sets; one set with heels off edge of step   Toe Raise  10 reps    Other Standing Ankle Exercises  forward step downs with LLE on 4" step x 10; forward steps with RLE 6" x 10 reps; lateral step ups on 6" (RLE) x 10 with BUE support; reciprocal step pattern on 4-6" step x 12 steps      Ankle Exercises: Aerobic   Nustep  L4: 5.5 min       Ankle Exercises: Seated   Ankle Circles/Pumps  20 reps;Right    Other Seated Ankle Exercises  sit to stand with Rt foot back, x 10 reps             PT Education - 04/16/19 1154    Education Details  self massage; ice parameters; importance of edema reduction;  HEP - stairs, walking program, light heel raises and calf stretches.     Person(s) Educated  Patient    Methods  Explanation    Comprehension  Verbalized understanding          PT Long Term Goals - 04/16/19 0943      PT LONG TERM GOAL #1   Title  independent with HEP    Status  On-going      PT LONG TERM GOAL #2   Title  improve Rt ankle strength to at least 4/5 for improved function    Status  Partially Met      PT LONG TERM GOAL #3   Title  FOTO score improved to </= 27% limited for improved function    Status  On-going      PT LONG TERM GOAL #4   Title  demonstrate improved RLE strength and balance by performing RLE SLS > 10 sec without LOB    Status  Achieved      PT LONG TERM GOAL #5   Title  report ability to walk/stand > 3 hours with pain < 4/10 for improved function and mobility.    Baseline  04/16/19: pt  has not yet attempted.    Status  On-going            Plan - 04/16/19 1150    Clinical Impression Statement  Pt demonstrated improved exercise tolerance and gait pattern since last visit.  Pt complained of increased R knee pain with stairs; decreased  pain with seated quad stretch.  Pt's FOTO score has improved since intake.  Pt gradually progressing towards established goals. Pt requests to hold therapy until upcoming MD appt. Encouraged pt to look into graduated compression socks to assist with edema managment when returning to work.      Rehab Potential  Good    PT Frequency  2x / week    PT Duration  6 weeks    PT Treatment/Interventions  ADLs/Self Care Home Management;Cryotherapy;Electrical Stimulation;Iontophoresis 37m/ml Dexamethasone;Moist Heat;Ultrasound;Neuromuscular re-education;Therapeutic exercise;Therapeutic activities;Patient/family education;Manual techniques;Passive range of motion;Vasopneumatic Device;Taping;Dry needling;Functional mobility training;Stair training;Gait training    PT Next Visit Plan  continue to work on functional R ankle/ RLE strengthening exercises to assist with return to work    [employer] Agree with Plan of Care  Patient       Patient will benefit from skilled therapeutic intervention in order to improve the following deficits and impairments:  Abnormal gait, Increased fascial restricitons, Pain, Decreased mobility, Decreased range of motion, Decreased strength, Impaired flexibility, Difficulty walking, Decreased balance  Visit Diagnosis: Pain in right ankle and joints of right foot  Stiffness of right ankle, not elsewhere classified  Other abnormalities of gait and mobility  Muscle weakness (generalized)     Problem List Patient Active Problem List   Diagnosis Date Noted  . Pain of left calf 08/07/2018  . Achilles tendinitis of right lower extremity 04/27/2018  . Plantar fasciitis of right foot 03/09/2018  . Bilateral ankle pain 09/06/2017  . Varicose veins of leg with swelling, bilateral 07/27/2017  . Venous insufficiency of both lower extremities 07/04/2017  . Right shoulder pain 03/03/2017  . Macular dystrophy 01/12/2017  . Left elbow pain 11/09/2016  . Class 3 obesity due to  excess calories without serious comorbidity with body mass index (BMI) of 40.0 to 44.9 in adult 11/02/2016  . GERD (gastroesophageal reflux disease) 07/10/2015  . Geographic tongue 04/28/2015  . Oral thrush 04/28/2015  . Sarcoidosis 04/09/2015  . Hyperglycemia 04/08/2015  . Hypokalemia 03/31/2015  . Constipation 03/19/2015  . Abdominal pain, epigastric 02/18/2015  . Hypothyroidism 02/18/2015  . HTN (hypertension) 02/18/2015  . Bilateral knee pain 05/12/2011   JKerin Perna PTA 04/16/19 12:01 PM  [facility] [address] [address] [address]Phone: [phone]  Fax:  [phone] Name: CMELITA VILLALONAMRN: [id number] of Birth: [birth year]   PHYSICAL THERAPY DISCHARGE SUMMARY  Visits from Start of Care: 5  Current functional level related to goals / functional outcomes: See above   Remaining deficits: See above   Education / Equipment: HEP  Plan: Patient agrees to discharge.  Patient goals were partially met. Patient is being discharged due to not returning since the last visit.  ?????     SLaureen Abrahams PT, DPT 06/10/19 10:01 AM  [facility] [place] [phone](office) [phone](fax)

## 2019-05-01 ENCOUNTER — Telehealth: Payer: Self-pay | Admitting: Pulmonary Disease

## 2019-05-01 DIAGNOSIS — D869 Sarcoidosis, unspecified: Secondary | ICD-10-CM

## 2019-05-01 NOTE — Telephone Encounter (Signed)
Pt is calling back [phone]

## 2019-05-01 NOTE — Telephone Encounter (Signed)
Primary Pulmonologist: RA  Last office visit and with whom: 02/28/19 with TP What do we see them for (pulmonary problems): Sarcoidosis Last OV assessment/plan: Sarcoidosis Appear stable without flare  Continue with eye check up.  Activity as tolerated.  Influenza vaccine utd.  Follow up in 6 months and As needed    Class 3 obesity due to excess calories without serious comorbidity with body mass index (BMI) of 40.0 to 44.9 in adult [facility]) Weight loss    Was appointment offered to patient (explain)?  Yes, patient wants to wait on recommendations before scheduling either a televisit or in office visit.    Reason for call: Spoke with patient. She stated that both eyes have been bothering her for the past 3 days. She contacted her eye doctor yesterday and she was advised to use baby shampoo and artificial tears for relief. She is still having pain and blurred vision. She has also developed a mild headache. She wants to know if this is possible a sarcoidosis flare up and what should she do. Denied any breathing concerns. She has recently returned back to work after being on leave for 3 months. Denies being around anyone with COVID19. Also denied any fevers.   Beth, please advise. Thanks!

## 2019-05-01 NOTE — Telephone Encounter (Signed)
Left message for patient to call back  

## 2019-05-01 NOTE — Telephone Encounter (Signed)
Spoke with patient. She is aware of Beth's recommendations and verbalized understanding. Order for lab has been placed. Since patient lives in [place] and has been the [facility] location before, I ordered her labs to be done at the [facility] location in order to prevent her from having to travel to [place] just for labs. She verbalized understanding of where to go.   Nothing further needed at time of call.

## 2019-05-01 NOTE — Telephone Encounter (Signed)
It could be related to her sarcoidosis, needs to follow up with her opthamologist for this. We can check an ACE level in the mean time to make sure she is not flaring. Please order

## 2019-05-02 ENCOUNTER — Other Ambulatory Visit: Payer: Self-pay

## 2019-05-02 ENCOUNTER — Other Ambulatory Visit (INDEPENDENT_AMBULATORY_CARE_PROVIDER_SITE_OTHER): Payer: Self-pay

## 2019-05-02 DIAGNOSIS — D869 Sarcoidosis, unspecified: Secondary | ICD-10-CM

## 2019-05-03 ENCOUNTER — Telehealth: Payer: Self-pay | Admitting: Pulmonary Disease

## 2019-05-03 ENCOUNTER — Other Ambulatory Visit: Payer: Self-pay | Admitting: Family Medicine

## 2019-05-03 LAB — ANGIOTENSIN CONVERTING ENZYME: Angiotensin-Converting Enzyme: 69 U/L — ABNORMAL HIGH (ref 9–67)

## 2019-05-03 NOTE — Telephone Encounter (Signed)
Spoke with pt and gave results of ACE lab and BW recommendations.  Nothing further is needed.

## 2019-05-08 ENCOUNTER — Telehealth: Payer: Self-pay | Admitting: Pulmonary Disease

## 2019-05-08 NOTE — Telephone Encounter (Signed)
Elizabeth Riggs is there anyway you can call and see if she can get an apt. She has eye pain and blurred vision, needs opthalmology eval. From what I understand no respiratory symptoms. Concern for ocular sarcoidosis or something else.

## 2019-05-08 NOTE — Telephone Encounter (Signed)
Called Dr. Amalia Hailey' office and spoke with Santiago Glad in regards to why pt was needing to have an eval. Stated to her the info per [name] and Santiago Glad did state to me that pt does have an appt scheduled today at 2:10 and they have the main reason for the exam being her sarcoid. Routing to [employer] as an Pharmacist, hospital. Nothing further needed.

## 2019-05-08 NOTE — Telephone Encounter (Signed)
Great, thank you. If anything further needed let me know. If she develops cough, shortness of breath or chest pain have her come in to office.

## 2019-05-08 NOTE — Telephone Encounter (Signed)
This really needs to be addressed with her ophthalmologist or PCP. We do not treat eye pain or blurred vision. I do not have any recommendations. Advise ED if vision is acutely worse and she can not be seen.

## 2019-05-08 NOTE — Telephone Encounter (Signed)
Called pt but was unable to reach her. I left pt a detailed message in regards to the info stated by Derl Barrow, NP. Nothing further needed.

## 2019-05-08 NOTE — Telephone Encounter (Signed)
Primary Pulmonologist: RA Last office visit and with whom: 02/28/2019 with TP What do we see them for (pulmonary problems): sarcoidosis Last OV assessment/plan: Instructions   Return in about 6 months (around 08/31/2019) for Follow up with Dr. Elsworth Soho.  Continue on current regimen .  Follow up with Dr. Elsworth Soho  Or Parrett NP in 6 months and As needed   Please contact office for sooner follow up if symptoms do not improve or worsen or seek emergency care         Was appointment offered to patient (explain)?  Pt wants recommendations   Reason for call: called and spoke with pt. Pt was supposed to be seen today at 2:10 by ophthalmologist due to her vision but due to recently being out of work due to surgery, pt's insurance was dropped and therefore she was not seen by ophthalmogist.  Due to her insurance being dropped, pt had to go to her job to fill out a form to be able to get insurance back and pt stated that she will now be able to have insurance but has to wait to hear from advocate to let her know when her insurance will be in effect.  Pt stated she will call the person who handles insurance at her job to see when her insurance will be in effect. Pt stated that she has not been able to reschedule appt with ophthalmologist yet due to this.  Pt is wanting to know if there is any suggestions we could give her as she is having a lot of pain in her eyes and also is having blurry vision.  She stated when she had originally gone to the ophthalmologist, they told her that she had to pay for the entire visit prior to be able to receiving medication and she stated she did not have money to cover it all and they would not prescribe anything without a visit. Pt stated if she had to, she would go to ER to be evaluated but she wanted to know if we could give her any recommendations.  Beth, please advise on this for pt. Thanks!  (examples of things to ask: : When did symptoms start? Fever? Cough? Productive?  Color to sputum? More sputum than usual? Wheezing? Have you needed increased oxygen? Are you taking your respiratory medications? What over the counter measures have you tried?)

## 2019-05-08 NOTE — Telephone Encounter (Signed)
Notes recorded by Martyn Ehrich, NP on 05/03/2019 at 1:41 PM EDT ACE lab was mildly elevated, could indicated possible flare of sarcoidosis. I would call opthamologist back and let them know. They may want to do an exam, also sometimes corticosteroids are prescribed. If she doesn't get anywhere with them tell her to call me back.   Called and spoke with pt in regards to the lab results. Pt stated she called her eye doctor in regards to her needing to come to be examined based on her lab results and they were confused why she was needing to come in so she decided to call us back to see if we might be able to call them to give more info. Pt's eye doctor is Dr. Amalia Hailey and can be reached at [phone]. Beth, please advise on this for pt. Thanks!

## 2019-05-09 DIAGNOSIS — M6701 Short Achilles tendon (acquired), right ankle: Secondary | ICD-10-CM | POA: Diagnosis not present

## 2019-05-14 DIAGNOSIS — H16143 Punctate keratitis, bilateral: Secondary | ICD-10-CM | POA: Diagnosis not present

## 2019-05-21 DIAGNOSIS — Z713 Dietary counseling and surveillance: Secondary | ICD-10-CM | POA: Diagnosis not present

## 2019-05-22 DIAGNOSIS — H16143 Punctate keratitis, bilateral: Secondary | ICD-10-CM | POA: Diagnosis not present

## 2019-05-28 DIAGNOSIS — H16143 Punctate keratitis, bilateral: Secondary | ICD-10-CM | POA: Diagnosis not present

## 2019-05-30 ENCOUNTER — Ambulatory Visit (INDEPENDENT_AMBULATORY_CARE_PROVIDER_SITE_OTHER): Payer: BC Managed Care – PPO | Admitting: Adult Health

## 2019-05-30 ENCOUNTER — Other Ambulatory Visit: Payer: Self-pay

## 2019-05-30 ENCOUNTER — Encounter: Payer: Self-pay | Admitting: Adult Health

## 2019-05-30 ENCOUNTER — Ambulatory Visit (INDEPENDENT_AMBULATORY_CARE_PROVIDER_SITE_OTHER): Payer: BC Managed Care – PPO

## 2019-05-30 VITALS — BP 138/78 | HR 86 | Temp 97.9°F | Ht 65.0 in | Wt 256.8 lb

## 2019-05-30 DIAGNOSIS — D869 Sarcoidosis, unspecified: Secondary | ICD-10-CM | POA: Diagnosis not present

## 2019-05-30 LAB — CBC WITH DIFFERENTIAL/PLATELET
Basophils Absolute: 0.1 10*3/uL (ref 0.0–0.1)
Basophils Relative: 0.9 % (ref 0.0–3.0)
Eosinophils Absolute: 0.3 10*3/uL (ref 0.0–0.7)
Eosinophils Relative: 5 % (ref 0.0–5.0)
HCT: 38 % (ref 36.0–46.0)
Hemoglobin: 12.5 g/dL (ref 12.0–15.0)
Lymphocytes Relative: 30.8 % (ref 12.0–46.0)
Lymphs Abs: 1.8 10*3/uL (ref 0.7–4.0)
MCHC: 32.9 g/dL (ref 30.0–36.0)
MCV: 86.2 fl (ref 78.0–100.0)
Monocytes Absolute: 0.5 10*3/uL (ref 0.1–1.0)
Monocytes Relative: 9.4 % (ref 3.0–12.0)
Neutro Abs: 3.1 10*3/uL (ref 1.4–7.7)
Neutrophils Relative %: 53.9 % (ref 43.0–77.0)
Platelets: 291 10*3/uL (ref 150.0–400.0)
RBC: 4.41 Mil/uL (ref 3.87–5.11)
RDW: 16.5 % — ABNORMAL HIGH (ref 11.5–15.5)
WBC: 5.8 10*3/uL (ref 4.0–10.5)

## 2019-05-30 LAB — HEPATIC FUNCTION PANEL
ALT: 12 U/L (ref 0–35)
AST: 17 U/L (ref 0–37)
Albumin: 3.9 g/dL (ref 3.5–5.2)
Alkaline Phosphatase: 110 U/L (ref 39–117)
Bilirubin, Direct: 0 mg/dL (ref 0.0–0.3)
Total Bilirubin: 0.3 mg/dL (ref 0.2–1.2)
Total Protein: 7.8 g/dL (ref 6.0–8.3)

## 2019-05-30 MED ORDER — PREDNISONE 10 MG PO TABS: ORAL_TABLET | ORAL | 0 refills | Status: DC

## 2019-05-30 NOTE — Progress Notes (Signed)
@Patient  ID: Elizabeth Riggs, female    DOB: 1971-03-13, 48 y.o.   MRN: [id number]  Chief Complaint  Patient presents with  . 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 She presented in 02/2015 with weight loss, intractable nausea and vomiting Had lap chole 03/12/15 and was told that she had a growth on her GB  Pathology showed non-caseating granulomas.   TEST/EVENTS :  Chest x-ray 12/2014 showed right hilar prominence which was new compared to 05/2014 CT chest 03/2015 - mediastinal lymphadenopathy-subcarinal and precarinal, scattered subcentimeter nodules CT abdomen from 2010 -clear bases PFTs 04/2015 -nml  Echo 08/2015 showed nml EF,Mild LVH c/w HTN  Spirometry11/2018shows slightly decreased to stable lung function with FVC at 70%, FEV1 71%, ratio 83.  Chest x-ray August 2019 mild chronic reticular changes notable in the bases Spirometry August 2019 FEV1 73%, ratio 84, FVC 70%-no significant change since 2018  05/30/2019 Follow up : Sarcoid  Patient presents for a 25-month follow-up.  Patient has underlying sarcoidosis.  She says she is doing well with her breathing.  She denies any increased cough shortness of breath or wheezing.  However she has been having significant trouble with her vision over the last 2 to 3 months.  She has been to her ophthalmologist and is recently been referred to a eye specialist.  She says that her vision has declined quite a bit she has trouble reading and trouble with distance.  She did come in for lab work last month that showed a's level at 69.  Patient denies any rash abdominal pain nausea vomiting diarrhea. No cough, joint pain.   Allergies  Allergen Reactions  . Morphine And Related Itching  . Lisinopril Other (See Comments) and Rash    headache    Immunization History  Administered Date(s) Administered  . Influenza Split 12/23/2016, 12/26/2017, 07/26/2018     Past Medical History:  Diagnosis Date  . Chronic pain of both knees   . Gallstones   . GERD (gastroesophageal reflux disease)   . Hypertension   . Obesity   . Sarcoidosis   . Thyroid disease     Tobacco History: Social History   Tobacco Use  Smoking Status Never Smoker  Smokeless Tobacco Never Used   Counseling given: Not Answered   Outpatient Medications Prior to Visit  Medication Sig Dispense Refill  . ibuprofen (ADVIL,MOTRIN) 400 MG tablet Take 400 mg by mouth every 6 (six) hours as needed for moderate pain.    Marland Kitchen levothyroxine (SYNTHROID, LEVOTHROID) 125 MCG tablet Take 1 tablet (125 mcg total) by mouth daily. 30 tablet 0  . omeprazole (PRILOSEC) 20 MG capsule Take 1 capsule (20 mg total) by mouth daily. 30 capsule 0  . aspirin EC 81 MG tablet Take 1 tablet (81 mg total) by mouth 2 (two) times daily. (Patient not taking: Reported on 03/25/2019) 84 tablet 0  . docusate sodium (COLACE) 100 MG capsule Take 1 capsule (100 mg total) by mouth 2 (two) times daily. While taking narcotic pain medicine. (Patient not taking: Reported on 05/30/2019) 30 capsule 0  . methocarbamol (ROBAXIN) 500 MG tablet TAKE 1 TABLET (500 MG TOTAL) BY MOUTH EVERY 8 (EIGHT) HOURS AS NEEDED. (Patient not taking: Reported on 05/30/2019) 60 tablet 1  . senna (SENOKOT) 8.6 MG TABS tablet Take 2 tablets (17.2 mg total) by mouth 2 (two) times daily. (Patient not taking: Reported on 05/30/2019) 30 each  0   No facility-administered medications prior to visit.      Review of Systems:   Constitutional:   No  weight loss, night sweats,  Fevers, chills, fatigue, or  lassitude.  HEENT:   No headaches,  Difficulty swallowing,  Tooth/dental problems, or  Sore throat,                No sneezing, itching, ear ache, nasal congestion, post nasal drip,  Positive visual changes  CV:  No chest pain,  Orthopnea, PND, swelling in lower extremities, anasarca, dizziness, palpitations, syncope.   GI  No heartburn, indigestion,  abdominal pain, nausea, vomiting, diarrhea, change in bowel habits, loss of appetite, bloody stools.   Resp: No shortness of breath with exertion or at rest.  No excess mucus, no productive cough,  No non-productive cough,  No coughing up of blood.  No change in color of mucus.  No wheezing.  No chest wall deformity  Skin: no rash or lesions.  GU: no dysuria, change in color of urine, no urgency or frequency.  No flank pain, no hematuria   MS:  No joint pain or swelling.  No decreased range of motion.  No back pain.    Physical Exam  BP 138/78 (BP Location: Left Arm, Cuff Size: Large)   Pulse 86   Temp 97.9 F (36.6 C) (Oral)   Ht 5\' 5"  (1.651 m)   Wt 256 lb 12.8 oz (116.5 kg)   SpO2 97%   BMI 42.73 kg/m   GEN: A/Ox3; pleasant , NAD, obese   HEENT:  [place]/AT,  EACs-clear, TMs-wnl, NOSE-clear, THROAT-clear, no lesions, no postnasal drip or exudate noted.   NECK:  Supple w/ fair ROM; no JVD; normal carotid impulses w/o bruits; no thyromegaly or nodules palpated; no lymphadenopathy.    RESP  Clear  P & A; w/o, wheezes/ rales/ or rhonchi. no accessory muscle use, no dullness to percussion  CARD:  RRR, no m/r/g, no peripheral edema, pulses intact, no cyanosis or clubbing.  GI:   Soft & nt; nml bowel sounds; no organomegaly or masses detected.   Musco: Warm bil, no deformities or joint swelling noted.   Neuro: alert, no focal deficits noted.    Skin: Warm, no lesions or rashes    Lab Results:  CBC  BNP No results found for: BNP  ProBNP   Imaging: Dg Chest 2 View  Result Date: 05/30/2019 CLINICAL DATA:  Sarcoidosis, follow-up EXAM: CHEST - 2 VIEW COMPARISON:  08/07/2018 FINDINGS: Mild chronic interstitial prominence throughout the lungs, similar to prior study. Heart and mediastinal contours are within normal limits. No focal opacities or effusions. No acute bony abnormality. IMPRESSION: Stable chronic interstitial prominence throughout the lungs. No active  cardiopulmonary disease. Electronically Signed   By: Rolm Baptise M.D.   On: 05/30/2019 11:46      PFT Results Latest Ref Rng & Units 05/12/2015  FVC-Pre L 2.73  FVC-Predicted Pre % 84  FVC-Post L 2.60  FVC-Predicted Post % 80  Pre FEV1/FVC % % 87  Post FEV1/FCV % % 88  FEV1-Pre L 2.36  FEV1-Predicted Pre % 89  FEV1-Post L 2.30  DLCO UNC% % 84  DLCO COR %Predicted % 125  TLC L 3.80  TLC % Predicted % 71  RV % Predicted % 62    No results found for: NITRICOXIDE      Assessment & Plan:   Sarcoidosis No active pulmonary symptoms. Visual changes may be secondary to underlying active sarcoid.  ACE level is slightly elevated. Discussed that she should follow-up with ophthalmology as recommended and has upcoming new referral for another specialist. For now we will treat with a short course of steroids.  Patient education given.  She will have close follow-up back in the office in 3 weeks. Will check LFTs and chest x-ray today  Plan  Patient Instructions  Labs today .  Chest xray today  Begin Prednisone 20 mg daily for 2 weeks and then 10mg  daily .  Follow up with Ophthalmology as planned  Follow up in office in 3 weeks with Dr. Elsworth Soho  Or Yasamin Karel NP and As needed   Please contact office for sooner follow up if symptoms do not improve or worsen or seek emergency care          Rexene Edison, NP 05/30/2019

## 2019-05-30 NOTE — Assessment & Plan Note (Signed)
No active pulmonary symptoms. Visual changes may be secondary to underlying active sarcoid.  ACE level is slightly elevated. Discussed that she should follow-up with ophthalmology as recommended and has upcoming new referral for another specialist. For now we will treat with a short course of steroids.  Patient education given.  She will have close follow-up back in the office in 3 weeks. Will check LFTs and chest x-ray today  Plan  Patient Instructions  Labs today .  Chest xray today  Begin Prednisone 20 mg daily for 2 weeks and then 10mg  daily .  Follow up with Ophthalmology as planned  Follow up in office in 3 weeks with Dr. Elsworth Soho  Or Parrett NP and As needed   Please contact office for sooner follow up if symptoms do not improve or worsen or seek emergency care

## 2019-05-30 NOTE — Patient Instructions (Addendum)
Labs today .  Chest xray today  Begin Prednisone 20 mg daily for 2 weeks and then 10mg  daily .  Follow up with Ophthalmology as planned  Follow up in office in 3 weeks with Dr. Elsworth Soho  Or Benjamim Harnish NP and As needed   Please contact office for sooner follow up if symptoms do not improve or worsen or seek emergency care

## 2019-06-05 DIAGNOSIS — D869 Sarcoidosis, unspecified: Secondary | ICD-10-CM | POA: Diagnosis not present

## 2019-06-07 ENCOUNTER — Telehealth: Payer: Self-pay | Admitting: Adult Health

## 2019-06-07 NOTE — Telephone Encounter (Signed)
Called [facility] back on [phone] pharmacist wanted to confirm the directions given because it read as 2 tablets daily for 2 weeks then 10 tablet daily.   She wanted to confirm the patient was not supposed to be taking 100 mg daily after the 20 mg daily for 2 weeks. Advised that based on the ov note the patient was to only take two 10 mg tablets daily and then one 10 mg daily. Confirmed the prescription will be issued to the patient.  Called the patient to advise she would be able to pick up the prescription issued 05/30/19 today. The patient said that her eye doctor also gave her prednisone eye drops and wanted to know if it was ok for her to take them with the tablets issued by Tammy.  I told the patient that she needs to discuss that directly with the pharmacist as we do not know the dosage of the eye drops given. She said the eye doctor wanted her to call us because they needed some information or had a question but the patient said she was confused because there are so many doctors that she sees.  I asked her to contact the eye doctor's office directly telling them to call us in order for them to directly clarify what questions or needs they may have that need to be addressed. Patient voiced understanding. Nothing further needed at this time.

## 2019-06-09 DIAGNOSIS — M6701 Short Achilles tendon (acquired), right ankle: Secondary | ICD-10-CM | POA: Diagnosis not present

## 2019-06-10 DIAGNOSIS — D869 Sarcoidosis, unspecified: Secondary | ICD-10-CM | POA: Diagnosis not present

## 2019-06-10 DIAGNOSIS — H16143 Punctate keratitis, bilateral: Secondary | ICD-10-CM | POA: Diagnosis not present

## 2019-06-19 DIAGNOSIS — R7303 Prediabetes: Secondary | ICD-10-CM | POA: Diagnosis not present

## 2019-06-19 DIAGNOSIS — E039 Hypothyroidism, unspecified: Secondary | ICD-10-CM | POA: Diagnosis not present

## 2019-06-19 DIAGNOSIS — I10 Essential (primary) hypertension: Secondary | ICD-10-CM | POA: Diagnosis not present

## 2019-06-19 DIAGNOSIS — D869 Sarcoidosis, unspecified: Secondary | ICD-10-CM | POA: Diagnosis not present

## 2019-06-20 ENCOUNTER — Encounter: Payer: Self-pay | Admitting: Adult Health

## 2019-06-20 ENCOUNTER — Other Ambulatory Visit: Payer: Self-pay

## 2019-06-20 ENCOUNTER — Ambulatory Visit (INDEPENDENT_AMBULATORY_CARE_PROVIDER_SITE_OTHER): Payer: BC Managed Care – PPO | Admitting: Adult Health

## 2019-06-20 DIAGNOSIS — H539 Unspecified visual disturbance: Secondary | ICD-10-CM | POA: Diagnosis not present

## 2019-06-20 DIAGNOSIS — D869 Sarcoidosis, unspecified: Secondary | ICD-10-CM

## 2019-06-20 MED ORDER — PREDNISONE 10 MG PO TABS: ORAL_TABLET | ORAL | 1 refills | Status: DC

## 2019-06-20 NOTE — Assessment & Plan Note (Signed)
Visual changes etiology unclear - clinical improvement on steroids ? Sarcoid related.  Cont follow up with Ophthalmology  Will continue on prednisone 10mg  for few weeks and then see back in office    Plan  Patient Instructions  Decrease Prednisone 10mg  daily .  Set up for HRCT chest in 6 weeks .  Follow up in 6 weeks with Dr. Elsworth Soho  Or Parrett NP with PFT (Spirometry with DLCO)  Please contact office for sooner follow up if symptoms do not improve or worsen or seek emergency care      '

## 2019-06-20 NOTE — Patient Instructions (Addendum)
Decrease Prednisone 10mg  daily .  Set up for HRCT chest in 6 weeks .  Follow up in 6 weeks with Dr. Elsworth Soho  Or Parrett NP with PFT (Spirometry with DLCO)  Please contact office for sooner follow up if symptoms do not improve or worsen or seek emergency care

## 2019-06-20 NOTE — Progress Notes (Signed)
@Patient  ID: Elizabeth Riggs, female    DOB: May 26, 1971, 48 y.o.   MRN: [id number]  Chief Complaint  Patient presents with  . 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 She presented in 02/2015 with weight loss, intractable nausea and vomiting Had lap chole 03/12/15 and was told that she had a growth on her GB  Pathology showed non-caseating granulomas.   TEST/EVENTS :  Chest x-ray 12/2014 showed right hilar prominence which was new compared to 05/2014 CT chest 03/2015 - mediastinal lymphadenopathy-subcarinal and precarinal, scattered subcentimeter nodules CT abdomen from 2010 -clear bases PFTs 04/2015 -nml  Echo 08/2015 showed nml EF,Mild LVH c/w HTN  Spirometry11/2018shows slightly decreased to stable lung function with FVC at 70%, FEV1 71%, ratio 83. Chest x-ray August 2019 mild chronic reticular changes notable in the bases Spirometry August 2019 FEV1 73%, ratio 84, FVC 70%-no significant change since 2018  06/20/2019 Follow up Sarcoid  Patient presents for a 3-week follow-up.  Patient has underlying sarcoidosis.  Seen last visit with worsening visual changes.  Lab work showed a's level was slightly elevated at 69 .   She has been referred to another ophthalmologist for second opinion.  She was started on prednisone 20 mg daily for 2 weeks then 10 mg daily Chest x-ray showed stable chronic interstitial changes.Marland Kitchen LFTs were normal.  Since last visit patient does feel vision has improved slightly . Saw [facility] last week.  Wants to start her on Acthar. Did not get definite diagnosis .   No cough or dyspnea . No rash .  Was having trouble seeing long distance and blurry vision . This has improved since last visit .    Allergies  Allergen Reactions  . Morphine And Related Itching  . Lisinopril Other (See Comments) and Rash    headache    Immunization History  Administered Date(s)  Administered  . Influenza Split 12/23/2016, 12/26/2017, 07/26/2018    Past Medical History:  Diagnosis Date  . Chronic pain of both knees   . Gallstones   . GERD (gastroesophageal reflux disease)   . Hypertension   . Obesity   . Sarcoidosis   . Thyroid disease     Tobacco History: Social History   Tobacco Use  Smoking Status Never Smoker  Smokeless Tobacco Never Used   Counseling given: Not Answered   Outpatient Medications Prior to Visit  Medication Sig Dispense Refill  . ibuprofen (ADVIL,MOTRIN) 400 MG tablet Take 400 mg by mouth every 6 (six) hours as needed for moderate pain.    Marland Kitchen levothyroxine (SYNTHROID, LEVOTHROID) 125 MCG tablet Take 1 tablet (125 mcg total) by mouth daily. 30 tablet 0  . omeprazole (PRILOSEC) 20 MG capsule Take 1 capsule (20 mg total) by mouth daily. 30 capsule 0  . predniSONE (DELTASONE) 10 MG tablet Take 2 tablets daily for 2 weeks, then 10 tablet daily. 50 tablet 0   No facility-administered medications prior to visit.      Review of Systems:   Constitutional:   No  weight loss, night sweats,  Fevers, chills, + fatigue, or  lassitude.  HEENT:   No headaches,  Difficulty swallowing,  Tooth/dental problems, or  Sore throat,                No sneezing, itching, ear ache, nasal congestion, post nasal drip, +vision changes   CV:  No chest pain,  Orthopnea, PND,  swelling in lower extremities, anasarca, dizziness, palpitations, syncope.   GI  No heartburn, indigestion, abdominal pain, nausea, vomiting, diarrhea, change in bowel habits, loss of appetite, bloody stools.   Resp: No shortness of breath with exertion or at rest.  No excess mucus, no productive cough,  No non-productive cough,  No coughing up of blood.  No change in color of mucus.  No wheezing.  No chest wall deformity  Skin: no rash or lesions.  GU: no dysuria, change in color of urine, no urgency or frequency.  No flank pain, no hematuria   MS:  No joint pain or swelling.  No  decreased range of motion.  No back pain.    Physical Exam  BP 116/78 (BP Location: Left Arm, Patient Position: Sitting, Cuff Size: Normal)   Pulse 80   Temp 97.8 F (36.6 C)   Ht 5\' 5"  (1.651 m)   Wt 255 lb 12.8 oz (116 kg)   SpO2 98%   BMI 42.57 kg/m   GEN: A/Ox3; pleasant , NAD, well nourished    HEENT:  [place]/AT,  EACs-clear, TMs-wnl, NOSE-clear, THROAT-clear, no lesions, no postnasal drip or exudate noted.   NECK:  Supple w/ fair ROM; no JVD; normal carotid impulses w/o bruits; no thyromegaly or nodules palpated; no lymphadenopathy.    RESP  Clear  P & A; w/o, wheezes/ rales/ or rhonchi. no accessory muscle use, no dullness to percussion  CARD:  RRR, no m/r/g, no peripheral edema, pulses intact, no cyanosis or clubbing.  GI:   Soft & nt; nml bowel sounds; no organomegaly or masses detected.   Musco: Warm bil, no deformities or joint swelling noted.   Neuro: alert, no focal deficits noted.    Skin: Warm, no lesions or rashes    Lab Results:  CBC  BMET  Imaging: Dg Chest 2 View  Result Date: 05/30/2019 CLINICAL DATA:  Sarcoidosis, follow-up EXAM: CHEST - 2 VIEW COMPARISON:  08/07/2018 FINDINGS: Mild chronic interstitial prominence throughout the lungs, similar to prior study. Heart and mediastinal contours are within normal limits. No focal opacities or effusions. No acute bony abnormality. IMPRESSION: Stable chronic interstitial prominence throughout the lungs. No active cardiopulmonary disease. Electronically Signed   By: Rolm Baptise M.D.   On: 05/30/2019 11:46      PFT Results Latest Ref Rng & Units 05/12/2015  FVC-Pre L 2.73  FVC-Predicted Pre % 84  FVC-Post L 2.60  FVC-Predicted Post % 80  Pre FEV1/FVC % % 87  Post FEV1/FCV % % 88  FEV1-Pre L 2.36  FEV1-Predicted Pre % 89  FEV1-Post L 2.30  DLCO UNC% % 84  DLCO COR %Predicted % 125  TLC L 3.80  TLC % Predicted % 71  RV % Predicted % 62    No results found for: NITRICOXIDE      Assessment &  Plan:   Sarcoidosis No active pulmonary symptoms.  Unclear if eye symptoms are sarcoid related but has clinical improvement with steroids.  Will continue prednisone 10mg  . Check HRCT chest and Spirometry with DLCO on return.   Plan  Patient Instructions  Decrease Prednisone 10mg  daily .  Set up for HRCT chest in 6 weeks .  Follow up in 6 weeks with Dr. Elsworth Soho  Or Parrett NP with PFT (Spirometry with DLCO)  Please contact office for sooner follow up if symptoms do not improve or worsen or seek emergency care         Visual changes Visual changes etiology unclear -  clinical improvement on steroids ? Sarcoid related.  Cont follow up with Ophthalmology  Will continue on prednisone 10mg  for few weeks and then see back in office    Plan  Patient Instructions  Decrease Prednisone 10mg  daily .  Set up for HRCT chest in 6 weeks .  Follow up in 6 weeks with Dr. Elsworth Soho  Or Parrett NP with PFT (Spirometry with DLCO)  Please contact office for sooner follow up if symptoms do not improve or worsen or seek emergency care      '       Tammy Parrett, NP 06/20/2019

## 2019-06-20 NOTE — Assessment & Plan Note (Signed)
No active pulmonary symptoms.  Unclear if eye symptoms are sarcoid related but has clinical improvement with steroids.  Will continue prednisone 10mg  . Check HRCT chest and Spirometry with DLCO on return.   Plan  Patient Instructions  Decrease Prednisone 10mg  daily .  Set up for HRCT chest in 6 weeks .  Follow up in 6 weeks with Dr. Elsworth Soho  Or Herbert Aguinaldo NP with PFT (Spirometry with DLCO)  Please contact office for sooner follow up if symptoms do not improve or worsen or seek emergency care

## 2019-06-20 NOTE — Addendum Note (Signed)
Addended by: Joella Prince on: 06/20/2019 10:41 AM   Modules accepted: Orders

## 2019-06-24 DIAGNOSIS — Z79899 Other long term (current) drug therapy: Secondary | ICD-10-CM | POA: Diagnosis not present

## 2019-06-24 DIAGNOSIS — I10 Essential (primary) hypertension: Secondary | ICD-10-CM | POA: Diagnosis not present

## 2019-06-24 DIAGNOSIS — E039 Hypothyroidism, unspecified: Secondary | ICD-10-CM | POA: Diagnosis not present

## 2019-06-24 DIAGNOSIS — R7303 Prediabetes: Secondary | ICD-10-CM | POA: Diagnosis not present

## 2019-06-24 DIAGNOSIS — D869 Sarcoidosis, unspecified: Secondary | ICD-10-CM | POA: Diagnosis not present

## 2019-06-27 DIAGNOSIS — K625 Hemorrhage of anus and rectum: Secondary | ICD-10-CM | POA: Diagnosis not present

## 2019-06-30 ENCOUNTER — Other Ambulatory Visit: Payer: Self-pay | Admitting: Adult Health

## 2019-07-09 DIAGNOSIS — M6701 Short Achilles tendon (acquired), right ankle: Secondary | ICD-10-CM | POA: Diagnosis not present

## 2019-07-16 ENCOUNTER — Telehealth: Payer: Self-pay | Admitting: Adult Health

## 2019-07-17 NOTE — Telephone Encounter (Signed)
Called and left message on pt vm to call back to schedule PFT -pr  °

## 2019-07-17 NOTE — Telephone Encounter (Signed)
Elizabeth Riggs aware pt needs PFT scheduling.  

## 2019-07-18 NOTE — Telephone Encounter (Signed)
Called and left messge on pt vm to call back to schedule pft. 3rd attempt - closed encounter -pr

## 2019-07-23 ENCOUNTER — Other Ambulatory Visit: Payer: Self-pay | Admitting: Pulmonary Disease

## 2019-07-30 ENCOUNTER — Other Ambulatory Visit (HOSPITAL_COMMUNITY): Payer: BC Managed Care – PPO | Attending: Pulmonary Disease

## 2019-07-31 ENCOUNTER — Other Ambulatory Visit (HOSPITAL_BASED_OUTPATIENT_CLINIC_OR_DEPARTMENT_OTHER): Payer: Self-pay | Admitting: Adult Health

## 2019-07-31 ENCOUNTER — Other Ambulatory Visit: Payer: Self-pay | Admitting: Adult Health

## 2019-07-31 ENCOUNTER — Other Ambulatory Visit: Payer: Self-pay

## 2019-07-31 ENCOUNTER — Encounter (HOSPITAL_BASED_OUTPATIENT_CLINIC_OR_DEPARTMENT_OTHER): Payer: Self-pay

## 2019-07-31 ENCOUNTER — Ambulatory Visit (HOSPITAL_BASED_OUTPATIENT_CLINIC_OR_DEPARTMENT_OTHER)
Admission: RE | Admit: 2019-07-31 | Discharge: 2019-07-31 | Disposition: A | Discharge status: Home / Self Care | Payer: BC Managed Care – PPO | Source: Ambulatory Visit | Attending: Adult Health | Admitting: Adult Health

## 2019-07-31 ENCOUNTER — Ambulatory Visit (HOSPITAL_BASED_OUTPATIENT_CLINIC_OR_DEPARTMENT_OTHER): Admission: RE | Admit: 2019-07-31 | Payer: BC Managed Care – PPO | Source: Ambulatory Visit

## 2019-07-31 DIAGNOSIS — D869 Sarcoidosis, unspecified: Secondary | ICD-10-CM

## 2019-07-31 DIAGNOSIS — Z87891 Personal history of nicotine dependence: Secondary | ICD-10-CM

## 2019-07-31 DIAGNOSIS — J8410 Pulmonary fibrosis, unspecified: Secondary | ICD-10-CM | POA: Diagnosis not present

## 2019-08-01 ENCOUNTER — Ambulatory Visit: Payer: BC Managed Care – PPO | Admitting: Adult Health

## 2019-08-06 ENCOUNTER — Other Ambulatory Visit: Payer: Self-pay | Admitting: Pulmonary Disease

## 2019-08-09 DIAGNOSIS — M6701 Short Achilles tendon (acquired), right ankle: Secondary | ICD-10-CM | POA: Diagnosis not present

## 2019-08-23 ENCOUNTER — Telehealth: Payer: Self-pay | Admitting: Adult Health

## 2019-08-23 ENCOUNTER — Other Ambulatory Visit (HOSPITAL_COMMUNITY)
Admission: RE | Admit: 2019-08-23 | Discharge: 2019-08-23 | Disposition: A | Discharge status: Home / Self Care | Payer: BC Managed Care – PPO | Source: Ambulatory Visit | Attending: Pulmonary Disease | Admitting: Pulmonary Disease

## 2019-08-23 NOTE — Telephone Encounter (Signed)
ATC patient unable to reach LM to call back, no fax sent over at this time.

## 2019-08-23 NOTE — Telephone Encounter (Signed)
Received fax from patient's employer. We can not accept the swab.  Test was collected on 08/07/2019.  Called patient and let her know that she will need to go tomorrow and get testing or she will need to reschedule the PFT again.  Patient verbalized understanding.

## 2019-08-23 NOTE — Progress Notes (Signed)
Patient no show for 0930 am pre-procedure COVID appointment, left message for patient to return call if unable to make appointment today, reminded that the testing site closes at 3 pm today

## 2019-08-23 NOTE — Telephone Encounter (Signed)
Patient called back. Patient had testing today with work and they will fax over results.  I asked her to fax them to my direct office fax number at [phone] with attention to me.  Will await results.

## 2019-08-24 ENCOUNTER — Other Ambulatory Visit (HOSPITAL_COMMUNITY)
Admission: RE | Admit: 2019-08-24 | Discharge: 2019-08-24 | Disposition: A | Discharge status: Home / Self Care | Payer: BC Managed Care – PPO | Source: Ambulatory Visit | Attending: Pulmonary Disease | Admitting: Pulmonary Disease

## 2019-08-24 DIAGNOSIS — Z20828 Contact with and (suspected) exposure to other viral communicable diseases: Secondary | ICD-10-CM | POA: Insufficient documentation

## 2019-08-24 DIAGNOSIS — Z01812 Encounter for preprocedural laboratory examination: Secondary | ICD-10-CM | POA: Diagnosis not present

## 2019-08-24 LAB — SARS CORONAVIRUS 2 (TAT 6-24 HRS): SARS Coronavirus 2: NEGATIVE

## 2019-08-27 ENCOUNTER — Other Ambulatory Visit: Payer: Self-pay

## 2019-08-27 ENCOUNTER — Ambulatory Visit (INDEPENDENT_AMBULATORY_CARE_PROVIDER_SITE_OTHER): Payer: BC Managed Care – PPO | Admitting: Pulmonary Disease

## 2019-08-27 ENCOUNTER — Ambulatory Visit (INDEPENDENT_AMBULATORY_CARE_PROVIDER_SITE_OTHER): Payer: BC Managed Care – PPO | Admitting: Adult Health

## 2019-08-27 ENCOUNTER — Encounter: Payer: Self-pay | Admitting: Adult Health

## 2019-08-27 DIAGNOSIS — D869 Sarcoidosis, unspecified: Secondary | ICD-10-CM

## 2019-08-27 DIAGNOSIS — H539 Unspecified visual disturbance: Secondary | ICD-10-CM

## 2019-08-27 LAB — PULMONARY FUNCTION TEST
DL/VA % pred: 145 %
DL/VA: 6.21 ml/min/mmHg/L
DLCO unc % pred: 89 %
DLCO unc: 20.24 ml/min/mmHg
FEF 25-75 Post: 2.74 L/sec
FEF 25-75 Pre: 2.25 L/sec
FEF2575-%Change-Post: 21 %
FEF2575-%Pred-Post: 102 %
FEF2575-%Pred-Pre: 84 %
FEV1-%Change-Post: 2 %
FEV1-%Pred-Post: 78 %
FEV1-%Pred-Pre: 76 %
FEV1-Post: 2 L
FEV1-Pre: 1.95 L
FEV1FVC-%Change-Post: 5 %
FEV1FVC-%Pred-Pre: 102 %
FEV6-%Change-Post: -2 %
FEV6-%Pred-Post: 73 %
FEV6-%Pred-Pre: 75 %
FEV6-Post: 2.26 L
FEV6-Pre: 2.32 L
FEV6FVC-%Pred-Post: 102 %
FEV6FVC-%Pred-Pre: 102 %
FVC-%Change-Post: -2 %
FVC-%Pred-Post: 71 %
FVC-%Pred-Pre: 73 %
FVC-Post: 2.26 L
FVC-Pre: 2.32 L
Post FEV1/FVC ratio: 88 %
Post FEV6/FVC ratio: 100 %
Pre FEV1/FVC ratio: 84 %
Pre FEV6/FVC Ratio: 100 %
RV % pred: 61 %
RV: 1.14 L
TLC % pred: 65 %
TLC: 3.5 L

## 2019-08-27 NOTE — Patient Instructions (Addendum)
Follow up with eye doctor in next 1-2 weeks.  Decrease prednisone 10mg  1/2 daily for 2 weeks and then 1/2 every other day for 2 weeks and stop .  Follow up with Dr. Elsworth Soho  Or Parrett NP 6-8 weeks and As needed   Please contact office for sooner follow up if symptoms do not improve or worsen or seek emergency care

## 2019-08-27 NOTE — Progress Notes (Signed)
@Patient  ID: Elizabeth Riggs, female    DOB: [birth year], 48 y.o.   MRN: [id number]  Chief Complaint  Patient presents with  . 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 She presented in 02/2015 with weight loss, intractable nausea and vomiting Had lap chole 03/12/15 and was told that she had a growth on her GB  Pathology showed non-caseating granulomas.  TEST/EVENTS :  Chest x-ray 12/2014 showed right hilar prominence which was new compared to 05/2014 CT chest 03/2015 - mediastinal lymphadenopathy-subcarinal and precarinal, scattered subcentimeter nodules CT abdomen from 2010 -clear bases PFTs 04/2015 -nml  Echo 08/2015 showed nml EF,Mild LVH c/w HTN  Spirometry11/2018shows slightly decreased to stable lung function with FVC at 70%, FEV1 71%, ratio 83. Chest x-ray August 2019 mild chronic reticular changes notable in the bases Spirometry August 2019 FEV1 73%, ratio 84, FVC 70%-no significant change since 2018     08/27/2019 follow-up sarcoidosis Patient returns for a 69-month follow-up.  She has underlying sarcoidosis diagnosed in 2016. Over the last several months has had worsening visual changes.  She has been following with an ophthalmologist.  She was started on oral steroids with perceived clinical benefit.  She had decreased pain pressure and improved vision.  High-resolution CT chest was done on July 31, 2019 that showed pulmonary parenchymal findings of sarcoidosis has decreased since 2016.  Some minimal fibrosis in the lower lungs.  Pulmonary function testing done today shows improved lung function with an FEV1 at 78%, ratio is 88, FVC 71%, DLCO 89%. 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  She says she is very careful.  She wears a mask and shield.  Says overall her breathing has been doing well.  She denies any flare of cough or wheezing.  No significant shortness of  breath. She says she does need to schedule a follow-up with ophthalmology.     Allergies  Allergen Reactions  . Morphine And Related Itching  . Lisinopril Other (See Comments) and Rash    headache    Immunization History  Administered Date(s) Administered  . Influenza Split 12/23/2016, 12/26/2017, 07/26/2018    Past Medical History:  Diagnosis Date  . Chronic pain of both knees   . Gallstones   . GERD (gastroesophageal reflux disease)   . Hypertension   . Obesity   . Sarcoidosis   . Thyroid disease     Tobacco History: Social History   Tobacco Use  Smoking Status Never Smoker  Smokeless Tobacco Never Used   Counseling given: Not Answered   Outpatient Medications Prior to Visit  Medication Sig Dispense Refill  . ibuprofen (ADVIL,MOTRIN) 400 MG tablet Take 400 mg by mouth every 6 (six) hours as needed for moderate pain.    Marland Kitchen levothyroxine (SYNTHROID, LEVOTHROID) 125 MCG tablet Take 1 tablet (125 mcg total) by mouth daily. 30 tablet 0  . omeprazole (PRILOSEC) 20 MG capsule Take 1 capsule (20 mg total) by mouth daily. 30 capsule 0  . predniSONE (DELTASONE) 10 MG tablet Take one tablet daily 30 tablet 1   No facility-administered medications prior to visit.      Review of Systems:   Constitutional:   No  weight loss, night sweats,  Fevers, chills, fatigue, or  lassitude.  HEENT:   No headaches,  Difficulty swallowing,  Tooth/dental problems, or  Sore throat,  No sneezing, itching, ear ache, nasal congestion, post nasal drip,  +eye pressure   CV:  No chest pain,  Orthopnea, PND, swelling in lower extremities, anasarca, dizziness, palpitations, syncope.   GI  No heartburn, indigestion, abdominal pain, nausea, vomiting, diarrhea, change in bowel habits, loss of appetite, bloody stools.   Resp: No shortness of breath with exertion or at rest.  No excess mucus, no productive cough,  No non-productive cough,  No coughing up of blood.  No change in  color of mucus.  No wheezing.  No chest wall deformity  Skin: no rash or lesions.  GU: no dysuria, change in color of urine, no urgency or frequency.  No flank pain, no hematuria   MS:  No joint pain or swelling.  No decreased range of motion.  No back pain.    Physical Exam  BP 120/70 (BP Location: Left Arm, Patient Position: Sitting, Cuff Size: Large)   Pulse 81   Temp (!) 97.3 F (36.3 C)   Ht 5\' 6"  (1.676 m)   Wt 263 lb (119.3 kg)   SpO2 95%   BMI 42.45 kg/m   GEN: A/Ox3; pleasant , NAD, well nourished    HEENT:  [place]/AT,  EACs-clear, TMs-wnl, NOSE-clear, THROAT-clear, no lesions, no postnasal drip or exudate noted.   NECK:  Supple w/ fair ROM; no JVD; normal carotid impulses w/o bruits; no thyromegaly or nodules palpated; no lymphadenopathy.    RESP  Clear  P & A; w/o, wheezes/ rales/ or rhonchi. no accessory muscle use, no dullness to percussion  CARD:  RRR, no m/r/g, no peripheral edema, pulses intact, no cyanosis or clubbing.  GI:   Soft & nt; nml bowel sounds; no organomegaly or masses detected.   Musco: Warm bil, no deformities or joint swelling noted.   Neuro: alert, no focal deficits noted.    Skin: Warm, no lesions or rashes    Lab Results:  CBC    Component Value Date/Time   WBC 5.8 05/30/2019 1026   RBC 4.41 05/30/2019 1026   HGB 12.5 05/30/2019 1026   HCT 38.0 05/30/2019 1026   PLT 291.0 05/30/2019 1026   MCV 86.2 05/30/2019 1026   MCH 28.2 03/09/2017 1336   MCHC 32.9 05/30/2019 1026   RDW 16.5 (H) 05/30/2019 1026   LYMPHSABS 1.8 05/30/2019 1026   MONOABS 0.5 05/30/2019 1026   EOSABS 0.3 05/30/2019 1026   BASOSABS 0.1 05/30/2019 1026    BMET    Component Value Date/Time   NA 139 02/13/2018 1516   K 4.0 02/13/2018 1516   CL 104 02/13/2018 1516   CO2 33 (H) 02/13/2018 1516   GLUCOSE 104 (H) 02/13/2018 1516   BUN 11 02/13/2018 1516   CREATININE 0.92 02/13/2018 1516   CREATININE 0.78 01/08/2016 1540   CALCIUM 9.3 02/13/2018 1516    GFRNONAA >60 03/09/2017 1336   GFRAA >60 03/09/2017 1336    BNP No results found for: BNP  ProBNP    Component Value Date/Time   PROBNP 12.0 02/13/2018 1516    Imaging: Ct Chest High Resolution  Result Date: 07/31/2019 CLINICAL DATA:  Sarcoidosis, presenting for routine follow-up. EXAM: CT CHEST WITHOUT CONTRAST TECHNIQUE: Multidetector CT imaging of the chest was performed following the standard protocol without intravenous contrast. High resolution imaging of the lungs, as well as inspiratory and expiratory imaging, was performed. COMPARISON:  04/01/2015 chest CT. FINDINGS: Cardiovascular: Normal heart size. No significant pericardial effusion/thickening. Normal course and caliber of the thoracic aorta. Top-normal caliber main  pulmonary artery (3.3 cm diameter), increased from 2.9 cm. Mediastinum/Nodes: No discrete thyroid nodules. Unremarkable esophagus. No pathologically enlarged axillary, mediastinal or hilar lymph nodes, noting limited sensitivity for the detection of hilar adenopathy on this noncontrast study. Lungs/Pleura: No pneumothorax. No pleural effusion. No significant air trapping on the expiration sequence. No acute consolidative airspace disease or lung masses. There is minimal patchy subpleural reticulation and fine nodularity along the fissures and subpleural lungs bilaterally, most prominent in the mid to lower lungs. These findings are decreased compared to the 04/01/2015 chest CT study. No residual measurable pulmonary nodules. No new significant pulmonary nodules. There is minimal architectural distortion and minimal traction bronchiolectasis in lower lungs, which is increased from prior. No frank honeycombing. Upper abdomen: No acute abnormality. Musculoskeletal: No aggressive appearing focal osseous lesions. Mild thoracic spondylosis. IMPRESSION: Pulmonary parenchymal findings of sarcoidosis have overall decreased since 2016 chest CT, although with some interval development of  minimal fibrosis in the lower lungs. Thoracic adenopathy has resolved. Electronically Signed   By: Ilona Sorrel M.D.   On: 07/31/2019 10:35      PFT Results Latest Ref Rng & Units 08/27/2019 05/12/2015  FVC-Pre L 2.32 2.73  FVC-Predicted Pre % 73 84  FVC-Post L 2.26 2.60  FVC-Predicted Post % 71 80  Pre FEV1/FVC % % 84 87  Post FEV1/FCV % % 88 88  FEV1-Pre L 1.95 2.36  FEV1-Predicted Pre % 76 89  FEV1-Post L 2.00 2.30  DLCO UNC% % 89 84  DLCO COR %Predicted % 145 125  TLC L 3.50 3.80  TLC % Predicted % 65 71  RV % Predicted % 61 62    No results found for: NITRICOXIDE      Assessment & Plan:   Sarcoidosis Sarcoidosis-pulmonary involvement appears to be stable.  Patient's lung function is improved and CT chest shows decreased parenchymal involvement with mild basilar fibrosis. There is concern for possible eye involvement she is to continue with ophthalmology follow-up.  She is currently on steroids and will begin to taper slowly off.  Plan  Patient Instructions  Follow up with eye doctor in next 1-2 weeks.  Decrease prednisone 10mg  1/2 daily for 2 weeks and then 1/2 every other day for 2 weeks and stop .  Follow up with Dr. Elsworth Soho  Or Niclas Markell NP 6-8 weeks and As needed   Please contact office for sooner follow up if symptoms do not improve or worsen or seek emergency care       Visual changes Ongoing visual changes.  Patient is to make a follow-up appointment with ophthalmology this week.     Rexene Edison, NP 08/27/2019

## 2019-08-27 NOTE — Assessment & Plan Note (Signed)
Sarcoidosis-pulmonary involvement appears to be stable.  Patient's lung function is improved and CT chest shows decreased parenchymal involvement with mild basilar fibrosis. There is concern for possible eye involvement she is to continue with ophthalmology follow-up.  She is currently on steroids and will begin to taper slowly off.  Plan  Patient Instructions  Follow up with eye doctor in next 1-2 weeks.  Decrease prednisone 10mg  1/2 daily for 2 weeks and then 1/2 every other day for 2 weeks and stop .  Follow up with Dr. Elsworth Soho  Or Dohn Stclair NP 6-8 weeks and As needed   Please contact office for sooner follow up if symptoms do not improve or worsen or seek emergency care

## 2019-08-27 NOTE — Assessment & Plan Note (Signed)
Ongoing visual changes.  Patient is to make a follow-up appointment with ophthalmology this week.

## 2019-08-27 NOTE — Progress Notes (Signed)
PFT done today. 

## 2019-08-31 ENCOUNTER — Other Ambulatory Visit: Payer: Self-pay | Admitting: Adult Health

## 2019-09-09 DIAGNOSIS — I83893 Varicose veins of bilateral lower extremities with other complications: Secondary | ICD-10-CM | POA: Diagnosis not present

## 2019-09-09 DIAGNOSIS — I872 Venous insufficiency (chronic) (peripheral): Secondary | ICD-10-CM | POA: Diagnosis not present

## 2019-09-09 DIAGNOSIS — M6701 Short Achilles tendon (acquired), right ankle: Secondary | ICD-10-CM | POA: Diagnosis not present

## 2019-09-19 DIAGNOSIS — I872 Venous insufficiency (chronic) (peripheral): Secondary | ICD-10-CM | POA: Diagnosis not present

## 2019-09-19 DIAGNOSIS — I83893 Varicose veins of bilateral lower extremities with other complications: Secondary | ICD-10-CM | POA: Diagnosis not present

## 2019-10-09 ENCOUNTER — Ambulatory Visit: Payer: BC Managed Care – PPO | Admitting: Adult Health

## 2019-10-09 DIAGNOSIS — M6701 Short Achilles tendon (acquired), right ankle: Secondary | ICD-10-CM | POA: Diagnosis not present

## 2019-12-03 ENCOUNTER — Telehealth: Payer: Self-pay | Admitting: Adult Health

## 2019-12-03 NOTE — Telephone Encounter (Signed)
Even though she has sarcoidosis, her lung function has been good in the past. She should hydrate and check her oxygen saturation daily if she has a pulse oximeter, if saturation drops then she would need hospital admission. If she does not have a pulse oximeter, then we can go by symptoms and have her call back if she develops significant shortness of breath

## 2019-12-03 NOTE — Telephone Encounter (Signed)
Spoke with pt, states that she received a positive Covid test result yesterday.   Pt c/o chest tightness (pt unsure if this is breathing or anxiety related).  Denies any other symptoms on Covid screening list.  Pt is requesting recs, wants to know if there is anything she can take to keep her s/s from progressing.  Pt worries since she has sarcoidosis.    [facility] [address]   Dr. Elsworth Soho please advise on recs.  Thanks!

## 2019-12-03 NOTE — Telephone Encounter (Signed)
Spoke with pt, aware of recs.  Nothing further needed at this time- will close encounter.   

## 2019-12-11 DIAGNOSIS — R159 Full incontinence of feces: Secondary | ICD-10-CM | POA: Diagnosis not present

## 2019-12-11 DIAGNOSIS — U071 COVID-19: Secondary | ICD-10-CM | POA: Diagnosis not present

## 2019-12-11 DIAGNOSIS — D869 Sarcoidosis, unspecified: Secondary | ICD-10-CM | POA: Diagnosis not present

## 2019-12-11 DIAGNOSIS — R197 Diarrhea, unspecified: Secondary | ICD-10-CM | POA: Diagnosis not present

## 2019-12-12 DIAGNOSIS — D869 Sarcoidosis, unspecified: Secondary | ICD-10-CM | POA: Diagnosis not present

## 2019-12-12 DIAGNOSIS — R197 Diarrhea, unspecified: Secondary | ICD-10-CM | POA: Diagnosis not present

## 2019-12-12 DIAGNOSIS — R159 Full incontinence of feces: Secondary | ICD-10-CM | POA: Diagnosis not present

## 2019-12-16 DIAGNOSIS — R197 Diarrhea, unspecified: Secondary | ICD-10-CM | POA: Diagnosis not present

## 2019-12-16 DIAGNOSIS — D869 Sarcoidosis, unspecified: Secondary | ICD-10-CM | POA: Diagnosis not present

## 2019-12-16 DIAGNOSIS — U071 COVID-19: Secondary | ICD-10-CM | POA: Diagnosis not present

## 2019-12-24 DIAGNOSIS — K3184 Gastroparesis: Secondary | ICD-10-CM | POA: Diagnosis not present

## 2019-12-24 DIAGNOSIS — K921 Melena: Secondary | ICD-10-CM | POA: Diagnosis not present

## 2020-01-14 DIAGNOSIS — K921 Melena: Secondary | ICD-10-CM | POA: Diagnosis not present

## 2020-01-14 DIAGNOSIS — K5730 Diverticulosis of large intestine without perforation or abscess without bleeding: Secondary | ICD-10-CM | POA: Diagnosis not present

## 2020-03-06 DIAGNOSIS — H3554 Dystrophies primarily involving the retinal pigment epithelium: Secondary | ICD-10-CM | POA: Diagnosis not present

## 2020-05-17 ENCOUNTER — Emergency Department (HOSPITAL_BASED_OUTPATIENT_CLINIC_OR_DEPARTMENT_OTHER)
Admission: EM | Admit: 2020-05-17 | Discharge: 2020-05-17 | Disposition: A | Discharge status: Home / Self Care | Payer: BC Managed Care – PPO | Attending: Emergency Medicine | Admitting: Emergency Medicine

## 2020-05-17 ENCOUNTER — Other Ambulatory Visit: Payer: Self-pay

## 2020-05-17 DIAGNOSIS — R197 Diarrhea, unspecified: Secondary | ICD-10-CM | POA: Insufficient documentation

## 2020-05-17 DIAGNOSIS — E039 Hypothyroidism, unspecified: Secondary | ICD-10-CM | POA: Insufficient documentation

## 2020-05-17 DIAGNOSIS — I10 Essential (primary) hypertension: Secondary | ICD-10-CM | POA: Diagnosis not present

## 2020-05-17 DIAGNOSIS — E876 Hypokalemia: Secondary | ICD-10-CM | POA: Diagnosis not present

## 2020-05-17 DIAGNOSIS — R1084 Generalized abdominal pain: Secondary | ICD-10-CM | POA: Diagnosis not present

## 2020-05-17 DIAGNOSIS — Z79899 Other long term (current) drug therapy: Secondary | ICD-10-CM | POA: Diagnosis not present

## 2020-05-17 DIAGNOSIS — R9431 Abnormal electrocardiogram [ECG] [EKG]: Secondary | ICD-10-CM | POA: Diagnosis not present

## 2020-05-17 LAB — URINALYSIS, ROUTINE W REFLEX MICROSCOPIC
Bilirubin Urine: NEGATIVE
Glucose, UA: NEGATIVE mg/dL
Ketones, ur: NEGATIVE mg/dL
Leukocytes,Ua: NEGATIVE
Nitrite: NEGATIVE
Protein, ur: NEGATIVE mg/dL
Specific Gravity, Urine: 1.015 (ref 1.005–1.030)
pH: 6 (ref 5.0–8.0)

## 2020-05-17 LAB — COMPREHENSIVE METABOLIC PANEL
ALT: 18 U/L (ref 0–44)
AST: 20 U/L (ref 15–41)
Albumin: 3.5 g/dL (ref 3.5–5.0)
Alkaline Phosphatase: 97 U/L (ref 38–126)
Anion gap: 11 (ref 5–15)
BUN: 7 mg/dL (ref 6–20)
CO2: 30 mmol/L (ref 22–32)
Calcium: 8.3 mg/dL — ABNORMAL LOW (ref 8.9–10.3)
Chloride: 97 mmol/L — ABNORMAL LOW (ref 98–111)
Creatinine, Ser: 0.85 mg/dL (ref 0.44–1.00)
GFR calc Af Amer: >60 mL/min (ref >60)
GFR calc non Af Amer: >60 mL/min (ref >60)
Glucose, Bld: 102 mg/dL — ABNORMAL HIGH (ref 70–99)
Potassium: 2.5 mmol/L — CL (ref 3.5–5.1)
Sodium: 138 mmol/L (ref 135–145)
Total Bilirubin: 0.6 mg/dL (ref 0.3–1.2)
Total Protein: 7.8 g/dL (ref 6.5–8.1)

## 2020-05-17 LAB — URINALYSIS, MICROSCOPIC (REFLEX)

## 2020-05-17 LAB — LIPASE, BLOOD: Lipase: 61 U/L — ABNORMAL HIGH (ref 11–51)

## 2020-05-17 LAB — CBC WITH DIFFERENTIAL/PLATELET
Abs Immature Granulocytes: 0.01 10*3/uL (ref 0.00–0.07)
Basophils Absolute: 0 10*3/uL (ref 0.0–0.1)
Basophils Relative: 0 %
Eosinophils Absolute: 0.1 10*3/uL (ref 0.0–0.5)
Eosinophils Relative: 1 %
HCT: 38.3 % (ref 36.0–46.0)
Hemoglobin: 12.8 g/dL (ref 12.0–15.0)
Immature Granulocytes: 0 %
Lymphocytes Relative: 29 %
Lymphs Abs: 1.8 10*3/uL (ref 0.7–4.0)
MCH: 28.4 pg (ref 26.0–34.0)
MCHC: 33.4 g/dL (ref 30.0–36.0)
MCV: 84.9 fL (ref 80.0–100.0)
Monocytes Absolute: 0.7 10*3/uL (ref 0.1–1.0)
Monocytes Relative: 11 %
Neutro Abs: 3.5 10*3/uL (ref 1.7–7.7)
Neutrophils Relative %: 59 %
Platelets: 254 10*3/uL (ref 150–400)
RBC: 4.51 MIL/uL (ref 3.87–5.11)
RDW: 15.3 % (ref 11.5–15.5)
WBC: 6 10*3/uL (ref 4.0–10.5)
nRBC: 0 % (ref 0.0–0.2)

## 2020-05-17 MED ORDER — POTASSIUM CHLORIDE CRYS ER 20 MEQ PO TBCR
20.0000 meq | EXTENDED_RELEASE_TABLET | Freq: Every day | ORAL | 0 refills | Status: DC
Start: 2020-05-17 — End: 2022-10-23

## 2020-05-17 MED ORDER — SODIUM CHLORIDE 0.9 % IV BOLUS
1000.0000 mL | Freq: Once | INTRAVENOUS | Status: AC
  Administered 2020-05-17: 1000 mL via INTRAVENOUS

## 2020-05-17 MED ORDER — ONDANSETRON HCL 4 MG/2ML IJ SOLN
4.0000 mg | Freq: Once | INTRAMUSCULAR | Status: AC
  Administered 2020-05-17: 4 mg via INTRAVENOUS
  Filled 2020-05-17: qty 2

## 2020-05-17 MED ORDER — POTASSIUM CHLORIDE CRYS ER 20 MEQ PO TBCR
40.0000 meq | EXTENDED_RELEASE_TABLET | Freq: Once | ORAL | Status: AC
  Administered 2020-05-17: 40 meq via ORAL
  Filled 2020-05-17: qty 2

## 2020-05-17 MED ORDER — POTASSIUM CHLORIDE 10 MEQ/100ML IV SOLN
10.0000 meq | INTRAVENOUS | Status: AC
  Administered 2020-05-17 (×2): 10 meq via INTRAVENOUS
  Filled 2020-05-17 (×2): qty 100

## 2020-05-17 NOTE — ED Triage Notes (Signed)
Pt states began yesterday with n/v/d.  Works at skilled nursing facility

## 2020-05-17 NOTE — ED Notes (Signed)
ED Provider [place] at bedside.

## 2020-05-17 NOTE — ED Provider Notes (Signed)
[facility] Provider Note   CSN: [id number] Arrival date & time: 05/17/20  1444     History Chief Complaint  Patient presents with  . Abdominal Pain  . Diarrhea    Elizabeth Riggs is a 49 y.o. female.  The history is provided by the patient.  Abdominal Cramping This is a new problem. The current episode started yesterday. The problem has not changed since onset.Associated symptoms include abdominal pain (cramps). Pertinent negatives include no chest pain, no headaches and no shortness of breath. Nothing aggravates the symptoms. Nothing relieves the symptoms. She has tried nothing for the symptoms. The treatment provided no relief.       Past Medical History:  Diagnosis Date  . Chronic pain of both knees   . Gallstones   . GERD (gastroesophageal reflux disease)   . Hypertension   . Obesity   . Sarcoidosis   . Thyroid disease     Patient Active Problem List   Diagnosis Date Noted  . Visual changes 06/20/2019  . Pain of left calf 08/07/2018  . Achilles tendinitis of right lower extremity 04/27/2018  . Plantar fasciitis of right foot 03/09/2018  . Bilateral ankle pain 09/06/2017  . Varicose veins of leg with swelling, bilateral 07/27/2017  . Venous insufficiency of both lower extremities 07/04/2017  . Right shoulder pain 03/03/2017  . Macular dystrophy 01/12/2017  . Left elbow pain 11/09/2016  . Class 3 obesity due to excess calories without serious comorbidity with body mass index (BMI) of 40.0 to 44.9 in adult 11/02/2016  . GERD (gastroesophageal reflux disease) 07/10/2015  . Geographic tongue 04/28/2015  . Oral thrush 04/28/2015  . Sarcoidosis 04/09/2015  . Hyperglycemia 04/08/2015  . Hypokalemia 03/31/2015  . Constipation 03/19/2015  . Abdominal pain, epigastric 02/18/2015  . Hypothyroidism 02/18/2015  . HTN (hypertension) 02/18/2015  . Bilateral knee pain 05/12/2011    Past Surgical History:  Procedure Laterality Date  .  ABDOMINAL HYSTERECTOMY    . CHOLECYSTECTOMY  03/12/15   [facility] in [place]  . EXCISION HAGLUND'S DEFORMITY WITH ACHILLES TENDON REPAIR Right 01/10/2019   Procedure: Right Achilles tendon debridement, Excision of Haglund;  Surgeon: [name], MD;  Location: [facility];  Service: Orthopedics;  Laterality: Right;  39min  . GASTROC RECESSION EXTREMITY  01/10/2019   Procedure: GASTROC RECESSION EXTREMITY;  Surgeon: [name], MD;  Location: [facility];  Service: Orthopedics;;  . KNEE SURGERY    . right knee surgery       OB History   No obstetric history on file.     Family History  Problem Relation Age of Onset  . Hypertension Mother   . Diabetes Father   . Hypertension Father   . Cancer Maternal Grandmother        breast  . Cancer Maternal Grandfather        prostate  . Heart attack Neg Hx     Social History   Tobacco Use  . Smoking status: Never Smoker  . Smokeless tobacco: Never Used  Substance Use Topics  . Alcohol use: No    Alcohol/week: 0.0 standard drinks  . Drug use: No    Home Medications Prior to Admission medications   Medication Sig Start Date End Date Taking? Authorizing Provider  levothyroxine (SYNTHROID, LEVOTHROID) 125 MCG tablet Take 1 tablet (125 mcg total) by mouth daily. 03/23/17  Yes Parrett, Tammy S, NP  predniSONE (DELTASONE) 10 MG tablet TAKE 1 TABLET BY MOUTH  EVERY DAY 09/03/19  Yes Parrett, Tammy S, NP  ibuprofen (ADVIL,MOTRIN) 400 MG tablet Take 400 mg by mouth every 6 (six) hours as needed for moderate pain.    [provider]  omeprazole (PRILOSEC) 20 MG capsule Take 1 capsule (20 mg total) by mouth daily. 03/09/17   Malvin Johns, MD  potassium chloride SA (KLOR-CON) 20 MEQ tablet Take 1 tablet (20 mEq total) by mouth daily for 5 days. 05/17/20 05/22/20  Lennice Sites, DO    Allergies    Morphine and related and Lisinopril  Review of Systems   Review of Systems  Constitutional:  Negative for chills and fever.  HENT: Negative for ear pain and sore throat.   Eyes: Negative for pain and visual disturbance.  Respiratory: Negative for cough and shortness of breath.   Cardiovascular: Negative for chest pain and palpitations.  Gastrointestinal: Positive for abdominal pain (cramps), diarrhea and nausea. Negative for vomiting.  Genitourinary: Negative for dysuria and hematuria.  Musculoskeletal: Negative for arthralgias and back pain.  Skin: Negative for color change and rash.  Neurological: Negative for seizures, syncope and headaches.  All other systems reviewed and are negative.   Physical Exam Updated Vital Signs  ED Triage Vitals  Enc Vitals Group     BP 05/17/20 1452 (!) 157/93     Pulse Rate 05/17/20 1452 81     Resp 05/17/20 1452 18     Temp 05/17/20 1452 98.4 F (36.9 C)     Temp Source 05/17/20 1452 Oral     SpO2 05/17/20 1452 97 %     Weight 05/17/20 1455 245 lb (111.1 kg)     Height 05/17/20 1455 5\' 5"  (1.651 m)     Head Circumference --      Peak Flow --      Pain Score 05/17/20 1455 9     Pain Loc --      Pain Edu? --      Excl. in [place]? --     Physical Exam Vitals and nursing note reviewed.  Constitutional:      General: She is not in acute distress.    Appearance: She is well-developed. She is not ill-appearing.  HENT:     Head: Normocephalic and atraumatic.  Eyes:     Extraocular Movements: Extraocular movements intact.     Conjunctiva/sclera: Conjunctivae normal.     Pupils: Pupils are equal, round, and reactive to light.  Cardiovascular:     Rate and Rhythm: Normal rate and regular rhythm.     Heart sounds: Normal heart sounds. No murmur.  Pulmonary:     Effort: Pulmonary effort is normal. No respiratory distress.     Breath sounds: Normal breath sounds.  Abdominal:     General: There is no distension.     Palpations: Abdomen is soft.     Tenderness: There is generalized abdominal tenderness. There is no guarding or rebound.  Negative signs include Murphy's sign and Rovsing's sign.  Musculoskeletal:     Cervical back: Neck supple.  Skin:    General: Skin is warm and dry.     Capillary Refill: Capillary refill takes less than 2 seconds.  Neurological:     General: No focal deficit present.     Mental Status: She is alert.  Psychiatric:        Mood and Affect: Mood normal.     ED Results / Procedures / Treatments   Labs (all labs ordered are listed, but only abnormal results are  displayed) Labs Reviewed  COMPREHENSIVE METABOLIC PANEL - Abnormal; Notable for the following components:      Result Value   Potassium 2.5 (*)    Chloride 97 (*)    Glucose, Bld 102 (*)    Calcium 8.3 (*)    All other components within normal limits  LIPASE, BLOOD - Abnormal; Notable for the following components:   Lipase 61 (*)    All other components within normal limits  URINALYSIS, ROUTINE W REFLEX MICROSCOPIC - Abnormal; Notable for the following components:   Hgb urine dipstick TRACE (*)    All other components within normal limits  URINALYSIS, MICROSCOPIC (REFLEX) - Abnormal; Notable for the following components:   Bacteria, UA MANY (*)    All other components within normal limits  URINE CULTURE  CBC WITH DIFFERENTIAL/PLATELET    EKG EKG Interpretation  Date/Time:  Sunday May 17 2020 17:51:48 EDT Ventricular Rate:  79 PR Interval:    QRS Duration: 106 QT Interval:  429 QTC Calculation: 492 R Axis:   -36 Text Interpretation: Sinus rhythm Biatrial enlargement Left axis deviation RSR' in V1 or V2, probably normal variant Borderline T abnormalities, anterior leads Borderline prolonged QT interval Confirmed by Lennice Sites [phone]) on 05/17/2020 5:56:39 PM   Radiology No results found.  Procedures Procedures (including critical care time)  Medications Ordered in ED Medications  potassium chloride 10 mEq in 100 mL IVPB (10 mEq Intravenous New Bag/Given 05/17/20 1839)  sodium chloride 0.9 % bolus 1,000 mL  (1,000 mLs Intravenous New Bag/Given 05/17/20 1611)  ondansetron (ZOFRAN) injection 4 mg (4 mg Intravenous Given 05/17/20 1627)  potassium chloride SA (KLOR-CON) CR tablet 40 mEq (40 mEq Oral Given 05/17/20 1740)    ED Course  I have reviewed the triage vital signs and the nursing notes.  Pertinent labs & imaging results that were available during my care of the patient were reviewed by me and considered in my medical decision making (see chart for details).    MDM Rules/Calculators/A&P                      Elizabeth Riggs is a 49 year old female with no significant medical history presents the ED with nausea, vomiting, diarrhea, abdominal cramping.  Symptoms started yesterday.  Possibly after eating coleslaw.  Patient with unremarkable vitals.  No fever.  Works at [employer].  Still possibly exposure there.  Has some mild generalized abdominal tenderness on exam.  No rebound, no guarding.  No focal findings on exam to suggest appendicitis or cholecystitis.  However will check lab work.  Will give IV fluid bolus, IV Zofran.  Will check urinalysis.  Suspect viral or foodborne process.  Patient with potassium of 2.5 but otherwise unremarkable lab work.  No leukocytosis, no anemia.  Patient with overall negative urinalysis.  Has many bacteria.  Will send urine culture.  Possibly contamination.  Negative nitrites, no white blood cells.  Will give potassium repletion.  Patient given IV fluids and IV Zofran.  Likely foodborne or viral illness.  Will prescribe potassium for home.   This chart was dictated using voice recognition software.  Despite best efforts to proofread,  errors can occur which can change the documentation meaning.    Final Clinical Impression(s) / ED Diagnoses Final diagnoses:  Diarrhea, unspecified type  Hypokalemia    Rx / DC Orders ED Discharge Orders         Ordered    potassium chloride SA (KLOR-CON) 20 MEQ  tablet  Daily     05/17/20 1919            Lennice Sites, DO 05/17/20 1919

## 2020-05-17 NOTE — ED Notes (Signed)
Date and time results received: 05/17/20 1705   Test: potassium Critical Value: 2.5 Name of Provider Notified: no orders given Orders Received? Or Actions Taken?:  No orders given

## 2020-05-19 LAB — URINE CULTURE

## 2020-06-10 ENCOUNTER — Encounter: Payer: Self-pay | Admitting: Adult Health

## 2020-06-10 ENCOUNTER — Ambulatory Visit (INDEPENDENT_AMBULATORY_CARE_PROVIDER_SITE_OTHER): Payer: BC Managed Care – PPO

## 2020-06-10 ENCOUNTER — Other Ambulatory Visit: Payer: Self-pay

## 2020-06-10 ENCOUNTER — Ambulatory Visit (INDEPENDENT_AMBULATORY_CARE_PROVIDER_SITE_OTHER): Payer: BC Managed Care – PPO | Admitting: Adult Health

## 2020-06-10 VITALS — BP 140/90 | HR 78 | Ht 65.0 in | Wt 255.0 lb

## 2020-06-10 DIAGNOSIS — D869 Sarcoidosis, unspecified: Secondary | ICD-10-CM | POA: Diagnosis not present

## 2020-06-10 DIAGNOSIS — J209 Acute bronchitis, unspecified: Secondary | ICD-10-CM

## 2020-06-10 DIAGNOSIS — J90 Pleural effusion, not elsewhere classified: Secondary | ICD-10-CM | POA: Diagnosis not present

## 2020-06-10 MED ORDER — AZITHROMYCIN 250 MG PO TABS
ORAL_TABLET | ORAL | 0 refills | Status: AC
Start: 2020-06-10 — End: 2020-06-15

## 2020-06-10 MED ORDER — PREDNISONE 10 MG PO TABS: ORAL_TABLET | ORAL | 0 refills | Status: DC

## 2020-06-10 NOTE — Patient Instructions (Addendum)
Zpack take as directed.  Chest xray today  Saline nasal rinses As needed   Mucinex DM Twice daily  As needed  Cough/congestion.  Prednisone taper over next week.  Follow up with Dr. Elsworth Soho  In 3-4 months and As needed   Please contact office for sooner follow up if symptoms do not improve or worsen or seek emergency care

## 2020-06-10 NOTE — Progress Notes (Signed)
@Patient  ID: Elizabeth Riggs, female    DOB: Mar 23, 1971, 49 y.o.   MRN: [id number]  Chief Complaint  Patient presents with  . Follow-up    Sarcoid     Referring provider: Sinclair Ship, MD  HPI: 49 year old female never smoker followed for sarcoidosis Initial presentation was March 2016 with weight loss, intractable nausea and vomiting.  She underwent a lap cholecystectomy March 12, 2015 with gallbladder lesion noted.  Pathology showed noncaseating granulomas. 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 Chest x-ray August 2019 mild chronic reticular changes notable in the bases Spirometry August 2019 FEV1 73%, ratio 84, FVC 70%-no significant change since 2018  92020 FEV1 at 78%, ratio is 88, FVC 71%, DLCO 89%.  July 31, 2019 high-resolution CT chest -no pathologically enlarged mediastinal or hilar adenopathy.  Minimal patchy subpleural reticulation and fine nodularity along the fissures and subpleural lungs bilaterally this is decreased from previous study in 2016, normal distortion and minimal traction bronchiectasis lower lung slightly increased from prior   06/10/2020 follow-up: Sarcoidosis Patient presents for a follow-up visit she was last seen in September 2020.  Patient says she was doing fairly well until February 2021 when she developed COVID-19.  She reports she had a very mild case and recovered fully as far as she knows.  However over the last 2 weeks she has noticed that she has had nasal congestion sinus pressure postnasal drainage productive cough with clear to white mucus  increase shortness of breath and intermittent wheezing.  She denies any rash, visual changes, joint swelling hemoptysis abdominal pain orthopnea or edema.  She has not used any over-the-counter medications for treatment.  She denies any loss of taste or smell.  No fever or body aches. Last visit pulmonary function testing and CT chest showed stable disease. She has received both of her Covid vaccines.  Allergies  Allergen Reactions  . Morphine And Related Itching  . Lisinopril Other (See Comments) and Rash    headache    Immunization History  Administered Date(s) Administered  . Influenza Split 12/23/2016, 12/26/2017, 07/26/2018  . Influenza-Unspecified 12/23/2016, 12/26/2017, 07/26/2018  . PFIZER SARS-COV-2 Vaccination 01/28/2020, 02/18/2020    Past Medical History:  Diagnosis Date  . Chronic pain of both knees   . Gallstones   . GERD (gastroesophageal reflux disease)   . Hypertension   . Obesity   . Sarcoidosis   . Thyroid disease     Tobacco History: Social History   Tobacco Use  Smoking Status Never Smoker  Smokeless Tobacco Never Used   Counseling given: Not Answered   Outpatient Medications Prior to Visit  Medication Sig Dispense Refill  . ibuprofen (ADVIL,MOTRIN) 400 MG tablet Take 400 mg by mouth every 6 (six) hours as needed for moderate pain.    Marland Kitchen levothyroxine (SYNTHROID, LEVOTHROID) 125 MCG tablet Take 1 tablet (125 mcg total) by mouth daily. 30 tablet 0  . omeprazole (PRILOSEC) 20 MG capsule Take 1 capsule (20 mg total) by mouth daily. 30 capsule 0  . potassium chloride SA (KLOR-CON) 20 MEQ tablet Take 1 tablet (20 mEq total) by mouth daily for 5 days. 5  tablet 0  . predniSONE (DELTASONE) 10 MG tablet TAKE 1 TABLET BY MOUTH EVERY DAY 30 tablet 1   No facility-administered medications prior to visit.     Review of Systems:   Constitutional:   No  weight loss, night sweats,  Fevers, chills,  +fatigue, or  lassitude.  HEENT:   No headaches,   Difficulty swallowing,  Tooth/dental problems, or  Sore throat,                No sneezing, itching, ear ache, + nasal congestion, post nasal drip,   CV:  No chest pain,  Orthopnea, PND, swelling in lower extremities, anasarca, dizziness, palpitations, syncope.   GI  No heartburn, indigestion, abdominal pain, nausea, vomiting, diarrhea, change in bowel habits, loss of appetite, bloody stools.   Resp:  No chest wall deformity  Skin: no rash or lesions.  GU: no dysuria, change in color of urine, no urgency or frequency.  No flank pain, no hematuria   MS:  No joint pain or swelling.  No decreased range of motion.  No back pain.    Physical Exam  BP 140/90 (BP Location: Left Arm, Cuff Size: Large)   Pulse 78   Ht 5\' 5"  (1.651 m)   Wt 255 lb (115.7 kg)   SpO2 98%   BMI 42.43 kg/m   GEN: A/Ox3; pleasant , NAD, BMI 42   HEENT:  [place]/AT, , NOSE-clear, THROAT-clear, no lesions, no postnasal drip or exudate noted.   NECK:  Supple w/ fair ROM; no JVD; normal carotid impulses w/o bruits; no thyromegaly or nodules palpated; no lymphadenopathy.    RESP  Clear  P & A; w/o, wheezes/ rales/ or rhonchi. no accessory muscle use, no dullness to percussion  CARD:  RRR, no m/r/g, no peripheral edema, pulses intact, no cyanosis or clubbing.  GI:   Soft & nt; nml bowel sounds; no organomegaly or masses detected.   Musco: Warm bil, no deformities or joint swelling noted.   Neuro: alert, no focal deficits noted.    Skin: Warm, no lesions or rashes    Lab Results:  CBC  No results found for: BNP   Imaging: No results found.    PFT Results Latest Ref Rng & Units 08/27/2019 05/12/2015  FVC-Pre L 2.32 2.73  FVC-Predicted Pre % 73 84  FVC-Post L 2.26 2.60  FVC-Predicted Post % 71 80  Pre FEV1/FVC % % 84 87  Post FEV1/FCV % % 88 88  FEV1-Pre L 1.95 2.36  FEV1-Predicted Pre % 76 89  FEV1-Post L 2.00 2.30  DLCO UNC% % 89 84  DLCO COR %Predicted % 145 125  TLC L 3.50 3.80  TLC %  Predicted % 65 71  RV % Predicted % 61 62    No results found for: NITRICOXIDE      Assessment & Plan:   Sarcoidosis Possible mild flare with associated acute bronchitis/early sinusitis. Pulmonary function testing and CT scan last fall 2020 showed stable disease.  Plan  Patient Instructions  Zpack take as directed.  Chest xray today  Saline nasal rinses As needed   Mucinex DM Twice daily  As needed  Cough/congestion.  Prednisone taper over next week.  Follow up with Dr. Elsworth Soho  In 3-4 months and As needed   Please contact office for sooner follow up if symptoms do not improve or worsen or seek emergency care        Acute bronchitis Acute bronchitis and possible early sinusitis.  Will treat  with short course of antibiotics and prednisone  Plan  Patient Instructions  Zpack take as directed.  Chest xray today  Saline nasal rinses As needed   Mucinex DM Twice daily  As needed  Cough/congestion.  Prednisone taper over next week.  Follow up with Dr. Elsworth Soho  In 3-4 months and As needed   Please contact office for sooner follow up if symptoms do not improve or worsen or seek emergency care           Rexene Edison, NP 06/10/2020

## 2020-06-10 NOTE — Assessment & Plan Note (Addendum)
Possible mild flare with associated acute bronchitis/early sinusitis. Pulmonary function testing and CT scan last fall 2020 showed stable disease.  Plan  Patient Instructions  Zpack take as directed.  Chest xray today  Saline nasal rinses As needed   Mucinex DM Twice daily  As needed  Cough/congestion.  Prednisone taper over next week.  Follow up with Dr. Elsworth Soho  In 3-4 months and As needed   Please contact office for sooner follow up if symptoms do not improve or worsen or seek emergency care

## 2020-06-10 NOTE — Assessment & Plan Note (Signed)
Acute bronchitis and possible early sinusitis.  Will treat with short course of antibiotics and prednisone  Plan  Patient Instructions  Zpack take as directed.  Chest xray today  Saline nasal rinses As needed   Mucinex DM Twice daily  As needed  Cough/congestion.  Prednisone taper over next week.  Follow up with Dr. Elsworth Soho  In 3-4 months and As needed   Please contact office for sooner follow up if symptoms do not improve or worsen or seek emergency care

## 2020-06-11 ENCOUNTER — Telehealth: Payer: Self-pay | Admitting: Pulmonary Disease

## 2020-06-11 NOTE — Telephone Encounter (Signed)
Stable sarcoid changes . No sign of PNA .  Continue with office visit recommendations and f/up  Please contact office for sooner follow up if symptoms do not improve or worsen or seek emergency care   Spoke with patient. She is aware of her results and verbalized understanding.   Nothing further needed at time of call.

## 2020-07-08 DIAGNOSIS — Z8639 Personal history of other endocrine, nutritional and metabolic disease: Secondary | ICD-10-CM | POA: Diagnosis not present

## 2020-07-08 DIAGNOSIS — E039 Hypothyroidism, unspecified: Secondary | ICD-10-CM | POA: Diagnosis not present

## 2020-07-08 DIAGNOSIS — I10 Essential (primary) hypertension: Secondary | ICD-10-CM | POA: Diagnosis not present

## 2020-07-08 DIAGNOSIS — R7303 Prediabetes: Secondary | ICD-10-CM | POA: Diagnosis not present

## 2020-07-08 DIAGNOSIS — R630 Anorexia: Secondary | ICD-10-CM | POA: Diagnosis not present

## 2020-07-08 DIAGNOSIS — R195 Other fecal abnormalities: Secondary | ICD-10-CM | POA: Diagnosis not present

## 2020-07-08 DIAGNOSIS — R5383 Other fatigue: Secondary | ICD-10-CM | POA: Diagnosis not present

## 2020-07-08 DIAGNOSIS — R5381 Other malaise: Secondary | ICD-10-CM | POA: Diagnosis not present

## 2020-07-13 DIAGNOSIS — E876 Hypokalemia: Secondary | ICD-10-CM | POA: Diagnosis not present

## 2020-07-13 DIAGNOSIS — R197 Diarrhea, unspecified: Secondary | ICD-10-CM | POA: Diagnosis not present

## 2020-07-15 DIAGNOSIS — R197 Diarrhea, unspecified: Secondary | ICD-10-CM | POA: Diagnosis not present

## 2020-07-15 DIAGNOSIS — E876 Hypokalemia: Secondary | ICD-10-CM | POA: Diagnosis not present

## 2020-07-15 DIAGNOSIS — R195 Other fecal abnormalities: Secondary | ICD-10-CM | POA: Diagnosis not present

## 2020-07-15 DIAGNOSIS — Z9049 Acquired absence of other specified parts of digestive tract: Secondary | ICD-10-CM | POA: Diagnosis not present

## 2020-07-20 DIAGNOSIS — E876 Hypokalemia: Secondary | ICD-10-CM | POA: Diagnosis not present

## 2020-09-08 ENCOUNTER — Ambulatory Visit: Payer: BC Managed Care – PPO | Admitting: Family Medicine

## 2020-09-16 ENCOUNTER — Ambulatory Visit: Payer: BC Managed Care – PPO | Admitting: Pulmonary Disease

## 2020-09-30 DIAGNOSIS — I872 Venous insufficiency (chronic) (peripheral): Secondary | ICD-10-CM | POA: Diagnosis not present

## 2020-10-08 ENCOUNTER — Ambulatory Visit: Payer: BC Managed Care – PPO | Admitting: Pulmonary Disease

## 2020-10-08 DIAGNOSIS — Z6841 Body Mass Index (BMI) 40.0 and over, adult: Secondary | ICD-10-CM | POA: Diagnosis not present

## 2020-10-08 DIAGNOSIS — I83893 Varicose veins of bilateral lower extremities with other complications: Secondary | ICD-10-CM | POA: Diagnosis not present

## 2020-10-08 DIAGNOSIS — I872 Venous insufficiency (chronic) (peripheral): Secondary | ICD-10-CM | POA: Diagnosis not present

## 2020-11-09 ENCOUNTER — Ambulatory Visit: Payer: BC Managed Care – PPO | Admitting: Adult Health

## 2020-12-01 DIAGNOSIS — B351 Tinea unguium: Secondary | ICD-10-CM | POA: Diagnosis not present

## 2020-12-16 ENCOUNTER — Telehealth: Payer: Self-pay | Admitting: Pulmonary Disease

## 2020-12-16 NOTE — Telephone Encounter (Signed)
I looked at her PFTs from 2020  Unfortunately she does not meet criteria with diagnosis of sarcoidosis

## 2020-12-16 NOTE — Telephone Encounter (Signed)
Spoke with pt, aware of recs.  Nothing further needed at this time- will close encounter.   

## 2020-12-16 NOTE — Telephone Encounter (Signed)
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

## 2020-12-30 DIAGNOSIS — H16143 Punctate keratitis, bilateral: Secondary | ICD-10-CM | POA: Diagnosis not present

## 2020-12-31 ENCOUNTER — Ambulatory Visit: Payer: BC Managed Care – PPO | Admitting: Adult Health

## 2021-01-06 DIAGNOSIS — H16143 Punctate keratitis, bilateral: Secondary | ICD-10-CM | POA: Diagnosis not present

## 2021-03-05 ENCOUNTER — Other Ambulatory Visit: Payer: Self-pay

## 2021-03-05 ENCOUNTER — Encounter: Payer: Self-pay | Admitting: Adult Health

## 2021-03-05 ENCOUNTER — Ambulatory Visit (INDEPENDENT_AMBULATORY_CARE_PROVIDER_SITE_OTHER): Payer: BC Managed Care – PPO | Admitting: Adult Health

## 2021-03-05 ENCOUNTER — Ambulatory Visit: Payer: BC Managed Care – PPO

## 2021-03-05 VITALS — BP 140/80 | HR 95 | Temp 97.1°F | Ht 65.0 in | Wt 259.4 lb

## 2021-03-05 DIAGNOSIS — H539 Unspecified visual disturbance: Secondary | ICD-10-CM

## 2021-03-05 DIAGNOSIS — D869 Sarcoidosis, unspecified: Secondary | ICD-10-CM | POA: Diagnosis not present

## 2021-03-05 LAB — COMPREHENSIVE METABOLIC PANEL
ALT: 15 U/L (ref 0–35)
AST: 17 U/L (ref 0–37)
Albumin: 3.6 g/dL (ref 3.5–5.2)
Alkaline Phosphatase: 101 U/L (ref 39–117)
BUN: 12 mg/dL (ref 6–23)
CO2: 29 mEq/L (ref 19–32)
Calcium: 9.2 mg/dL (ref 8.4–10.5)
Chloride: 104 mEq/L (ref 96–112)
Creatinine, Ser: 0.8 mg/dL (ref 0.40–1.20)
GFR: 86.2 mL/min (ref >60.00)
Glucose, Bld: 142 mg/dL — ABNORMAL HIGH (ref 70–99)
Potassium: 2.9 mEq/L — ABNORMAL LOW (ref 3.5–5.1)
Sodium: 142 mEq/L (ref 135–145)
Total Bilirubin: 0.3 mg/dL (ref 0.2–1.2)
Total Protein: 7.5 g/dL (ref 6.0–8.3)

## 2021-03-05 NOTE — Assessment & Plan Note (Signed)
Healthy weight loss 

## 2021-03-05 NOTE — Addendum Note (Signed)
Addended by: Vanessa Barbara on: 03/05/2021 12:21 PM   Modules accepted: Orders

## 2021-03-05 NOTE — Patient Instructions (Addendum)
Refer to Ophthalmology for sarcoid.  Activity as tolerated.  Covid booster .  Work on healthy weight.  Labs today .  Follow up with Dr. Elsworth Soho  In 6 months with chest xray and As needed

## 2021-03-05 NOTE — Assessment & Plan Note (Addendum)
Sarcoidosis-appears stable.  Will check chest x-ray on return.  Previous pulmonary function test 2020 showed stable lung function. Patient needs a referral to ophthalmology.  Recommend yearly follow-ups. Labs today with cmet   Plan  Patient Instructions  Refer to Ophthalmology for sarcoid.  Activity as tolerated.  Covid booster .  Work on healthy weight.  Labs today .  Follow up with Dr. Elsworth Soho  In 6 months with chest xray and As needed

## 2021-03-05 NOTE — Progress Notes (Signed)
@Patient  ID: Elizabeth Riggs, female    DOB: 12/16/1971, 50 y.o.   MRN: [id number]  Chief Complaint  Patient presents with  . Follow-up    Referring provider: Sinclair Ship, MD  HPI: 50 year old female never smoker followed for sarcoidosis Initial presentation was March 2016 with weight loss, intractable nausea vomiting.  Underwent a lap cholecystectomy March 12, 2015 with gallbladder lesion noted.  Pathology showed noncaseating granulomas. 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 Chest x-ray August 2019 mild chronic reticular changes notable in the bases Spirometry August 2019 FEV1 73%, ratio 84, FVC 70%-no significant change since 2018  92020 FEV1 at 78%, ratio is 88, FVC 71%, DLCO 89%.  July 31, 2019 high-resolution CT chest -no pathologically enlarged mediastinal or hilar adenopathy.  Minimal patchy subpleural reticulation and fine nodularity along the fissures and subpleural lungs bilaterally this is decreased from previous study in 2016, minimal  distortion and minimal traction bronchiectasis lower lung slightly increased from prior  03/05/2021 Follow up : Sarcoidosis Patient returns for a follow-up visit.  She was last seen June 2021.  Patient says overall she has been doing okay.  She denies any rash, visual changes, joint swelling, hemoptysis, abdominal pain or weight loss.  His breathing is doing well.  No flare of cough or wheezing.  Chest x-ray June 2021 showed stable bilateral interstitial densities. PFTs September 2020 showed stable lung function. Covid vaccine x2 are  up-to-date.  We discussed her getting the booster. Seen optometry recently , placed on steroid eye drops. Wants a referral to Ophthalmology due to ongoing eye issues .  Working fulltime at [employer]. No change in activity level.  Feels fatigued most days.  Denies snoring or daytime sleepiness. PCP recently left, establishing with new provider at the same office.   Adult kids , grandkids . Lives alone. Single .    Allergies  Allergen Reactions  . Morphine And Related Itching  . Lisinopril Other (See Comments) and Rash    headache    Immunization History  Administered Date(s) Administered  . Influenza Split 12/23/2016, 12/26/2017, 07/26/2018  . Influenza,inj,Quad PF,6+ Mos 08/26/2020  . Influenza-Unspecified 12/23/2016, 12/26/2017, 07/26/2018  . PFIZER(Purple Top)SARS-COV-2 Vaccination 01/28/2020, 02/18/2020    Past Medical History:  Diagnosis Date  . Chronic pain of both knees   . Gallstones   . GERD (gastroesophageal reflux disease)   . Hypertension   . Obesity   . Sarcoidosis   . Thyroid disease     Tobacco History: Social History   Tobacco Use  Smoking Status Never Smoker  Smokeless Tobacco Never Used   Counseling given: Not Answered   Outpatient Medications Prior to Visit  Medication Sig Dispense Refill  . ibuprofen (ADVIL,MOTRIN) 400 MG tablet Take 400 mg by mouth every 6 (six) hours as needed for moderate pain.    Marland Kitchen levothyroxine (SYNTHROID, LEVOTHROID) 125 MCG tablet Take 1 tablet (125 mcg total) by mouth daily. 30 tablet 0  . omeprazole (PRILOSEC) 20 MG capsule Take 1 capsule (20 mg total) by mouth daily. 30 capsule 0  . potassium chloride SA (KLOR-CON) 20 MEQ tablet Take 1 tablet (20 mEq  total) by mouth daily for 5 days. 5 tablet 0  . predniSONE (DELTASONE) 10 MG tablet 4 tabs for 2 days, then 3 tabs for 2 days, 2 tabs for 2 days, then 1 tab for 2 days, then stop (Patient not taking: Reported on 03/05/2021) 20 tablet 0   No facility-administered  medications prior to visit.     Review of Systems:   Constitutional:   No  weight loss, night sweats,  Fevers, chills, + fatigue, or  lassitude.  HEENT:   No headaches,  Difficulty swallowing,  Tooth/dental problems, or  Sore throat,                No sneezing, itching, ear ache, nasal congestion, post nasal drip,   CV:  No chest pain,  Orthopnea, PND, swelling in lower extremities, anasarca, dizziness, palpitations, syncope.   GI  No heartburn, indigestion, abdominal pain, nausea, vomiting, diarrhea, change in bowel habits, loss of appetite, bloody stools.   Resp:    No excess mucus, no productive cough,  No non-productive cough,  No coughing up of blood.  No change in color of mucus.  No wheezing.  No chest wall deformity  Skin: no rash or lesions.  GU: no dysuria, change in color of urine, no urgency or frequency.  No flank pain, no hematuria   MS:  No joint pain or swelling.  No decreased range of motion.  No back pain.    Physical Exam  BP 140/80 (BP Location: Left Arm, Patient Position: Sitting, Cuff Size: Large)   Pulse 95   Temp (!) 97.1 F (36.2 C) (Temporal)   Ht 5\' 5"  (1.651 m)   Wt 259 lb 6.4 oz (117.7 kg)   SpO2 98%   BMI 43.17 kg/m   GEN: A/Ox3; pleasant , NAD, well nourished , BMI 43   HEENT:  [place]/AT,    NOSE-clear, THROAT-clear, no lesions, no postnasal drip or exudate noted.   NECK:  Supple w/ fair ROM; no JVD; normal carotid impulses w/o bruits; no thyromegaly or nodules palpated; no lymphadenopathy.    RESP  Clear  P & A; w/o, wheezes/ rales/ or rhonchi. no accessory muscle use, no dullness to percussion  CARD:  RRR, no m/r/g, no peripheral edema, pulses intact, no cyanosis or clubbing.  GI:   Soft & nt; nml bowel sounds; no organomegaly or masses detected.   Musco: Warm bil, no deformities or joint swelling noted.   Neuro: alert, no focal deficits noted.    Skin: Warm, no lesions or rashes    Lab Results:   BNP No results found for:  BNP  ProBNP    Component Value Date/Time   PROBNP 12.0 02/13/2018 1516    Imaging: No results found.    PFT Results Latest Ref Rng & Units 08/27/2019 05/12/2015  FVC-Pre L 2.32 2.73  FVC-Predicted Pre % 73 84  FVC-Post L 2.26 2.60  FVC-Predicted Post % 71 80  Pre FEV1/FVC % % 84 87  Post FEV1/FCV % % 88 88  FEV1-Pre L 1.95 2.36  FEV1-Predicted Pre % 76 89  FEV1-Post L 2.00 2.30  DLCO uncorrected ml/min/mmHg 20.24 22.68  DLCO UNC% % 89 84  DLVA Predicted % 145 125  TLC L 3.50 3.80  TLC % Predicted % 65 71  RV % Predicted % 61 62    No results found for: NITRICOXIDE      Assessment & Plan:   Sarcoidosis Sarcoidosis-appears stable.  Will check chest x-ray on return.  Previous pulmonary function test 2020 showed stable lung function. Patient needs a referral to ophthalmology.  Recommend yearly follow-ups. Labs today with cmet   Plan  Patient Instructions  Refer to Ophthalmology for sarcoid.  Activity as tolerated.  Covid booster .  Work on healthy weight.  Labs today .  Follow up with Dr. Elsworth Soho  In 6 months with chest xray and As needed       Class 3 obesity due to excess calories without serious comorbidity with body mass index (BMI) of 40.0 to 44.9 in adult [facility]) Healthy weight loss  Visual changes Patient complains of ongoing eye issues.  Will refer to ophthalmology.     Rexene Edison, NP 03/05/2021

## 2021-03-05 NOTE — Assessment & Plan Note (Signed)
Patient complains of ongoing eye issues.  Will refer to ophthalmology.

## 2021-03-08 MED ORDER — POTASSIUM CHLORIDE CRYS ER 20 MEQ PO TBCR: EXTENDED_RELEASE_TABLET | ORAL | 0 refills | Status: DC

## 2021-03-08 NOTE — Addendum Note (Signed)
Addended by: Vanessa Barbara on: 03/08/2021 10:43 AM   Modules accepted: Orders

## 2021-03-08 NOTE — Progress Notes (Signed)
Called and spoke with patient, advised of results/recommendations per Rexene Edison NP.  Patient verbalized understanding.  Verified patient pharmacy, script sent to pharmacy.  Nothing further needed.

## 2021-03-16 DIAGNOSIS — Z791 Long term (current) use of non-steroidal anti-inflammatories (NSAID): Secondary | ICD-10-CM | POA: Diagnosis not present

## 2021-03-16 DIAGNOSIS — K3184 Gastroparesis: Secondary | ICD-10-CM | POA: Diagnosis not present

## 2021-03-16 DIAGNOSIS — R197 Diarrhea, unspecified: Secondary | ICD-10-CM | POA: Diagnosis not present

## 2021-03-16 DIAGNOSIS — K219 Gastro-esophageal reflux disease without esophagitis: Secondary | ICD-10-CM | POA: Diagnosis not present

## 2021-03-22 ENCOUNTER — Telehealth: Payer: Self-pay | Admitting: Pulmonary Disease

## 2021-03-22 IMAGING — CT CT CHEST HIGH RESOLUTION WITHOUT CONTRAST
2 of 5 series · 15 of 36 positions shown, 18 images · non-contrast
Comparison: 04/01/2015 chest CT.

CLINICAL DATA: Sarcoidosis, presenting for routine follow-up.

EXAM:
CT CHEST WITHOUT CONTRAST
TECHNIQUE: Multidetector CT imaging of the chest was performed following the
standard protocol without intravenous contrast. High resolution
imaging of the lungs, as well as inspiratory and expiratory imaging,
was performed.

[Series 2: thorax · axial · 0.72mm/px · z∈[-320,-56]mm · 12 of 146 slices shown, 15 images]
[im 7/146  mediastinal]
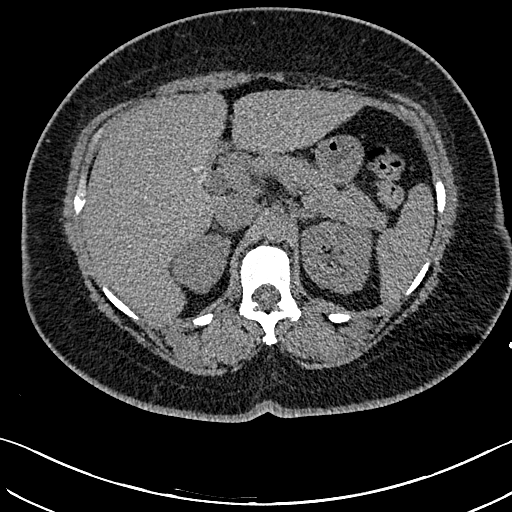
[im 7/146  lung]
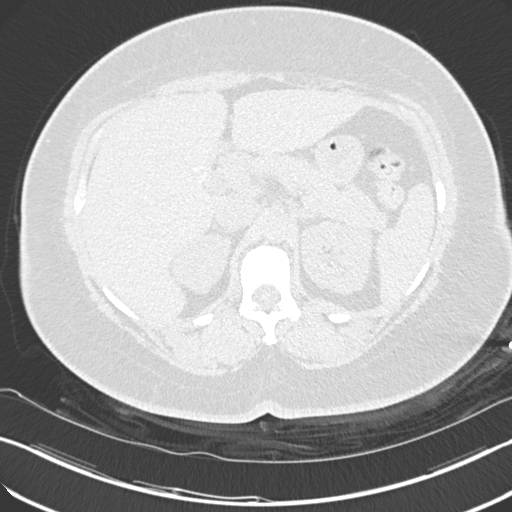
[im 21/146  lung]
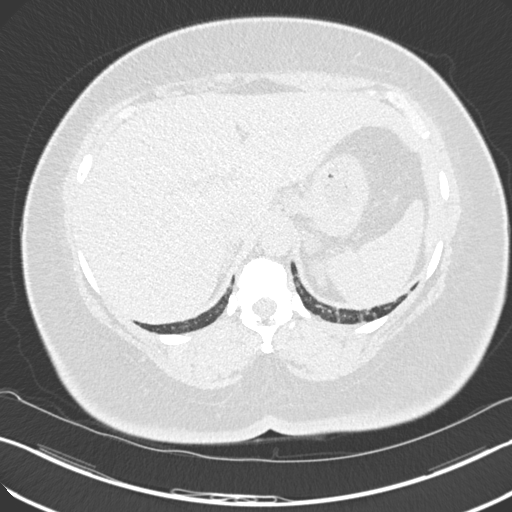
[im 35/146  lung]
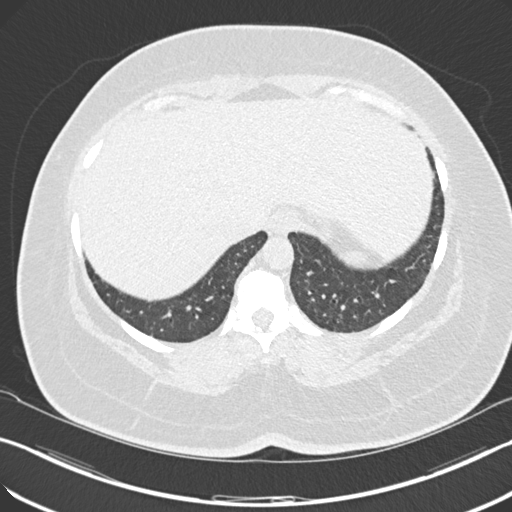
[im 42/146  lung]
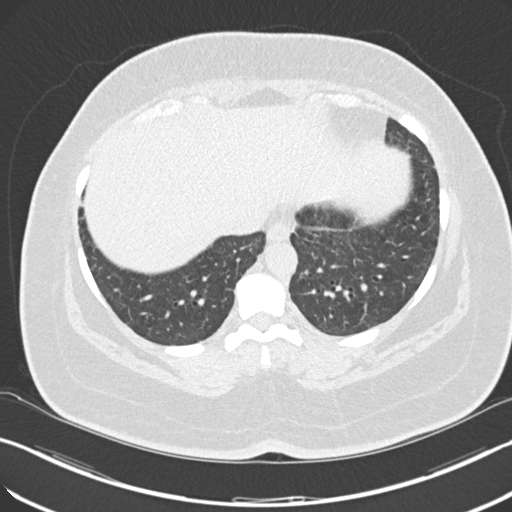
[im 56/146  mediastinal]
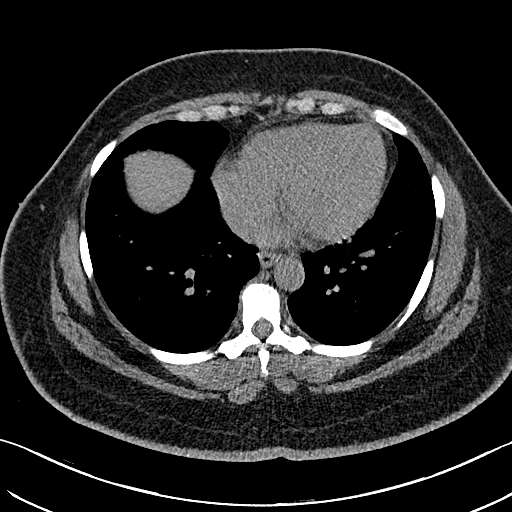
[im 56/146  lung]
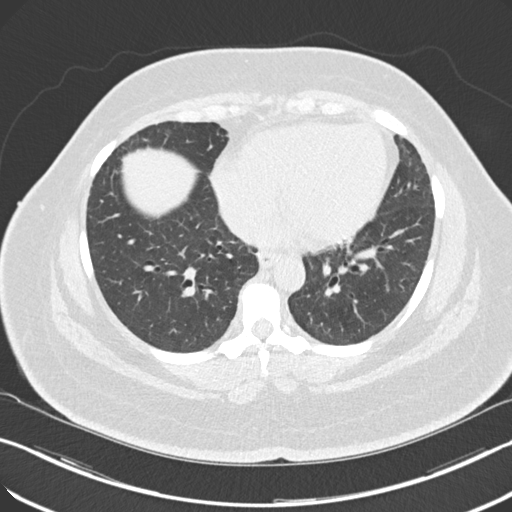
[im 70/146  lung]
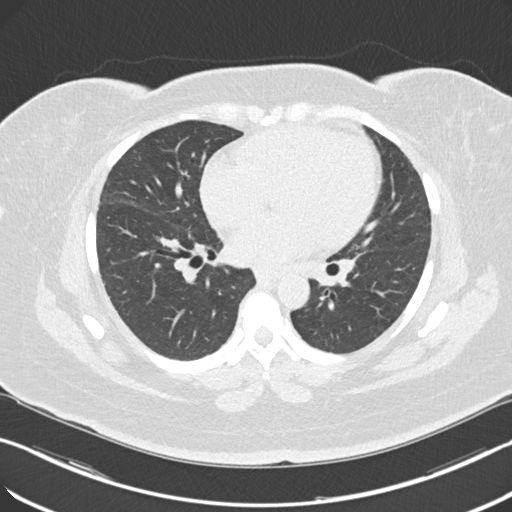
[im 76/146  lung]
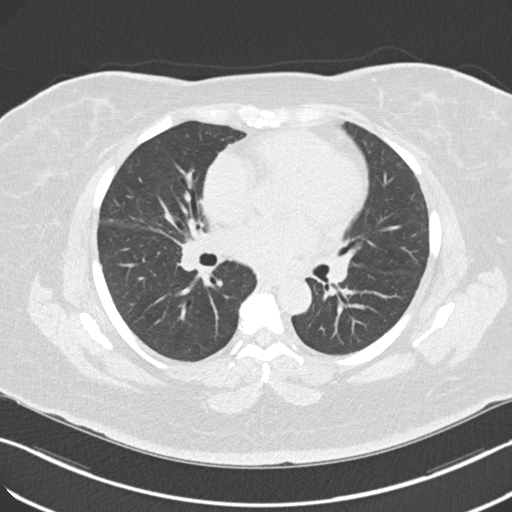
[im 90/146  lung]
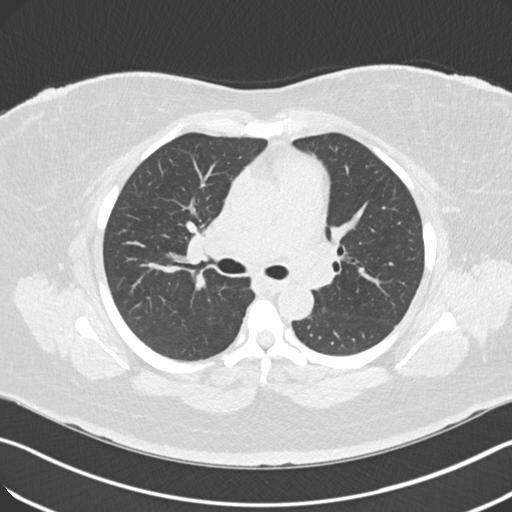
[im 104/146  mediastinal]
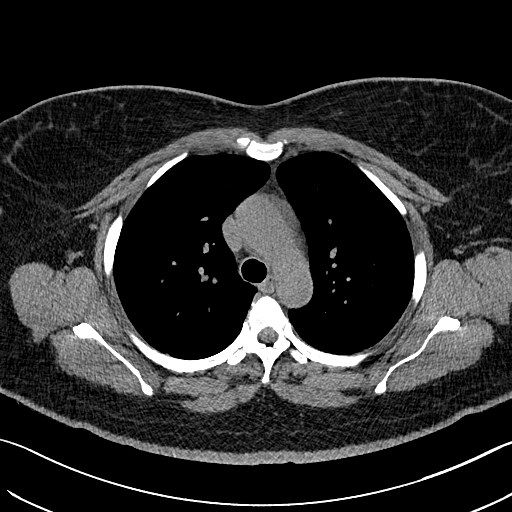
[im 104/146  lung]
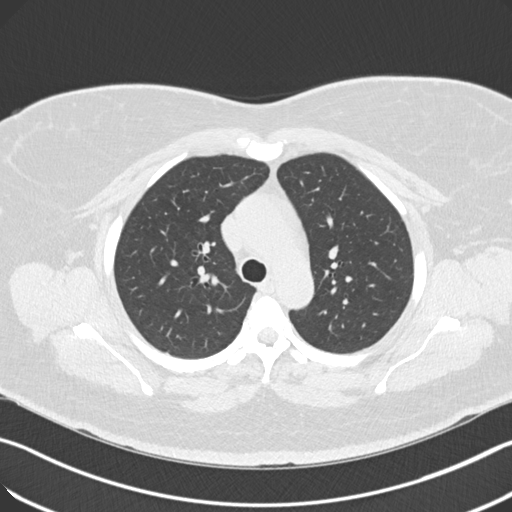
[im 111/146  lung]
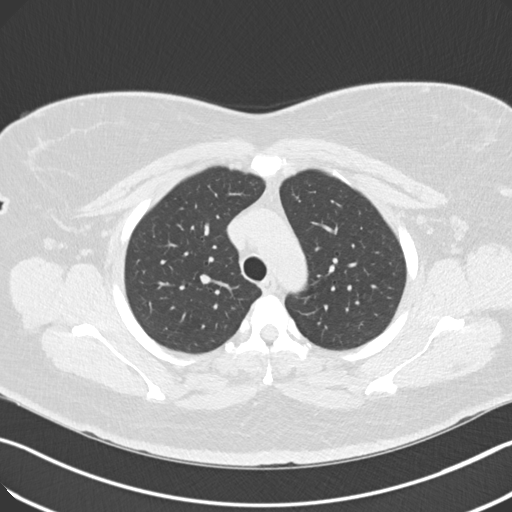
[im 125/146  lung]
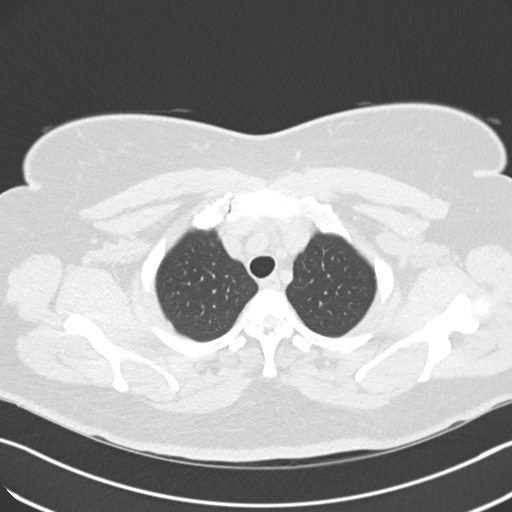
[im 139/146  lung]
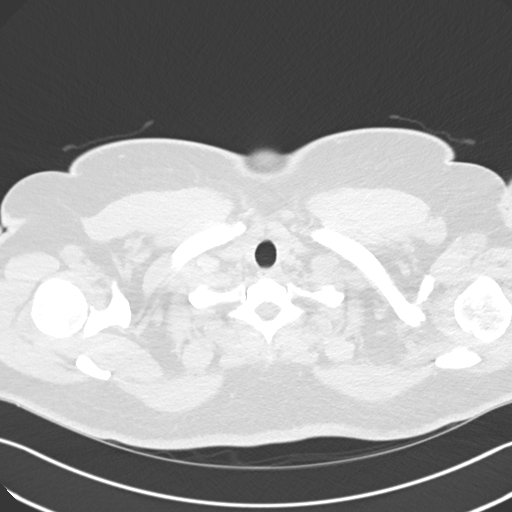

[Series 6: coronal · coronal · 0.60mm/px · 3 of 162 slices shown]
[im 33/162  lung]
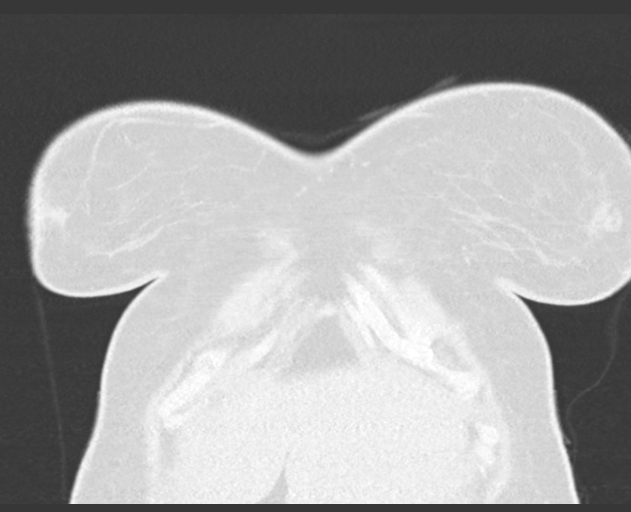
[im 65/162  lung]
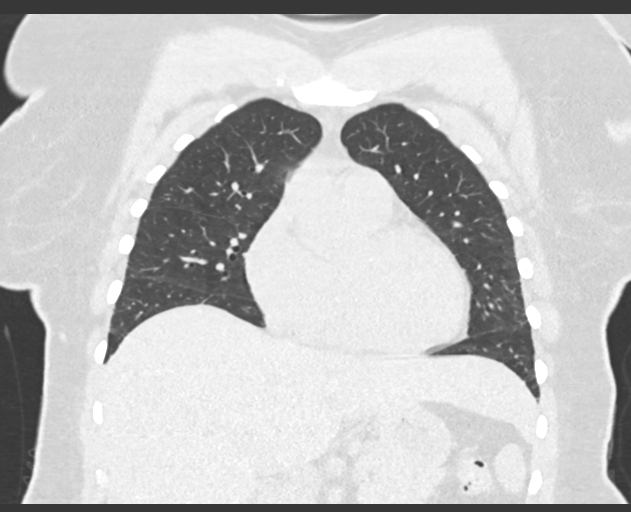
[im 97/162  lung]
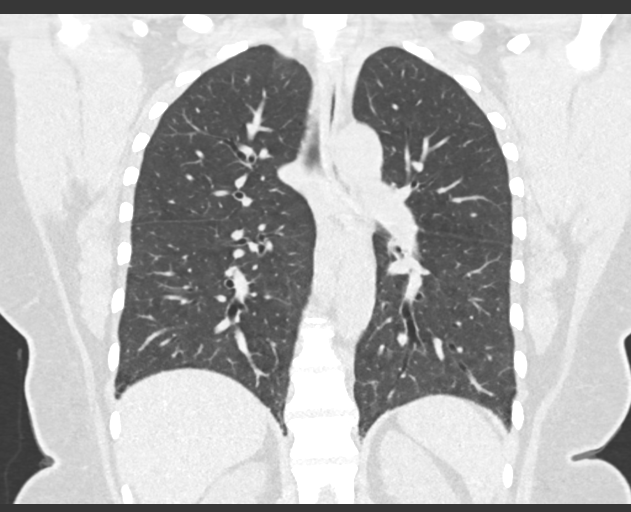

[15 of 36 positions shown; findings below may reference images not displayed]

FINDINGS: Cardiovascular: Normal heart size. No significant pericardial
effusion/thickening. Normal course and caliber of the thoracic
aorta. Top-normal caliber main pulmonary artery (3.3 cm diameter),
increased from 2.9 cm.

Mediastinum/Nodes: No discrete thyroid nodules. Unremarkable
esophagus. No pathologically enlarged axillary, mediastinal or hilar
lymph nodes, noting limited sensitivity for the detection of hilar
adenopathy on this noncontrast study.

Lungs/Pleura: No pneumothorax. No pleural effusion. No significant
air trapping on the expiration sequence. No acute consolidative
airspace disease or lung masses. There is minimal patchy subpleural
reticulation and fine nodularity along the fissures and subpleural
lungs bilaterally, most prominent in the mid to lower lungs. These
findings are decreased compared to the 04/01/2015 chest CT study. No
residual measurable pulmonary nodules. No new significant pulmonary
nodules. There is minimal architectural distortion and minimal
traction bronchiolectasis in lower lungs, which is increased from
prior. No frank honeycombing.

Upper abdomen: No acute abnormality.

Musculoskeletal: No aggressive appearing focal osseous lesions. Mild
thoracic spondylosis.
IMPRESSION: Pulmonary parenchymal findings of sarcoidosis have overall decreased
since 8131 chest CT, although with some interval development of
minimal fibrosis in the lower lungs.

Thoracic adenopathy has resolved.

## 2021-03-31 ENCOUNTER — Telehealth: Payer: Self-pay | Admitting: Adult Health

## 2021-03-31 NOTE — Telephone Encounter (Signed)
PCC's please advise on this referral.  It was placed back on 03/05/2021.  thanks

## 2021-04-01 NOTE — Telephone Encounter (Signed)
Nothing noted in message. Will close encounter.  

## 2021-04-06 NOTE — Telephone Encounter (Signed)
I called Dr. Zigmund Daniel' office.  They do not work their [place] in [place].  They work via fax [place].  Faxed referral to [phone]. Pt aware.  Nothing further needed at this time.

## 2021-04-07 ENCOUNTER — Other Ambulatory Visit: Payer: Self-pay | Admitting: Adult Health

## 2021-04-16 DIAGNOSIS — R197 Diarrhea, unspecified: Secondary | ICD-10-CM | POA: Diagnosis not present

## 2021-04-16 DIAGNOSIS — K3184 Gastroparesis: Secondary | ICD-10-CM | POA: Diagnosis not present

## 2021-04-16 DIAGNOSIS — R1013 Epigastric pain: Secondary | ICD-10-CM | POA: Diagnosis not present

## 2021-06-07 DIAGNOSIS — Z0001 Encounter for general adult medical examination with abnormal findings: Secondary | ICD-10-CM | POA: Diagnosis not present

## 2021-06-07 DIAGNOSIS — R5382 Chronic fatigue, unspecified: Secondary | ICD-10-CM | POA: Diagnosis not present

## 2021-06-07 DIAGNOSIS — M79606 Pain in leg, unspecified: Secondary | ICD-10-CM | POA: Diagnosis not present

## 2021-06-07 DIAGNOSIS — E039 Hypothyroidism, unspecified: Secondary | ICD-10-CM | POA: Diagnosis not present

## 2021-06-07 DIAGNOSIS — Z0000 Encounter for general adult medical examination without abnormal findings: Secondary | ICD-10-CM | POA: Diagnosis not present

## 2021-06-07 DIAGNOSIS — I10 Essential (primary) hypertension: Secondary | ICD-10-CM | POA: Diagnosis not present

## 2021-06-07 DIAGNOSIS — R7303 Prediabetes: Secondary | ICD-10-CM | POA: Diagnosis not present

## 2021-06-07 DIAGNOSIS — Z13220 Encounter for screening for lipoid disorders: Secondary | ICD-10-CM | POA: Diagnosis not present

## 2021-06-07 DIAGNOSIS — I872 Venous insufficiency (chronic) (peripheral): Secondary | ICD-10-CM | POA: Diagnosis not present

## 2021-06-21 DIAGNOSIS — D3131 Benign neoplasm of right choroid: Secondary | ICD-10-CM | POA: Diagnosis not present

## 2021-06-21 DIAGNOSIS — H40013 Open angle with borderline findings, low risk, bilateral: Secondary | ICD-10-CM | POA: Diagnosis not present

## 2021-06-21 DIAGNOSIS — H5213 Myopia, bilateral: Secondary | ICD-10-CM | POA: Diagnosis not present

## 2021-06-21 DIAGNOSIS — H52203 Unspecified astigmatism, bilateral: Secondary | ICD-10-CM | POA: Diagnosis not present

## 2021-06-21 DIAGNOSIS — H524 Presbyopia: Secondary | ICD-10-CM | POA: Diagnosis not present

## 2021-06-23 DIAGNOSIS — Y92410 Unspecified street and highway as the place of occurrence of the external cause: Secondary | ICD-10-CM | POA: Diagnosis not present

## 2021-06-23 DIAGNOSIS — Y999 Unspecified external cause status: Secondary | ICD-10-CM | POA: Diagnosis not present

## 2021-06-23 DIAGNOSIS — R079 Chest pain, unspecified: Secondary | ICD-10-CM | POA: Diagnosis not present

## 2021-06-23 DIAGNOSIS — J9811 Atelectasis: Secondary | ICD-10-CM | POA: Diagnosis not present

## 2021-06-23 DIAGNOSIS — S20219A Contusion of unspecified front wall of thorax, initial encounter: Secondary | ICD-10-CM | POA: Diagnosis not present

## 2021-06-23 DIAGNOSIS — R918 Other nonspecific abnormal finding of lung field: Secondary | ICD-10-CM | POA: Diagnosis not present

## 2021-06-24 DIAGNOSIS — R079 Chest pain, unspecified: Secondary | ICD-10-CM | POA: Diagnosis not present

## 2021-06-24 DIAGNOSIS — J9811 Atelectasis: Secondary | ICD-10-CM | POA: Diagnosis not present

## 2021-06-24 DIAGNOSIS — R918 Other nonspecific abnormal finding of lung field: Secondary | ICD-10-CM | POA: Diagnosis not present

## 2021-07-05 ENCOUNTER — Encounter (INDEPENDENT_AMBULATORY_CARE_PROVIDER_SITE_OTHER): Payer: BC Managed Care – PPO | Admitting: Ophthalmology

## 2021-07-08 ENCOUNTER — Other Ambulatory Visit: Payer: Self-pay

## 2021-07-08 ENCOUNTER — Encounter (INDEPENDENT_AMBULATORY_CARE_PROVIDER_SITE_OTHER): Payer: Self-pay | Admitting: Ophthalmology

## 2021-07-08 ENCOUNTER — Ambulatory Visit (INDEPENDENT_AMBULATORY_CARE_PROVIDER_SITE_OTHER): Payer: BC Managed Care – PPO | Admitting: Ophthalmology

## 2021-07-08 DIAGNOSIS — H524 Presbyopia: Secondary | ICD-10-CM | POA: Insufficient documentation

## 2021-07-08 DIAGNOSIS — H2513 Age-related nuclear cataract, bilateral: Secondary | ICD-10-CM | POA: Insufficient documentation

## 2021-07-08 DIAGNOSIS — D1809 Hemangioma of other sites: Secondary | ICD-10-CM | POA: Diagnosis not present

## 2021-07-08 NOTE — Progress Notes (Signed)
07/08/2021     CHIEF COMPLAINT Patient presents for Retina Evaluation (NP- Mac Lesion OD- Referred [facility]/Pt states, "I have sarcoidosis and I am not sure if this has anything to do with it but my eyes are very blurry."/)   HISTORY OF PRESENT ILLNESS: Elizabeth Riggs is a 50 y.o. female who presents to the clinic today for:   HPI     Retina Evaluation           Laterality: right eye   Onset: 1 month ago   Duration: 1 month   Associated Symptoms: Floaters (I can tell that I have flaoters OU).  Negative for Distortion and Blind Spot   Response to treatment: no improvement   Comments: NP- Mac Lesion OD- Referred [facility] Pt states, "I have sarcoidosis and I am not sure if this has anything to do with it but my eyes are very blurry."        Last edited by Kendra Opitz, COA on 07/08/2021  2:25 PM.      Referring physician: Debbra Riding, MD [place],  [place] [address]  HISTORICAL INFORMATION:   Selected notes from the [facility] [place]: No current outpatient medications on file. (Ophthalmic Drugs)   No current facility-administered medications for this visit. (Ophthalmic Drugs)   Current Outpatient Medications (Other)  Medication Sig   ibuprofen (ADVIL,MOTRIN) 400 MG tablet Take 400 mg by mouth every 6 (six) hours as needed for moderate pain.   levothyroxine (SYNTHROID, LEVOTHROID) 125 MCG tablet Take 1 tablet (125 mcg total) by mouth daily.   omeprazole (PRILOSEC) 20 MG capsule Take 1 capsule (20 mg total) by mouth daily.   potassium chloride SA (KLOR-CON) 20 MEQ tablet Take 1 tablet (20 mEq total) by mouth daily for 5 days.   potassium chloride SA (KLOR-CON) 20 MEQ tablet Take 2 tablets on first day and then daily.   No current facility-administered medications for this visit. (Other)      REVIEW OF SYSTEMS:    ALLERGIES Allergies  Allergen Reactions   Morphine And Related Itching   Lisinopril  Other (See Comments) and Rash    headache    PAST MEDICAL HISTORY Past Medical History:  Diagnosis Date   Chronic pain of both knees    Gallstones    GERD (gastroesophageal reflux disease)    Hypertension    Obesity    Sarcoidosis    Thyroid disease    Past Surgical History:  Procedure Laterality Date   ABDOMINAL HYSTERECTOMY     CHOLECYSTECTOMY  03/12/15   [facility] in [place] Right 01/10/2019   Procedure: Right Achilles tendon debridement, Excision of [place];  Surgeon: [name], MD;  Location: [place];  Service: Orthopedics;  Laterality: Right;  13mn   GASTROC RECESSION EXTREMITY  01/10/2019   Procedure: GASTROC RECESSION EXTREMITY;  Surgeon: HWylene Simmer MD;  [place]  Service: Orthopedics;;   KNEE SURGERY     right knee surgery      FAMILY HISTORY Family History  Problem Relation Age of Onset   Hypertension Mother    Diabetes Father    Hypertension Father    Cancer Maternal Grandmother        breast   Cancer Maternal Grandfather        prostate   Heart attack Neg Hx  SOCIAL HISTORY Social History   Tobacco Use   Smoking status: Never   Smokeless tobacco: Never  Vaping Use   Vaping Use: Never used  Substance Use Topics   Alcohol use: No    Alcohol/week: 0.0 standard drinks   Drug use: No         OPHTHALMIC EXAM:  Base Eye Exam     Visual Acuity (ETDRS)       Right Left   Dist [place] 20/50 20/25   Dist ph [place] 20/30          Tonometry (Tonopen, 2:28 PM)       Right Left   Pressure 14 11         Pupils       Pupils Dark Light Shape React APD   Right PERRL 4 3 Round Slow None   Left PERRL 5 4 Round Brisk None         Visual Fields       Left Right    Full          Extraocular Movement       Right Left    Full Full         Neuro/Psych     Oriented x3: Yes         Dilation     Both  eyes: 1.0% Mydriacyl, 2.5% Phenylephrine @ 2:28 PM           Slit Lamp and Fundus Exam     External Exam       Right Left   External Normal Normal         Slit Lamp Exam       Right Left   Lids/Lashes Normal Normal   Conjunctiva/Sclera White and quiet White and quiet   Cornea Clear Clear   Anterior Chamber Deep and quiet Deep and quiet   Iris Round and reactive Round and reactive   Lens Clear Clear   Anterior Vitreous Normal Normal         Fundus Exam       Right Left   Posterior Vitreous Normal Normal   Disc Normal Normal   C/D Ratio 0.45 0.45   Macula Irregular subfoveal choroidal coloration, with almost an egg yolk like color which is deep to the retina.  This could be a choroidal hemangioma or a venous loop angioma of the choroid.  In any case there is no associated transudation or exudation nor intraretinal involvement. Normal   Vessels Normal Normal   Periphery Normal Normal            IMAGING AND PROCEDURES  Imaging and Procedures for 07/08/21  OCT, Retina - OU - Both Eyes       Right Eye Quality was good. Scan locations included subfoveal. Central Foveal Thickness: 259. Progression has no prior data. Findings include normal foveal contour.   Left Eye Quality was good. Scan locations included subfoveal. Central Foveal Thickness: 275. Progression has no prior data. Findings include normal foveal contour.   Notes Irregular choroidal vessel and lucency to OCT in the depths of the choroid.  This could be a venous angioma loop     Color Fundus Photography Optos - OU - Both Eyes       Right Eye Progression has no prior data. Disc findings include normal observations. Vessels : normal observations. Periphery : normal observations.   Left Eye Progression has no prior data. Disc findings include normal observations. Macula : normal observations. Vessels :  normal observations. Periphery : normal observations.   Notes Yellow choroidal lesion  subfoveal, non exudative, possible small hemangioma of choroid. OD  OBSERVE             ASSESSMENT/PLAN:  Nuclear sclerotic cataract of both eyes Minor, age-appropriate follow-up as scheduled with [facility]  Presbyopia of both eyes I briefly discussed that this finding of needing near vision glasses is age-appropriate and not related to her macular condition on the right eye  Hemangioma of choroid of right eye Subfoveal lesion best seen on clinical examination and documented color fundus photography OD today.  No current need for therapeutic intervention.  No current impact on acuity.  Will observe  Photodocumentation performed      ICD-10-CM   1. Hemangioma of choroid of right eye  D18.09 OCT, Retina - OU - Both Eyes    Color Fundus Photography Optos - OU - Both Eyes    2. Nuclear sclerotic cataract of both eyes  H25.13     3. Presbyopia of both eyes  H52.4       1.  2.  3.  Ophthalmic Meds Ordered this visit:  No orders of the defined types were placed in this encounter.      Return in about 1 year (around 07/08/2022) for DILATE OU, COLOR FP, OCT.  There are no Patient Instructions on file for this visit.   Explained the diagnoses, plan, and follow up with the patient and they expressed understanding.  Patient expressed understanding of the importance of proper follow up care.   Clent Demark Ninfa Giannelli M.D. Diseases & Surgery of the Retina and Vitreous [facility]abetic [place] 07/08/21     Abbreviations: M myopia (nearsighted); A astigmatism; H hyperopia (farsighted); P presbyopia; Mrx spectacle prescription;  CTL contact lenses; OD right eye; OS left eye; OU both eyes  XT exotropia; ET esotropia; PEK punctate epithelial keratitis; PEE punctate epithelial erosions; DES dry eye syndrome; MGD meibomian gland dysfunction; ATs artificial tears; PFAT's preservative free artificial tears; [place] nuclear sclerotic cataract; PSC posterior subcapsular cataract; ERM  epi-retinal membrane; PVD posterior vitreous detachment; RD retinal detachment; DM diabetes mellitus; DR diabetic retinopathy; NPDR non-proliferative diabetic retinopathy; PDR proliferative diabetic retinopathy; CSME clinically significant macular edema; DME diabetic macular edema; dbh dot blot hemorrhages; CWS cotton wool spot; POAG primary open angle glaucoma; C/D cup-to-disc ratio; HVF humphrey visual field; GVF goldmann visual field; OCT optical coherence tomography; IOP intraocular pressure; BRVO Branch retinal vein occlusion; CRVO central retinal vein occlusion; CRAO central retinal artery occlusion; BRAO branch retinal artery occlusion; RT retinal tear; SB scleral buckle; PPV pars plana vitrectomy; VH Vitreous hemorrhage; PRP panretinal laser photocoagulation; IVK intravitreal kenalog; VMT vitreomacular traction; MH Macular hole;  NVD neovascularization of the disc; NVE neovascularization elsewhere; AREDS age related eye disease study; ARMD age related macular degeneration; POAG primary open angle glaucoma; EBMD epithelial/anterior basement membrane dystrophy; ACIOL anterior chamber intraocular lens; IOL intraocular lens; PCIOL posterior chamber intraocular lens; Phaco/IOL phacoemulsification with intraocular lens placement; [place] photorefractive keratectomy; LASIK laser assisted in situ keratomileusis; HTN hypertension; DM diabetes mellitus; COPD chronic obstructive pulmonary disease

## 2021-07-08 NOTE — Assessment & Plan Note (Signed)
Subfoveal lesion best seen on clinical examination and documented color fundus photography OD today.  No current need for therapeutic intervention.  No current impact on acuity.  Will observe  Photodocumentation performed

## 2021-07-08 NOTE — Assessment & Plan Note (Signed)
Minor, age-appropriate follow-up as scheduled with [facility]

## 2021-07-08 NOTE — Assessment & Plan Note (Signed)
I briefly discussed that this finding of needing near vision glasses is age-appropriate and not related to her macular condition on the right eye

## 2021-08-11 DIAGNOSIS — M1711 Unilateral primary osteoarthritis, right knee: Secondary | ICD-10-CM | POA: Diagnosis not present

## 2021-08-11 DIAGNOSIS — S82101A Unspecified fracture of upper end of right tibia, initial encounter for closed fracture: Secondary | ICD-10-CM | POA: Diagnosis not present

## 2021-08-11 DIAGNOSIS — I10 Essential (primary) hypertension: Secondary | ICD-10-CM | POA: Diagnosis not present

## 2021-08-11 DIAGNOSIS — M5459 Other low back pain: Secondary | ICD-10-CM | POA: Diagnosis not present

## 2021-08-11 DIAGNOSIS — E559 Vitamin D deficiency, unspecified: Secondary | ICD-10-CM | POA: Diagnosis not present

## 2021-08-11 DIAGNOSIS — M545 Low back pain, unspecified: Secondary | ICD-10-CM | POA: Diagnosis not present

## 2021-08-11 DIAGNOSIS — Z9889 Other specified postprocedural states: Secondary | ICD-10-CM | POA: Diagnosis not present

## 2021-08-11 DIAGNOSIS — M546 Pain in thoracic spine: Secondary | ICD-10-CM | POA: Diagnosis not present

## 2021-08-11 DIAGNOSIS — E039 Hypothyroidism, unspecified: Secondary | ICD-10-CM | POA: Diagnosis not present

## 2021-10-10 ENCOUNTER — Encounter (HOSPITAL_BASED_OUTPATIENT_CLINIC_OR_DEPARTMENT_OTHER): Payer: Self-pay

## 2021-10-10 ENCOUNTER — Emergency Department (HOSPITAL_BASED_OUTPATIENT_CLINIC_OR_DEPARTMENT_OTHER)
Admission: EM | Admit: 2021-10-10 | Discharge: 2021-10-10 | Disposition: A | Discharge status: Home / Self Care | Payer: BC Managed Care – PPO | Attending: Emergency Medicine | Admitting: Emergency Medicine

## 2021-10-10 ENCOUNTER — Other Ambulatory Visit: Payer: Self-pay

## 2021-10-10 DIAGNOSIS — K0889 Other specified disorders of teeth and supporting structures: Secondary | ICD-10-CM | POA: Diagnosis not present

## 2021-10-10 DIAGNOSIS — Z79899 Other long term (current) drug therapy: Secondary | ICD-10-CM | POA: Insufficient documentation

## 2021-10-10 DIAGNOSIS — E039 Hypothyroidism, unspecified: Secondary | ICD-10-CM | POA: Insufficient documentation

## 2021-10-10 DIAGNOSIS — I10 Essential (primary) hypertension: Secondary | ICD-10-CM | POA: Diagnosis not present

## 2021-10-10 MED ORDER — KETOROLAC TROMETHAMINE 60 MG/2ML IM SOLN
30.0000 mg | Freq: Once | INTRAMUSCULAR | Status: AC
  Administered 2021-10-10: 30 mg via INTRAMUSCULAR
  Filled 2021-10-10: qty 2

## 2021-10-10 MED ORDER — AMOXICILLIN-POT CLAVULANATE 875-125 MG PO TABS: 1.0000 | ORAL_TABLET | Freq: Two times a day (BID) | ORAL | 0 refills | Status: DC

## 2021-10-10 NOTE — ED Provider Notes (Signed)
[facility] EMERGENCY DEPARTMENT Provider Note   CSN: [id number] Arrival date & time: 10/10/21  1553     History Chief Complaint  Patient presents with   Dental Pain    Elizabeth Riggs is a 50 y.o. female presents emergency department for evaluation of right upper dental pain for the past 2 days.  She reports the pain is constant and throbbing and worse with eating and drinking.  Reports the pain is radiating to her right ear.  Patient reported she recently had a cavity filled on 09/30/2021.  She called her dentist when the pain for started, but their office was closed.  She reports she will call them tomorrow for an appointment.  She is taking Tylenol and ibuprofen together and is finding moderate relief.  Denies any fever, chills, inability to eat or drink, trouble breathing, trouble opening her mouth.  Medical history includes hypertension and hypothyroidism.  No known drug allergies.  The patient denies any smoking, EtOH, or illicit drug use.   Dental Pain Associated symptoms: no congestion and no fever       Past Medical History:  Diagnosis Date   Chronic pain of both knees    Gallstones    GERD (gastroesophageal reflux disease)    Hypertension    Obesity    Sarcoidosis    Thyroid disease     Patient Active Problem List   Diagnosis Date Noted   Hemangioma of choroid of right eye 07/08/2021   Nuclear sclerotic cataract of both eyes 07/08/2021   Presbyopia of both eyes 07/08/2021   Visual changes 06/20/2019   Pain of left calf 08/07/2018   Achilles tendinitis of right lower extremity 04/27/2018   Plantar fasciitis of right foot 03/09/2018   Bilateral ankle pain 09/06/2017   Varicose veins of leg with swelling, bilateral 07/27/2017   Venous insufficiency of both lower extremities 07/04/2017   Right shoulder pain 03/03/2017   Macular dystrophy 01/12/2017   Left elbow pain 11/09/2016   Class 3 obesity due to excess calories without serious comorbidity with  body mass index (BMI) of 40.0 to 44.9 in adult 11/02/2016   GERD (gastroesophageal reflux disease) 07/10/2015   Acute bronchitis 07/06/2015   Geographic tongue 04/28/2015   Oral thrush 04/28/2015   Sarcoidosis 04/09/2015   Hyperglycemia 04/08/2015   Hypokalemia 03/31/2015   Constipation 03/19/2015   Abdominal pain, epigastric 02/18/2015   Hypothyroidism 02/18/2015   HTN (hypertension) 02/18/2015   Bilateral knee pain 05/12/2011    Past Surgical History:  Procedure Laterality Date   ABDOMINAL HYSTERECTOMY     CHOLECYSTECTOMY  03/12/15   [facility] in [place] Right 01/10/2019   Procedure: Right Achilles tendon debridement, Excision of Haglund;  Surgeon: [name], MD;  Location: [place];  Service: Orthopedics;  Laterality: Right;  33min   GASTROC RECESSION EXTREMITY  01/10/2019   Procedure: GASTROC RECESSION EXTREMITY;  Surgeon: [name], MD;  Location: [place];  Service: Orthopedics;;   KNEE SURGERY     right knee surgery       OB History   No obstetric history on file.     Family History  Problem Relation Age of Onset   Hypertension Mother    Diabetes Father    Hypertension Father    Cancer Maternal Grandmother        breast   Cancer Maternal Grandfather        prostate  Heart attack Neg Hx     Social History   Tobacco Use   Smoking status: Never   Smokeless tobacco: Never  Vaping Use   Vaping Use: Never used  Substance Use Topics   Alcohol use: No    Alcohol/week: 0.0 standard drinks   Drug use: No    Home Medications Prior to Admission medications   Medication Sig Start Date End Date Taking? Authorizing Provider  amoxicillin-clavulanate (AUGMENTIN) 875-125 MG tablet Take 1 tablet by mouth every 12 (twelve) hours. 10/10/21  Yes Sherrell Puller, PA-C  ibuprofen (ADVIL,MOTRIN) 400 MG tablet Take 400 mg by mouth every 6 (six) hours as needed  for moderate pain.    [provider]  levothyroxine (SYNTHROID, LEVOTHROID) 125 MCG tablet Take 1 tablet (125 mcg total) by mouth daily. 03/23/17   Parrett, Fonnie Mu, NP  omeprazole (PRILOSEC) 20 MG capsule Take 1 capsule (20 mg total) by mouth daily. 03/09/17   Malvin Johns, MD  potassium chloride SA (KLOR-CON) 20 MEQ tablet Take 1 tablet (20 mEq total) by mouth daily for 5 days. 05/17/20 05/22/20  Curatolo, Adam, DO  potassium chloride SA (KLOR-CON) 20 MEQ tablet Take 2 tablets on first day and then daily. 03/08/21   Parrett, Fonnie Mu, NP    Allergies    Morphine and related and Lisinopril  Review of Systems   Review of Systems  Constitutional:  Negative for chills and fever.  HENT:  Positive for dental problem and ear pain. Negative for congestion, rhinorrhea and sore throat.   Eyes:  Negative for pain and visual disturbance.  Respiratory:  Negative for cough and shortness of breath.   Cardiovascular:  Negative for chest pain and palpitations.  Gastrointestinal:  Negative for abdominal pain and vomiting.  Genitourinary:  Negative for dysuria and hematuria.  Musculoskeletal:  Negative for arthralgias and back pain.  Skin:  Negative for color change and rash.  Neurological:  Negative for seizures and syncope.  All other systems reviewed and are negative.  Physical Exam Updated Vital Signs BP 127/90 (BP Location: Right Arm)   Pulse 91   Temp 98.3 F (36.8 C) (Oral)   Resp 16   Ht 5\' 5"  (1.651 m)   Wt 112.9 kg   SpO2 99%   BMI 41.44 kg/m   Physical Exam Vitals and nursing note reviewed.  Constitutional:      General: She is not in acute distress.    Appearance: Normal appearance. She is not toxic-appearing.  HENT:     Right Ear: Tympanic membrane, ear canal and external ear normal.     Left Ear: Tympanic membrane, ear canal and external ear normal.     Mouth/Throat:     Mouth: Mucous membranes are moist.     Pharynx: No oropharyngeal exudate or posterior  oropharyngeal erythema.     Comments: Multiple missing teeth.  Patient is tender to palpation on her right upper first molar.  No erythema or surrounding edema.  No induration or fluctuation palpated.  Patient is able to fully open and close mouth.  Uvula midline.  Airway patent.  Moist mucous membranes. Eyes:     General: No scleral icterus. Pulmonary:     Effort: Pulmonary effort is normal. No respiratory distress.  Skin:    General: Skin is dry.     Findings: No rash.  Neurological:     General: No focal deficit present.     Mental Status: She is alert. Mental status is at baseline.  Psychiatric:  Mood and Affect: Mood normal.    ED Results / Procedures / Treatments   Labs (all labs ordered are listed, but only abnormal results are displayed) Labs Reviewed - No data to display  EKG None  Radiology No results found.  Procedures Procedures   Medications Ordered in ED Medications  ketorolac (TORADOL) injection 30 mg (has no administration in time range)    ED Course  I have reviewed the triage vital signs and the nursing notes.  Pertinent labs & imaging results that were available during my care of the patient were reviewed by me and considered in my medical decision making (see chart for details).  The patient recently had a cavity filled on 09/30/2021 where she is having the pain.  She was unable to get in contact with her dentist this weekend.  Exam is reassuring that this is not an abscess.  No induration or fluctuance noted.  No erythema or edema noted to the area.  Moist use membranes.  Airway patent.  Will prescribe Augmentin twice daily for 7 days to cover for infection.  Given Toradol for pain relief currently.  Advised her to follow-up with her dentist on Monday.  Recommended she can take Tylenol and/or ibuprofen for control of pain.  Return precautions given.  Patient agrees with plan.  Patient is stable being discharged home in good condition.   MDM  Rules/Calculators/A&P                          Final Clinical Impression(s) / ED Diagnoses Final diagnoses:  Pain, dental    Rx / DC Orders ED Discharge Orders          Ordered    amoxicillin-clavulanate (AUGMENTIN) 875-125 MG tablet  Every 12 hours        10/10/21 1711             Sherrell Puller, PA-C 10/10/21 1712    Godfrey Pick, MD 10/12/21 1540

## 2021-10-10 NOTE — ED Triage Notes (Addendum)
Right upper tooth pain s/p filling put in last week, patient having pain from upper jaw to ear and side of face.  No facial swelling noted.

## 2021-10-10 NOTE — Discharge Instructions (Addendum)
For evaluation of dental pain.  Please follow-up with your dentist on Monday.  You have been prescribed an antibiotic called Augmentin that you must take twice daily for the next 7 days.  Do not miss a dose, it is important to be adherent as it lowers the chance of reinfection.  You can take Tylenol or ibuprofen as needed for pain.  If you have any new or worsening symptoms, please return to the nearest emergency department.

## 2021-10-19 DIAGNOSIS — I872 Venous insufficiency (chronic) (peripheral): Secondary | ICD-10-CM | POA: Diagnosis not present

## 2021-10-19 DIAGNOSIS — Z6841 Body Mass Index (BMI) 40.0 and over, adult: Secondary | ICD-10-CM | POA: Diagnosis not present

## 2021-10-22 ENCOUNTER — Ambulatory Visit: Payer: BC Managed Care – PPO | Admitting: Family Medicine

## 2021-10-22 ENCOUNTER — Ambulatory Visit: Payer: Self-pay

## 2021-10-22 ENCOUNTER — Other Ambulatory Visit: Payer: Self-pay

## 2021-10-22 ENCOUNTER — Encounter: Payer: Self-pay | Admitting: Family Medicine

## 2021-10-22 ENCOUNTER — Ambulatory Visit (HOSPITAL_BASED_OUTPATIENT_CLINIC_OR_DEPARTMENT_OTHER)
Admission: RE | Admit: 2021-10-22 | Discharge: 2021-10-22 | Disposition: A | Discharge status: Home / Self Care | Payer: BC Managed Care – PPO | Source: Ambulatory Visit | Attending: Family Medicine | Admitting: Family Medicine

## 2021-10-22 VITALS — BP 120/82 | Ht 65.0 in | Wt 249.0 lb

## 2021-10-22 DIAGNOSIS — D869 Sarcoidosis, unspecified: Secondary | ICD-10-CM | POA: Diagnosis not present

## 2021-10-22 DIAGNOSIS — M170 Bilateral primary osteoarthritis of knee: Secondary | ICD-10-CM

## 2021-10-22 DIAGNOSIS — M1712 Unilateral primary osteoarthritis, left knee: Secondary | ICD-10-CM | POA: Diagnosis not present

## 2021-10-22 DIAGNOSIS — G8929 Other chronic pain: Secondary | ICD-10-CM | POA: Diagnosis not present

## 2021-10-22 DIAGNOSIS — M1711 Unilateral primary osteoarthritis, right knee: Secondary | ICD-10-CM | POA: Diagnosis not present

## 2021-10-22 MED ORDER — TRIAMCINOLONE ACETONIDE 40 MG/ML IJ SUSP
40.0000 mg | Freq: Once | INTRAMUSCULAR | Status: AC
  Administered 2021-10-22: 40 mg via INTRA_ARTICULAR

## 2021-10-22 MED ORDER — DICLOFENAC SODIUM 2 % EX SOLN: 1.0000 "application " | Freq: Two times a day (BID) | CUTANEOUS | 2 refills | Status: AC

## 2021-10-22 MED ORDER — RAYOS 5 MG PO TBEC: 5.0000 mg | DELAYED_RELEASE_TABLET | Freq: Every day | ORAL | 1 refills | Status: DC

## 2021-10-22 NOTE — Patient Instructions (Signed)
Nice to meet you Please use the pennsaid as needed  If your joint pain worsens, you can use Rayos at 10 pm for a course of 5 days.  I will call with the results from today  Please use ice as needed Please try the exercises   Please send me a message in MyChart with any questions or updates.  Please see me back in 4 weeks.   --Dr. Raeford Razor

## 2021-10-22 NOTE — Assessment & Plan Note (Signed)
Provided Rayos if she needs as needed

## 2021-10-22 NOTE — Progress Notes (Signed)
JAQUIA BENEDICTO - 50 y.o. female MRN [id number]  Date of birth: 1971-04-30  SUBJECTIVE:  Including CC & ROS.  No chief complaint on file.   DAYANARA SHERRILL is a 50 y.o. female that is presenting with acute on chronic bilateral knee pain.  The pain is medial in nature.  No injury or inciting event.   Review of Systems See HPI   HISTORY: Past Medical, Surgical, Social, and Family History Reviewed & Updated per EMR.   Pertinent Historical Findings include:  Past Medical History:  Diagnosis Date   Chronic pain of both knees    Gallstones    GERD (gastroesophageal reflux disease)    Hypertension    Obesity    Sarcoidosis    Thyroid disease     Past Surgical History:  Procedure Laterality Date   ABDOMINAL HYSTERECTOMY     CHOLECYSTECTOMY  03/12/15   [facility] in [place] TENDON REPAIR Right 01/10/2019   Procedure: Right Achilles tendon debridement, Excision of Haglund;  Surgeon: [name], MD;  Location: [facility];  Service: Orthopedics;  Laterality: Right;  79min   GASTROC RECESSION EXTREMITY  01/10/2019   Procedure: GASTROC RECESSION EXTREMITY;  Surgeon: [name], MD;  Location: [place];  Service: Orthopedics;;   KNEE SURGERY     right knee surgery      Family History  Problem Relation Age of Onset   Hypertension Mother    Diabetes Father    Hypertension Father    Cancer Maternal Grandmother        breast   Cancer Maternal Grandfather        prostate   Heart attack Neg Hx     Social History   Socioeconomic History   Marital status: Single    Spouse name: Not on file   Number of children: Not on file   Years of education: Not on file   Highest education level: Not on file  Occupational History   Not on file  Tobacco Use   Smoking status: Never   Smokeless tobacco: Never  Vaping Use   Vaping Use: Never used  Substance and Sexual Activity   Alcohol use: No     Alcohol/week: 0.0 standard drinks   Drug use: No   Sexual activity: Not on file  Other Topics Concern   Not on file  Social History Narrative   She works as a Quarry manager at [employer]   3 children- all live locally, she has 3 grand children.   2- son   82- son   63- son   Completed 12th grade   Enjoys church, spending time with grand children.   Social Determinants of Health   Financial Resource Strain: Not on file  Food Insecurity: Not on file  Transportation Needs: Not on file  Physical Activity: Not on file  Stress: Not on file  Social Connections: Not on file  Intimate Partner Violence: Not on file     PHYSICAL EXAM:  VS: BP 120/82 (BP Location: Left Arm, Patient Position: Sitting)   Ht 5\' 5"  (1.651 m)   Wt 249 lb (112.9 kg)   BMI 41.44 kg/m  Physical Exam Gen: NAD, alert, cooperative with exam, well-appearing    Aspiration/Injection Procedure Note KARMEN ALTAMIRANO December 18, 1971  Procedure: Injection Indications: Left knee pain  Procedure Details Consent: Risks of procedure as well as the alternatives and risks of each were explained  to the (patient/caregiver).  Consent for procedure obtained. Time Out: Verified patient identification, verified procedure, site/side was marked, verified correct patient position, special equipment/implants available, medications/allergies/relevent history reviewed, required imaging and test results available.  Performed.  The area was cleaned with iodine and alcohol swabs.    The left knee superior lateral suprapatellar pouch was injected using 3 cc 1% lidocaine on a 25-gauge 1-1/2 inch needle.  The syringe was switched and a mixture containing 1 cc's of 40 mg Kenalog and 4 cc's of 0.25% bupivacaine was injected.  Ultrasound was used. Images were obtained in long views showing the injection.     A sterile dressing was applied.  Patient did tolerate procedure well.      ASSESSMENT & PLAN:   OA  (osteoarthritis) of knee Acute on chronic in nature.  Has severe degenerative changes bilaterally. -Counseled on home exercise therapy and supportive care. -X-rays. -Pennsaid. -Left knee injection. -May need to consider referral for arthroplasty or Zilretta  Sarcoidosis Provided Rayos if she needs as needed

## 2021-10-22 NOTE — Assessment & Plan Note (Signed)
Acute on chronic in nature.  Has severe degenerative changes bilaterally. -Counseled on home exercise therapy and supportive care. -X-rays. -Pennsaid. -Left knee injection. -May need to consider referral for arthroplasty or Zilretta

## 2021-10-25 ENCOUNTER — Telehealth: Payer: Self-pay | Admitting: Family Medicine

## 2021-10-25 DIAGNOSIS — M1711 Unilateral primary osteoarthritis, right knee: Secondary | ICD-10-CM

## 2021-10-25 DIAGNOSIS — K219 Gastro-esophageal reflux disease without esophagitis: Secondary | ICD-10-CM | POA: Diagnosis not present

## 2021-10-25 DIAGNOSIS — R1310 Dysphagia, unspecified: Secondary | ICD-10-CM | POA: Diagnosis not present

## 2021-10-25 DIAGNOSIS — D869 Sarcoidosis, unspecified: Secondary | ICD-10-CM

## 2021-10-25 NOTE — Telephone Encounter (Signed)
Informed of results.  She would like to proceed with replacement of the right knee.  Has gotten improvement in left knee with injection.  Like to be evaluated by rheumatology for her sarcoidosis  Rosemarie Ax, MD [facility] 10/25/2021, 1:28 PM

## 2021-11-02 DIAGNOSIS — R252 Cramp and spasm: Secondary | ICD-10-CM | POA: Diagnosis not present

## 2021-11-22 ENCOUNTER — Ambulatory Visit: Payer: BC Managed Care – PPO | Admitting: Family Medicine

## 2021-11-30 DIAGNOSIS — Z6841 Body Mass Index (BMI) 40.0 and over, adult: Secondary | ICD-10-CM | POA: Diagnosis not present

## 2021-11-30 DIAGNOSIS — R1319 Other dysphagia: Secondary | ICD-10-CM | POA: Diagnosis not present

## 2021-12-03 NOTE — Progress Notes (Deleted)
Office Visit Note  Patient: Elizabeth Riggs             Date of Birth: [birth year]           MRN: [id number]             PCP: Elza Rafter, FNP Referring: Rosemarie Ax, MD Visit Date: 12/14/2021 Occupation: @GUAROCC @  Subjective:  No chief complaint on file.   History of Present Illness: Elizabeth Riggs is a 50 y.o. female ***   Activities of Daily Living:  Patient reports morning stiffness for *** {minute/hour:19697}.   Patient {ACTIONS;DENIES/REPORTS:21021675::"Denies"} nocturnal pain.  Difficulty dressing/grooming: {ACTIONS;DENIES/REPORTS:21021675::"Denies"} Difficulty climbing stairs: {ACTIONS;DENIES/REPORTS:21021675::"Denies"} Difficulty getting out of chair: {ACTIONS;DENIES/REPORTS:21021675::"Denies"} Difficulty using hands for taps, buttons, cutlery, and/or writing: {ACTIONS;DENIES/REPORTS:21021675::"Denies"}  No Rheumatology ROS completed.   PMFS History:  Patient Active Problem List   Diagnosis Date Noted   Hemangioma of choroid of right eye 07/08/2021   Nuclear sclerotic cataract of both eyes 07/08/2021   Presbyopia of both eyes 07/08/2021   Visual changes 06/20/2019   Pain of left calf 08/07/2018   Achilles tendinitis of right lower extremity 04/27/2018   Plantar fasciitis of right foot 03/09/2018   Bilateral ankle pain 09/06/2017   Varicose veins of leg with swelling, bilateral 07/27/2017   Venous insufficiency of both lower extremities 07/04/2017   Right shoulder pain 03/03/2017   Macular dystrophy 01/12/2017   Left elbow pain 11/09/2016   Class 3 obesity due to excess calories without serious comorbidity with body mass index (BMI) of 40.0 to 44.9 in adult 11/02/2016   GERD (gastroesophageal reflux disease) 07/10/2015   Acute bronchitis 07/06/2015   Geographic tongue 04/28/2015   Oral thrush 04/28/2015   Sarcoidosis 04/09/2015   Hyperglycemia 04/08/2015   Hypokalemia 03/31/2015   Constipation 03/19/2015   Abdominal pain, epigastric  02/18/2015   Hypothyroidism 02/18/2015   HTN (hypertension) 02/18/2015   OA (osteoarthritis) of knee 05/12/2011    Past Medical History:  Diagnosis Date   Chronic pain of both knees    Gallstones    GERD (gastroesophageal reflux disease)    Hypertension    Obesity    Sarcoidosis    Thyroid disease     Family History  Problem Relation Age of Onset   Hypertension Mother    Diabetes Father    Hypertension Father    Cancer Maternal Grandmother        breast   Cancer Maternal Grandfather        prostate   Heart attack Neg Hx    Past Surgical History:  Procedure Laterality Date   ABDOMINAL HYSTERECTOMY     CHOLECYSTECTOMY  03/12/15   [facility] in [place] Right 01/10/2019   Procedure: Right Achilles tendon debridement, Excision of Haglund;  Surgeon: [name], MD;  Location: [place];  Service: Orthopedics;  Laterality: Right;  55min   GASTROC RECESSION EXTREMITY  01/10/2019   Procedure: GASTROC RECESSION EXTREMITY;  Surgeon: [name], MD;  Location: [place];  Service: Orthopedics;;   KNEE SURGERY     right knee surgery     Social History   Social History Narrative   She works as a Quarry manager at [employer]   Single   3 children- all live locally, she has 3 grand children.   64- son   71- son   72- son   Completed 12th grade   Enjoys church, spending time  with grand children.   Immunization History  Administered Date(s) Administered   Influenza Split 12/23/2016, 12/26/2017, 07/26/2018   Influenza,inj,Quad PF,6+ Mos 08/26/2020   Influenza-Unspecified 12/23/2016, 12/26/2017, 07/26/2018   PFIZER(Purple Top)SARS-COV-2 Vaccination 01/28/2020, 02/18/2020     Objective: Vital Signs: There were no vitals taken for this visit.   Physical Exam   Musculoskeletal Exam: ***  CDAI Exam: CDAI Score: -- Patient Global: --; Provider Global: -- Swollen:  --; Tender: -- Joint Exam 12/14/2021   No joint exam has been documented for this visit   There is currently no information documented on the homunculus. Go to the Rheumatology activity and complete the homunculus joint exam.  Investigation: No additional findings.  Imaging: No results found.  Recent Labs: Lab Results  Component Value Date   WBC 6.0 05/17/2020   HGB 12.8 05/17/2020   PLT 254 05/17/2020   NA 142 03/05/2021   K 2.9 (L) 03/05/2021   CL 104 03/05/2021   CO2 29 03/05/2021   GLUCOSE 142 (H) 03/05/2021   BUN 12 03/05/2021   CREATININE 0.80 03/05/2021   BILITOT 0.3 03/05/2021   ALKPHOS 101 03/05/2021   AST 17 03/05/2021   ALT 15 03/05/2021   PROT 7.5 03/05/2021   ALBUMIN 3.6 03/05/2021   CALCIUM 9.2 03/05/2021   GFRAA >60 05/17/2020    Speciality Comments: No specialty comments available.  Procedures:  No procedures performed Allergies: Morphine and related and Lisinopril   Assessment / Plan:     Visit Diagnoses: Sarcoidosis - 07/31/19:CT: sarcoidosis. CXR 06/10/20: Stable bilateral interstitial densities compared to prior exam most consistent 1/ chronic ILD or possibe sarcoid.  Primary osteoarthritis of both knees  Plantar fasciitis of right foot  Achilles tendinitis of right lower extremity  Venous insufficiency of both lower extremities  Primary hypertension  Hemangioma of choroid of right eye  Geographic tongue  History of gastroesophageal reflux (GERD)  History of hypothyroidism  Presbyopia of both eyes  Nuclear sclerotic cataract of both eyes  Macular dystrophy  Orders: No orders of the defined types were placed in this encounter.  No orders of the defined types were placed in this encounter.   Face-to-face time spent with patient was *** minutes. Greater than 50% of time was spent in counseling and coordination of care.  Follow-Up Instructions: No follow-ups on file.   Ofilia Neas, PA-C  Note - This record has been  created using Dragon software.  Chart creation errors have been sought, but may not always  have been located. Such creation errors do not reflect on  the standard of medical care.

## 2021-12-07 DIAGNOSIS — M1711 Unilateral primary osteoarthritis, right knee: Secondary | ICD-10-CM | POA: Diagnosis not present

## 2021-12-07 DIAGNOSIS — Z6841 Body Mass Index (BMI) 40.0 and over, adult: Secondary | ICD-10-CM | POA: Diagnosis not present

## 2021-12-07 DIAGNOSIS — M1712 Unilateral primary osteoarthritis, left knee: Secondary | ICD-10-CM | POA: Diagnosis not present

## 2021-12-09 ENCOUNTER — Telehealth: Payer: Self-pay | Admitting: Adult Health

## 2021-12-09 ENCOUNTER — Other Ambulatory Visit: Payer: Self-pay

## 2021-12-09 ENCOUNTER — Telehealth (INDEPENDENT_AMBULATORY_CARE_PROVIDER_SITE_OTHER): Payer: BC Managed Care – PPO | Admitting: Adult Health

## 2021-12-09 ENCOUNTER — Encounter: Payer: Self-pay | Admitting: Adult Health

## 2021-12-09 DIAGNOSIS — D869 Sarcoidosis, unspecified: Secondary | ICD-10-CM

## 2021-12-09 DIAGNOSIS — U071 COVID-19: Secondary | ICD-10-CM

## 2021-12-09 MED ORDER — PREDNISONE 20 MG PO TABS: 20.0000 mg | ORAL_TABLET | Freq: Every day | ORAL | 0 refills | Status: DC

## 2021-12-09 MED ORDER — NIRMATRELVIR/RITONAVIR (PAXLOVID)TABLET: ORAL_TABLET | ORAL | 0 refills | Status: DC

## 2021-12-09 MED ORDER — NIRMATRELVIR/RITONAVIR (PAXLOVID)TABLET: 3.0000 | ORAL_TABLET | Freq: Two times a day (BID) | ORAL | 0 refills | Status: DC

## 2021-12-09 NOTE — Telephone Encounter (Signed)
Called and spoke with patient. She is ok with a video visit today at 4pm. Appt has been scheduled and she is aware of how to do a video visit.   Nothing further needed at time of call.

## 2021-12-09 NOTE — Telephone Encounter (Signed)
Primary Pulmonologist: RA Last office visit and with whom: 03/05/21 with TP What do we see them for (pulmonary problems): sarcoidosis Last OV assessment/plan:   Sarcoidosis Sarcoidosis-appears stable.  Will check chest x-ray on return.  Previous pulmonary function test 2020 showed stable lung function. Patient needs a referral to ophthalmology.  Recommend yearly follow-ups. Labs today with cmet    Plan  Patient Instructions  Refer to Ophthalmology for sarcoid.  Activity as tolerated.  Covid booster .  Work on healthy weight.  Labs today .  Follow up with Dr. Elsworth Soho  In 6 months with chest xray and As needed         Class 3 obesity due to excess calories without serious comorbidity with body mass index (BMI) of 40.0 to 44.9 in adult [facility]) Healthy weight loss   Visual changes Patient complains of ongoing eye issues.  Will refer to ophthalmology.    Was appointment offered to patient (explain)?  Patient wanted recommendations.   Reason for call: Called and spoke with patient. 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 She was then exposed again to 2 more patients on Tuesday. She did not develop any symptoms until yesterday when she started wheezing and having increase chest tightness. She has a productive cough with green phlegm.   She has not been prescribed any antivirals. She is interested in one.   (examples of things to ask: : When did symptoms start? Fever? Cough? Productive? Color to sputum? More sputum than usual? Wheezing? Have you needed increased oxygen? Are you taking your respiratory medications? What over the counter measures have you tried?)  Allergies  Allergen Reactions   Morphine And Related Itching   Lisinopril Other (See Comments) and Rash    headache    Immunization History  Administered Date(s) Administered   Influenza Split 12/23/2016, 12/26/2017, 07/26/2018   Influenza,inj,Quad PF,6+ Mos  08/26/2020   Influenza-Unspecified 12/23/2016, 12/26/2017, 07/26/2018   PFIZER(Purple Top)SARS-COV-2 Vaccination 01/28/2020, 02/18/2020   Pharmacy is [facility].   TP, can you please advise since RA has already left for the day?

## 2021-12-09 NOTE — Patient Instructions (Signed)
Begin Paxlovid for 5 days  Tylenol as needed Fluids and rest as needed Activity as tolerated Mucinex DM twice daily as needed for cough congestion Prednisone 20 mg daily for 5 days Quarantine at home as discussed and may return to work per policy guidelines.  Follow-up in the office in 2 weeks and as needed Please contact office for sooner follow up if symptoms do not improve or worsen or seek emergency care

## 2021-12-09 NOTE — Telephone Encounter (Signed)
Can put on for a video visit today at 4:00 or I can see her tomorrow and a video visit slot. Please contact office for sooner follow up if symptoms do not improve or worsen or seek emergency care

## 2021-12-09 NOTE — Progress Notes (Signed)
Virtual Visit via Video Note  I connected with Elizabeth Riggs on 12/09/21 at  4:00 PM EST by a video enabled telemedicine application and verified that I am speaking with the correct person using two identifiers.  Location: Patient: Home  Provider: Office    I discussed the limitations of evaluation and management by telemedicine and the availability of in person appointments. The patient expressed understanding and agreed to proceed.  History of Present Illness: 50 year old female never smoker followed for sarcoidosis Initial presentation was March 2016 with weight loss, intractable nausea vomiting.  Underwent a lap cholecystectomy with gallbladder lesion noted.  Pathology showed noncaseating granulomas consistent with sarcoid. Patient is a CNA at a nursing home  Today's video visit is an acute office visit.  Patient complains of 2-3 days of cough, nasal congestion, and congestion ,  Works at [employer], was exposed to several patients that are positive for Covid this week.  Today with increased cough and congestion . Tested negative 3 days ago , positive today .  No otc used.  Has body aches, low grade fever, malaise.  Had Covid infection in 2020 .  Covid vaccine x 2 , no booster.  Declines Flu vaccine  Appetite is okay.  No nausea vomiting or diarrhea.  Patient denies any chest pain or hemoptysis..    Past Medical History:  Diagnosis Date   Chronic pain of both knees    Gallstones    GERD (gastroesophageal reflux disease)    Hypertension    Obesity    Sarcoidosis    Thyroid disease     Current Outpatient Medications on File Prior to Visit  Medication Sig Dispense Refill   amoxicillin-clavulanate (AUGMENTIN) 875-125 MG tablet Take 1 tablet by mouth every 12 (twelve) hours. 14 tablet 0   Diclofenac Sodium (PENNSAID) 2 % SOLN Place 1 application onto the skin 2 (two) times daily. 112 g 2   ibuprofen (ADVIL,MOTRIN) 400 MG tablet Take 400 mg by mouth every 6 (six) hours as  needed for moderate pain.     levothyroxine (SYNTHROID, LEVOTHROID) 125 MCG tablet Take 1 tablet (125 mcg total) by mouth daily. 30 tablet 0   omeprazole (PRILOSEC) 20 MG capsule Take 1 capsule (20 mg total) by mouth daily. 30 capsule 0   potassium chloride SA (KLOR-CON) 20 MEQ tablet Take 1 tablet (20 mEq total) by mouth daily for 5 days. 5 tablet 0   potassium chloride SA (KLOR-CON) 20 MEQ tablet Take 2 tablets on first day and then daily. 30 tablet 0   No current facility-administered medications on file prior to visit.        Observations/Objective: Speaks in full sentences with no audible distress.  No increased accessory muscle use noted.  Chest x-ray 12/2014 showed right hilar prominence which was new compared to 05/2014 CT chest 03/2015 - mediastinal lymphadenopathy-subcarinal and precarinal, scattered subcentimeter nodules CT abdomen from 2010 -clear bases PFTs  04/2015 -nml  Echo 08/2015 showed nml  EF,Mild LVH c/w HTN  Spirometry 10/2017  shows slightly decreased to stable lung function with FVC at 70%, FEV1 71%, ratio 83.  Chest x-ray August 2019 mild chronic reticular changes notable in the bases Spirometry August 2019 FEV1 73%, ratio 84, FVC 70%-no significant change since 2018   92020 FEV1 at 78%, ratio is 88, FVC 71%, DLCO 89%.  July 31, 2019 high-resolution CT chest -no pathologically enlarged mediastinal or hilar adenopathy.  Minimal patchy subpleural reticulation and fine nodularity along the fissures and subpleural lungs  bilaterally this is decreased from previous study in 2016, minimal  distortion and minimal traction bronchiectasis lower lung slightly increased from prior  Assessment and Plan: COVID-19 infection.-Patient has been vaccinated x2.  She does have some increased risk factors with underlying sarcoidosis.  Patient denies any known pregnancy.  Patient education given on COVID-19 infection and quarantining practices. Patient is to continue with symptom  management with fluids rest and Tylenol as needed.  Activity as tolerated.  We will begin antiviral therapy with Paxlovid .  Patient education given on Paxlovid .  Recent lab work showed normal kidney function and GFR.  Sarcoidosis appears stable.  Continue to monitor.  Follow-up in 2 weeks  Plan  Patient Instructions  Begin Paxlovid for 5 days  Tylenol as needed Fluids and rest as needed Activity as tolerated Mucinex DM twice daily as needed for cough congestion Prednisone 20 mg daily for 5 days Quarantine at home as discussed and may return to work per policy guidelines.  Follow-up in the office in 2 weeks and as needed Please contact office for sooner follow up if symptoms do not improve or worsen or seek emergency care     Follow Up Instructions:    I discussed the assessment and treatment plan with the patient. The patient was provided an opportunity to ask questions and all were answered. The patient agreed with the plan and demonstrated an understanding of the instructions.   The patient was advised to call back or seek an in-person evaluation if the symptoms worsen or if the condition fails to improve as anticipated.  I provided 30  minutes of non-face-to-face time during this encounter.   Rexene Edison, NP

## 2021-12-13 ENCOUNTER — Telehealth: Payer: Self-pay | Admitting: *Deleted

## 2021-12-13 NOTE — Telephone Encounter (Signed)
ATC patient to schedule 2 wk f/u, left detailed vm (DPR) requesting that she return her call so we can schedule her f/u around 12/29 or 12/30.  Tammy is not here on 12/29, but is here on 12/30, Dr. Elsworth Soho does not have any availability that week.  Will await her return call.

## 2021-12-14 ENCOUNTER — Telehealth: Payer: Self-pay | Admitting: Adult Health

## 2021-12-14 ENCOUNTER — Ambulatory Visit: Payer: BC Managed Care – PPO | Admitting: Rheumatology

## 2021-12-14 DIAGNOSIS — M7661 Achilles tendinitis, right leg: Secondary | ICD-10-CM

## 2021-12-14 DIAGNOSIS — Z8719 Personal history of other diseases of the digestive system: Secondary | ICD-10-CM

## 2021-12-14 DIAGNOSIS — H524 Presbyopia: Secondary | ICD-10-CM

## 2021-12-14 DIAGNOSIS — K141 Geographic tongue: Secondary | ICD-10-CM

## 2021-12-14 DIAGNOSIS — Z8639 Personal history of other endocrine, nutritional and metabolic disease: Secondary | ICD-10-CM

## 2021-12-14 DIAGNOSIS — D869 Sarcoidosis, unspecified: Secondary | ICD-10-CM

## 2021-12-14 DIAGNOSIS — I10 Essential (primary) hypertension: Secondary | ICD-10-CM

## 2021-12-14 DIAGNOSIS — H3550 Unspecified hereditary retinal dystrophy: Secondary | ICD-10-CM

## 2021-12-14 DIAGNOSIS — I872 Venous insufficiency (chronic) (peripheral): Secondary | ICD-10-CM

## 2021-12-14 DIAGNOSIS — D1809 Hemangioma of other sites: Secondary | ICD-10-CM

## 2021-12-14 DIAGNOSIS — M722 Plantar fascial fibromatosis: Secondary | ICD-10-CM

## 2021-12-14 DIAGNOSIS — H2513 Age-related nuclear cataract, bilateral: Secondary | ICD-10-CM

## 2021-12-14 DIAGNOSIS — M170 Bilateral primary osteoarthritis of knee: Secondary | ICD-10-CM

## 2021-12-14 NOTE — Telephone Encounter (Signed)
Patient needs work note to return back to work on 12/20/2021. Patient phone number is [phone].   Rexene Edison NP please advise if it is okay to send letter for retur to work for patient.   Thanks!

## 2021-12-14 NOTE — Telephone Encounter (Signed)
yes

## 2021-12-15 ENCOUNTER — Encounter: Payer: Self-pay | Admitting: *Deleted

## 2021-12-15 NOTE — Telephone Encounter (Signed)
Called and spoke with pt letting her know that TP was fine doing a letter for her to return back to work 12/26. Asked if she wanted to get it off of her mychart and she said she would send someone to the office to get a hard copy of the letter. Letter printed and signed by TP and placed up front in file cabinet for pt. Nothing further needed.

## 2021-12-24 ENCOUNTER — Ambulatory Visit: Payer: BC Managed Care – PPO | Admitting: Adult Health

## 2022-01-11 ENCOUNTER — Ambulatory Visit: Payer: BC Managed Care – PPO | Admitting: Rheumatology

## 2022-01-25 NOTE — Progress Notes (Deleted)
Office Visit Note  Patient: Elizabeth Riggs             Date of Birth: [birth year]           MRN: [id number]             PCP: Elza Rafter, FNP Referring: Rosemarie Ax, MD Visit Date: 02/08/2022 Occupation: @GUAROCC @  Subjective:  No chief complaint on file.   History of Present Illness: Elizabeth Riggs is a 51 y.o. female ***   Activities of Daily Living:  Patient reports morning stiffness for *** {minute/hour:19697}.   Patient {ACTIONS;DENIES/REPORTS:21021675::"Denies"} nocturnal pain.  Difficulty dressing/grooming: {ACTIONS;DENIES/REPORTS:21021675::"Denies"} Difficulty climbing stairs: {ACTIONS;DENIES/REPORTS:21021675::"Denies"} Difficulty getting out of chair: {ACTIONS;DENIES/REPORTS:21021675::"Denies"} Difficulty using hands for taps, buttons, cutlery, and/or writing: {ACTIONS;DENIES/REPORTS:21021675::"Denies"}  No Rheumatology ROS completed.   PMFS History:  Patient Active Problem List   Diagnosis Date Noted   Hemangioma of choroid of right eye 07/08/2021   Nuclear sclerotic cataract of both eyes 07/08/2021   Presbyopia of both eyes 07/08/2021   Visual changes 06/20/2019   Pain of left calf 08/07/2018   Achilles tendinitis of right lower extremity 04/27/2018   Plantar fasciitis of right foot 03/09/2018   Bilateral ankle pain 09/06/2017   Varicose veins of leg with swelling, bilateral 07/27/2017   Venous insufficiency of both lower extremities 07/04/2017   Right shoulder pain 03/03/2017   Macular dystrophy 01/12/2017   Left elbow pain 11/09/2016   Class 3 obesity due to excess calories without serious comorbidity with body mass index (BMI) of 40.0 to 44.9 in adult 11/02/2016   GERD (gastroesophageal reflux disease) 07/10/2015   Acute bronchitis 07/06/2015   Geographic tongue 04/28/2015   Oral thrush 04/28/2015   Sarcoidosis 04/09/2015   Hyperglycemia 04/08/2015   Hypokalemia 03/31/2015   Constipation 03/19/2015   Abdominal pain, epigastric  02/18/2015   Hypothyroidism 02/18/2015   HTN (hypertension) 02/18/2015   OA (osteoarthritis) of knee 05/12/2011    Past Medical History:  Diagnosis Date   Chronic pain of both knees    Gallstones    GERD (gastroesophageal reflux disease)    Hypertension    Obesity    Sarcoidosis    Thyroid disease     Family History  Problem Relation Age of Onset   Hypertension Mother    Diabetes Father    Hypertension Father    Cancer Maternal Grandmother        breast   Cancer Maternal Grandfather        prostate   Heart attack Neg Hx    Past Surgical History:  Procedure Laterality Date   ABDOMINAL HYSTERECTOMY     CHOLECYSTECTOMY  03/12/15   [facility] in [place] Right 01/10/2019   Procedure: Right Achilles tendon debridement, Excision of Haglund;  Surgeon: [name], MD;  Location: [place];  Service: Orthopedics;  Laterality: Right;  70min   GASTROC RECESSION EXTREMITY  01/10/2019   Procedure: GASTROC RECESSION EXTREMITY;  Surgeon: [name], MD;  Location: [place];  Service: Orthopedics;;   KNEE SURGERY     right knee surgery     Social History   Social History Narrative   She works as a Quarry manager at [employer]   Single   3 children- all live locally, she has 3 grand children.   22- son   61- son   70- son   Completed 12th grade   Enjoys church, spending time  with grand children.   Immunization History  Administered Date(s) Administered   Influenza Split 12/23/2016, 12/26/2017, 07/26/2018   Influenza,inj,Quad PF,6+ Mos 08/26/2020   Influenza-Unspecified 12/23/2016, 12/26/2017, 07/26/2018   PFIZER(Purple Top)SARS-COV-2 Vaccination 01/28/2020, 02/18/2020     Objective: Vital Signs: There were no vitals taken for this visit.   Physical Exam   Musculoskeletal Exam: ***  CDAI Exam: CDAI Score: -- Patient Global: --; Provider Global: -- Swollen:  --; Tender: -- Joint Exam 02/08/2022   No joint exam has been documented for this visit   There is currently no information documented on the homunculus. Go to the Rheumatology activity and complete the homunculus joint exam.  Investigation: No additional findings.  Imaging: No results found.  Recent Labs: Lab Results  Component Value Date   WBC 6.0 05/17/2020   HGB 12.8 05/17/2020   PLT 254 05/17/2020   NA 142 03/05/2021   K 2.9 (L) 03/05/2021   CL 104 03/05/2021   CO2 29 03/05/2021   GLUCOSE 142 (H) 03/05/2021   BUN 12 03/05/2021   CREATININE 0.80 03/05/2021   BILITOT 0.3 03/05/2021   ALKPHOS 101 03/05/2021   AST 17 03/05/2021   ALT 15 03/05/2021   PROT 7.5 03/05/2021   ALBUMIN 3.6 03/05/2021   CALCIUM 9.2 03/05/2021   GFRAA >60 05/17/2020    Speciality Comments: No specialty comments available.  Procedures:  No procedures performed Allergies: Morphine and related and Lisinopril   Assessment / Plan:     Visit Diagnoses: Sarcoidosis  Primary osteoarthritis of right knee  Achilles tendinitis of right lower extremity  Plantar fasciitis of right foot  Primary hypertension  Venous insufficiency of both lower extremities  Hemangioma of choroid of right eye  Geographic tongue  History of gastroesophageal reflux (GERD)  History of hypothyroidism  Nuclear sclerotic cataract of both eyes  Presbyopia of both eyes  Orders: No orders of the defined types were placed in this encounter.  No orders of the defined types were placed in this encounter.   Face-to-face time spent with patient was *** minutes. Greater than 50% of time was spent in counseling and coordination of care.  Follow-Up Instructions: No follow-ups on file.   Ofilia Neas, PA-C  Note - This record has been created using Dragon software.  Chart creation errors have been sought, but may not always  have been located. Such creation errors do not reflect on  the standard of medical  care.

## 2022-02-08 ENCOUNTER — Ambulatory Visit: Payer: BC Managed Care – PPO | Admitting: Rheumatology

## 2022-02-08 DIAGNOSIS — M1711 Unilateral primary osteoarthritis, right knee: Secondary | ICD-10-CM

## 2022-02-08 DIAGNOSIS — Z8719 Personal history of other diseases of the digestive system: Secondary | ICD-10-CM

## 2022-02-08 DIAGNOSIS — D1809 Hemangioma of other sites: Secondary | ICD-10-CM

## 2022-02-08 DIAGNOSIS — M7661 Achilles tendinitis, right leg: Secondary | ICD-10-CM

## 2022-02-08 DIAGNOSIS — K141 Geographic tongue: Secondary | ICD-10-CM

## 2022-02-08 DIAGNOSIS — M722 Plantar fascial fibromatosis: Secondary | ICD-10-CM

## 2022-02-08 DIAGNOSIS — H2513 Age-related nuclear cataract, bilateral: Secondary | ICD-10-CM

## 2022-02-08 DIAGNOSIS — H524 Presbyopia: Secondary | ICD-10-CM

## 2022-02-08 DIAGNOSIS — Z8639 Personal history of other endocrine, nutritional and metabolic disease: Secondary | ICD-10-CM

## 2022-02-08 DIAGNOSIS — I872 Venous insufficiency (chronic) (peripheral): Secondary | ICD-10-CM

## 2022-02-08 DIAGNOSIS — D869 Sarcoidosis, unspecified: Secondary | ICD-10-CM

## 2022-02-08 DIAGNOSIS — I10 Essential (primary) hypertension: Secondary | ICD-10-CM

## 2022-03-03 ENCOUNTER — Ambulatory Visit: Payer: BC Managed Care – PPO | Admitting: Rheumatology

## 2022-07-14 ENCOUNTER — Encounter (INDEPENDENT_AMBULATORY_CARE_PROVIDER_SITE_OTHER): Payer: BC Managed Care – PPO | Admitting: Ophthalmology

## 2022-08-11 ENCOUNTER — Encounter (INDEPENDENT_AMBULATORY_CARE_PROVIDER_SITE_OTHER): Payer: BC Managed Care – PPO | Admitting: Ophthalmology

## 2022-10-23 ENCOUNTER — Emergency Department (HOSPITAL_BASED_OUTPATIENT_CLINIC_OR_DEPARTMENT_OTHER)
Admission: EM | Admit: 2022-10-23 | Discharge: 2022-10-23 | Disposition: A | Discharge status: Home / Self Care | Payer: BC Managed Care – PPO | Attending: Emergency Medicine | Admitting: Emergency Medicine

## 2022-10-23 ENCOUNTER — Encounter (HOSPITAL_BASED_OUTPATIENT_CLINIC_OR_DEPARTMENT_OTHER): Payer: Self-pay | Admitting: Emergency Medicine

## 2022-10-23 DIAGNOSIS — E876 Hypokalemia: Secondary | ICD-10-CM | POA: Diagnosis not present

## 2022-10-23 DIAGNOSIS — M62838 Other muscle spasm: Secondary | ICD-10-CM

## 2022-10-23 DIAGNOSIS — Z79899 Other long term (current) drug therapy: Secondary | ICD-10-CM | POA: Diagnosis not present

## 2022-10-23 LAB — CBC WITH DIFFERENTIAL/PLATELET
Abs Immature Granulocytes: 0.02 10*3/uL (ref 0.00–0.07)
Basophils Absolute: 0.1 10*3/uL (ref 0.0–0.1)
Basophils Relative: 1 %
Eosinophils Absolute: 0.1 10*3/uL (ref 0.0–0.5)
Eosinophils Relative: 1 %
HCT: 41.2 % (ref 36.0–46.0)
Hemoglobin: 13.9 g/dL (ref 12.0–15.0)
Immature Granulocytes: 0 %
Lymphocytes Relative: 35 %
Lymphs Abs: 2.7 10*3/uL (ref 0.7–4.0)
MCH: 28.5 pg (ref 26.0–34.0)
MCHC: 33.7 g/dL (ref 30.0–36.0)
MCV: 84.6 fL (ref 80.0–100.0)
Monocytes Absolute: 0.8 10*3/uL (ref 0.1–1.0)
Monocytes Relative: 10 %
Neutro Abs: 4 10*3/uL (ref 1.7–7.7)
Neutrophils Relative %: 53 %
Platelets: 356 10*3/uL (ref 150–400)
RBC: 4.87 MIL/uL (ref 3.87–5.11)
RDW: 15.3 % (ref 11.5–15.5)
WBC: 7.6 10*3/uL (ref 4.0–10.5)
nRBC: 0 % (ref 0.0–0.2)

## 2022-10-23 LAB — MAGNESIUM: Magnesium: 2.1 mg/dL (ref 1.7–2.4)

## 2022-10-23 LAB — BASIC METABOLIC PANEL
Anion gap: 10 (ref 5–15)
BUN: 14 mg/dL (ref 6–20)
CO2: 31 mmol/L (ref 22–32)
Calcium: 9.8 mg/dL (ref 8.9–10.3)
Chloride: 99 mmol/L (ref 98–111)
Creatinine, Ser: 0.94 mg/dL (ref 0.44–1.00)
GFR, Estimated: >60 mL/min (ref >60)
Glucose, Bld: 130 mg/dL — ABNORMAL HIGH (ref 70–99)
Potassium: 2.6 mmol/L — CL (ref 3.5–5.1)
Sodium: 140 mmol/L (ref 135–145)

## 2022-10-23 MED ORDER — POTASSIUM CHLORIDE CRYS ER 20 MEQ PO TBCR
40.0000 meq | EXTENDED_RELEASE_TABLET | Freq: Once | ORAL | Status: AC
  Administered 2022-10-23: 40 meq via ORAL
  Filled 2022-10-23: qty 2

## 2022-10-23 MED ORDER — POTASSIUM CHLORIDE CRYS ER 20 MEQ PO TBCR: 40.0000 meq | EXTENDED_RELEASE_TABLET | Freq: Every day | ORAL | 0 refills | Status: AC

## 2022-10-23 MED ORDER — POTASSIUM CHLORIDE 10 MEQ/100ML IV SOLN
10.0000 meq | INTRAVENOUS | Status: AC
  Administered 2022-10-23 (×2): 10 meq via INTRAVENOUS
  Filled 2022-10-23 (×2): qty 100

## 2022-10-23 NOTE — Discharge Instructions (Signed)
Take potassium supplementation as prescribed.  Follow-up with your primary care doctor later this week to have your potassium rechecked.

## 2022-10-23 NOTE — ED Notes (Signed)
D/c paperwork reviewed with pt, including prescription and f/u care. Pt verbalized understanding, no questions or concerns at time of d/c. Pt ambulatory to ED exit without assistance, with family.

## 2022-10-23 NOTE — ED Provider Notes (Signed)
[facility] EMERGENCY DEPARTMENT Provider Note   CSN: [id number] Arrival date & time: 10/23/22  1500     History  Chief Complaint  Patient presents with   Fatigue    Elizabeth Riggs is a 51 y.o. female.  Patient is here with muscle spasms.  Elizabeth thinks that her potassium is low.  Elizabeth recently restarted her fluid blood pressure medicine which has caused low potassium in the past.  Elizabeth has not started potassium supplementation.  Denies any shortness of breath but has had some tingling, spasms in her chest and legs.  Has had some fatigue.  Denies any nausea or vomiting.  No abdominal pain, headache, weakness, vision loss.  The history is provided by the patient.       Home Medications Prior to Admission medications   Medication Sig Start Date End Date Taking? Authorizing Provider  amoxicillin-clavulanate (AUGMENTIN) 875-125 MG tablet Take 1 tablet by mouth every 12 (twelve) hours. 10/10/21   Sherrell Puller, PA-C  Diclofenac Sodium (PENNSAID) 2 % SOLN Place 1 application onto the skin 2 (two) times daily. 10/22/21   Rosemarie Ax, MD  ibuprofen (ADVIL,MOTRIN) 400 MG tablet Take 400 mg by mouth every 6 (six) hours as needed for moderate pain.    [provider]  levothyroxine (SYNTHROID, LEVOTHROID) 125 MCG tablet Take 1 tablet (125 mcg total) by mouth daily. 03/23/17   Parrett, Fonnie Mu, NP  nirmatrelvir/ritonavir EUA (PAXLOVID) 20 x 150 MG & 10 x '100MG'$  TABS Take 3 tabs twice daily , Take nirmatrelvir (150 mg) two tablets twice daily for 5 days and ritonavir (100 mg) one tablet twice daily for 5 days. 12/09/21   Parrett, Fonnie Mu, NP  omeprazole (PRILOSEC) 20 MG capsule Take 1 capsule (20 mg total) by mouth daily. 03/09/17   Malvin Johns, MD  potassium chloride SA (KLOR-CON M) 20 MEQ tablet Take 2 tablets (40 mEq total) by mouth daily for 7 days. 10/23/22 10/30/22  Shedric Fredericks, DO  predniSONE (DELTASONE) 20 MG tablet Take 1 tablet (20 mg total) by mouth daily with  breakfast. 12/09/21   Parrett, Fonnie Mu, NP      Allergies    Morphine and related and Lisinopril    Review of Systems   Review of Systems  Physical Exam Updated Vital Signs BP 119/63   Pulse 74   Temp 98.2 F (36.8 C) (Oral)   Resp 17   Ht '5\' 5"'$  (1.651 m)   Wt 113.4 kg   SpO2 96%   BMI 41.60 kg/m  Physical Exam Vitals and nursing note reviewed.  Constitutional:      General: Elizabeth is not in acute distress.    Appearance: Elizabeth is well-developed. Elizabeth is not ill-appearing.  HENT:     Head: Normocephalic and atraumatic.     Mouth/Throat:     Mouth: Mucous membranes are moist.  Eyes:     Extraocular Movements: Extraocular movements intact.     Conjunctiva/sclera: Conjunctivae normal.     Pupils: Pupils are equal, round, and reactive to light.  Cardiovascular:     Rate and Rhythm: Normal rate and regular rhythm.     Pulses: Normal pulses.     Heart sounds: Normal heart sounds. No murmur heard. Pulmonary:     Effort: Pulmonary effort is normal. No respiratory distress.     Breath sounds: Normal breath sounds.  Abdominal:     Palpations: Abdomen is soft.     Tenderness: There is no abdominal tenderness.  Musculoskeletal:  General: No swelling.     Cervical back: Normal range of motion and neck supple.  Skin:    General: Skin is warm and dry.     Capillary Refill: Capillary refill takes less than 2 seconds.  Neurological:     General: No focal deficit present.     Mental Status: Elizabeth is alert.  Psychiatric:        Mood and Affect: Mood normal.     ED Results / Procedures / Treatments   Labs (all labs ordered are listed, but only abnormal results are displayed) Labs Reviewed  BASIC METABOLIC PANEL - Abnormal; Notable for the following components:      Result Value   Potassium 2.6 (*)    Glucose, Bld 130 (*)    All other components within normal limits  CBC WITH DIFFERENTIAL/PLATELET  MAGNESIUM    EKG EKG Interpretation  Date/Time:  'Sunday October 23 2022 15:18:31 EDT Ventricular Rate:  98 PR Interval:  138 QRS Duration: 96 QT Interval:  372 QTC Calculation: 474 R Axis:   -24 Text Interpretation: Normal sinus rhythm Right atrial enlargement Confirmed by Alessandra Sawdey ([id number]) on 10/23/2022 3:33:52 PM  Radiology No results found.  Procedures Procedures    Medications Ordered in ED Medications  potassium chloride 10 mEq in 100 mL IVPB (10 mEq Intravenous New Bag/Given 10/23/22 1823)  potassium chloride SA (KLOR-CON M) CR tablet 40 mEq (40 mEq Oral Given 10/23/22 1722)    ED Course/ Medical Decision Making/ A&P                           Medical Decision Making Amount and/or Complexity of Data Reviewed Labs: ordered.  Risk Prescription drug management.   Brezlyn F Sian is here with muscle spasms concern for low potassium.  Normal vitals.  No fever.  Recently restarted blood pressure medicine that lowers her potassium Elizabeth states.  Elizabeth feels like her potassium is low.  Elizabeth has had some fatigue and muscle spasms.  Elizabeth has not been on potassium supplementation.  Denies any shortness of breath, abdominal pain.  EKG per my review interpretation shows sinus rhythm.  No ischemic changes.  Differential diagnosis is likely muscular process versus electrolyte issue.  No concern for ACS or PE.  No concern for infectious process.  CBC, BMP, magnesium drawn.  Per my review and interpretation of labs, potassium is 2.6.  Magnesium is 2.1.  Lab work is otherwise unremarkable.  We will give IV potassium and oral potassium and discharged with oral potassium.  Patient feeling better.  Will discharge.  Will write for oral potassium supplementation.  Will need to follow-up with primary care doctor to have potassium rechecked later this week.  Elizabeth understands return precautions.  This chart was dictated using voice recognition software.  Despite best efforts to proofread,  errors can occur which can change the documentation meaning.          Final Clinical Impression(s) / ED Diagnoses Final diagnoses:  Hypokalemia  Muscle spasm    Rx / DC Orders ED Discharge Orders          Ordered    potassium chloride SA (KLOR-CON M) 20 MEQ tablet  Daily        10'$ /29/23 [place], Aleeya Veitch, DO 10/23/22 1853

## 2022-10-23 NOTE — ED Triage Notes (Signed)
Pt reports fatigue x 1 wk, CP since yesterday; feels similar to when her K+ was low before

## 2022-11-07 ENCOUNTER — Ambulatory Visit (INDEPENDENT_AMBULATORY_CARE_PROVIDER_SITE_OTHER): Payer: BC Managed Care – PPO

## 2022-11-07 ENCOUNTER — Encounter: Payer: Self-pay | Admitting: Adult Health

## 2022-11-07 ENCOUNTER — Ambulatory Visit (INDEPENDENT_AMBULATORY_CARE_PROVIDER_SITE_OTHER): Payer: BC Managed Care – PPO | Admitting: Adult Health

## 2022-11-07 VITALS — BP 122/80 | HR 67 | Temp 98.2°F | Ht 65.0 in | Wt 267.6 lb

## 2022-11-07 DIAGNOSIS — R0609 Other forms of dyspnea: Secondary | ICD-10-CM

## 2022-11-07 DIAGNOSIS — D869 Sarcoidosis, unspecified: Secondary | ICD-10-CM | POA: Diagnosis not present

## 2022-11-07 DIAGNOSIS — E876 Hypokalemia: Secondary | ICD-10-CM | POA: Diagnosis not present

## 2022-11-07 LAB — BASIC METABOLIC PANEL
BUN: 11 mg/dL (ref 6–23)
CO2: 31 mEq/L (ref 19–32)
Calcium: 9.2 mg/dL (ref 8.4–10.5)
Chloride: 104 mEq/L (ref 96–112)
Creatinine, Ser: 0.79 mg/dL (ref 0.40–1.20)
GFR: 86.49 mL/min (ref >60.00)
Glucose, Bld: 109 mg/dL — ABNORMAL HIGH (ref 70–99)
Potassium: 3.3 mEq/L — ABNORMAL LOW (ref 3.5–5.1)
Sodium: 142 mEq/L (ref 135–145)

## 2022-11-07 LAB — BRAIN NATRIURETIC PEPTIDE: Pro B Natriuretic peptide (BNP): 34 pg/mL (ref 0.0–100.0)

## 2022-11-07 MED ORDER — PREDNISONE 10 MG PO TABS: ORAL_TABLET | ORAL | 0 refills | Status: DC

## 2022-11-07 NOTE — Progress Notes (Signed)
_0  ID: Elizabeth Riggs, female    DOB: 06/26/1971, 51 y.o.   MRN: [id number]  Chief Complaint  Patient presents with   Follow-up    Referring provider: Elza Rafter, FNP  HPI: 51 year old female never smoker followed for sarcoidosis with cutaneous, lung and gallbladder involvement.  Initial presentation was March 2016 with weight loss, nausea vomiting.  Underwent cholecystectomy with gallbladder lesion noted.  Pathology showed noncaseating granulomas consistent with sarcoidosis. Patient is a CNA at a nursing home  TEST/EVENTS :  Chest x-ray 12/2014 showed right hilar prominence which was new compared to 05/2014  CT chest 03/2015 - mediastinal lymphadenopathy-subcarinal and precarinal, scattered subcentimeter nodules  lesion on her face that was biopsied-showed sarcoidosis and is improved with steroid ointment   CT abdomen from 2010 -clear bases PFTs  04/2015 -nml   Echo 08/2015 showed nml  EF,Mild LVH c/w HTN   Spirometry 10/2017  shows slightly decreased to stable lung function with FVC at 70%, FEV1 71%, ratio 83.   Chest x-ray August 2019 mild chronic reticular changes notable in the bases  Spirometry August 2019 FEV1 73%, ratio 84, FVC 70%-no significant change since 2018   92020 FEV1 at 78%, ratio is 88, FVC 71%, DLCO 89%.   July 31, 2019 high-resolution CT chest -no pathologically enlarged mediastinal or hilar adenopathy.  Minimal patchy subpleural reticulation and fine nodularity along the fissures and subpleural lungs bilaterally this is decreased from previous study in 2016, minimal  distortion and minimal traction bronchiectasis lower lung slightly increased from prior  11/07/2022 Follow up; Sarcoidosis  Patient returns for a follow-up visit.  Last visit was a virtual visit December 09, 2021.  Patient is followed for sarcoidosis with multisystem involvement.  Patient complains over the last few weeks she has had increased fatigue, joint pains , nodular rash  along legs, visual changes.  Patient was seen by primary care provider . Found to have ongoing hypokalemia.  She was changed from hydrochlorothiazide to Aldactone.  Calcium and liver function levels were normal.  CRP and sed rate were elevated. Referred back to our office for possible sarcoid flare.  She denies any cough congestion wheezing or shortness of breath.  Did have some chest wall pain. No hemoptysis or joint swelling.  Chest x-ray June 22, 2022-report in care everywhere showed clear lungs. Patient says her rash has resolved over the last few days.   Allergies  Allergen Reactions   Morphine And Related Itching   Lisinopril Other (See Comments) and Rash    headache    Immunization History  Administered Date(s) Administered   Influenza Split 12/23/2016, 12/26/2017, 07/26/2018   Influenza,inj,Quad PF,6+ Mos 08/26/2020   Influenza-Unspecified 12/23/2016, 12/26/2017, 07/26/2018   Moderna Sars-Covid-2 Vaccination 01/07/2020   PFIZER Comirnaty(Gray Top)Covid-19 Tri-Sucrose Vaccine 01/28/2020, 02/18/2020   PFIZER(Purple Top)SARS-COV-2 Vaccination 01/28/2020, 02/18/2020    Past Medical History:  Diagnosis Date   Chronic pain of both knees    Gallstones    GERD (gastroesophageal reflux disease)    Hypertension    Obesity    Sarcoidosis    Thyroid disease     Tobacco History: Social History   Tobacco Use  Smoking Status Never  Smokeless Tobacco Never   Counseling given: Not Answered   Outpatient Medications Prior to Visit  Medication Sig Dispense Refill   Diclofenac Sodium (PENNSAID) 2 % SOLN Place 1 application onto the skin 2 (two) times daily. 112 g 2   ibuprofen (ADVIL,MOTRIN) 400 MG tablet Take 400 mg by mouth  every 6 (six) hours as needed for moderate pain.     levothyroxine (SYNTHROID, LEVOTHROID) 125 MCG tablet Take 1 tablet (125 mcg total) by mouth daily. 30 tablet 0   omeprazole (PRILOSEC) 20 MG capsule Take 1 capsule (20 mg total) by mouth daily. 30 capsule 0    spironolactone (ALDACTONE) 25 MG tablet Take 25 mg by mouth daily.     potassium chloride SA (KLOR-CON M) 20 MEQ tablet Take 2 tablets (40 mEq total) by mouth daily for 7 days. 14 tablet 0   amoxicillin-clavulanate (AUGMENTIN) 875-125 MG tablet Take 1 tablet by mouth every 12 (twelve) hours. (Patient not taking: Reported on 11/07/2022) 14 tablet 0   nirmatrelvir/ritonavir EUA (PAXLOVID) 20 x 150 MG & 10 x 100MG TABS Take 3 tabs twice daily , Take nirmatrelvir (150 mg) two tablets twice daily for 5 days and ritonavir (100 mg) one tablet twice daily for 5 days. (Patient not taking: Reported on 11/07/2022) 30 tablet 0   predniSONE (DELTASONE) 20 MG tablet Take 1 tablet (20 mg total) by mouth daily with breakfast. (Patient not taking: Reported on 11/07/2022) 5 tablet 0   No facility-administered medications prior to visit.     Review of Systems:   Constitutional:   No  weight loss, night sweats,  Fevers, chills,  +fatigue, or  lassitude.  HEENT:   No headaches,  Difficulty swallowing,  Tooth/dental problems, or  Sore throat,                No sneezing, itching, ear ache, nasal congestion, post nasal drip,   CV:  No chest pain,  Orthopnea, PND, swelling in lower extremities, anasarca, dizziness, palpitations, syncope.   GI  No heartburn, indigestion, abdominal pain, nausea, vomiting, diarrhea, change in bowel habits, loss of appetite, bloody stools.   Resp: No shortness of breath with exertion or at rest.  No excess mucus, no productive cough,  No non-productive cough,  No coughing up of blood.  No change in color of mucus.  No wheezing.  No chest wall deformity  Skin: +rash or lesions.  GU: no dysuria, change in color of urine, no urgency or frequency.  No flank pain, no hematuria   MS:  No joint pain or swelling.  No decreased range of motion.  No back pain.    Physical Exam  BP 122/80 (BP Location: Left Arm, Patient Position: Sitting, Cuff Size: Large)   Pulse 67   Temp 98.2 F  (36.8 C) (Oral)   Ht _0  (1.651 m)   Wt 267 lb 9.6 oz (121.4 kg)   SpO2 97%   BMI 44.53 kg/m   GEN: A/Ox3; pleasant , NAD, well nourished    HEENT:  [place]/AT,   NOSE-clear, THROAT-clear, no lesions, no postnasal drip or exudate noted.   NECK:  Supple w/ fair ROM; no JVD; normal carotid impulses w/o bruits; no thyromegaly or nodules palpated; no lymphadenopathy.    RESP  Clear  P & A; w/o, wheezes/ rales/ or rhonchi. no accessory muscle use, no dullness to percussion  CARD:  RRR, no m/r/g, no peripheral edema, pulses intact, no cyanosis or clubbing.  GI:   Soft & nt; nml bowel sounds; no organomegaly or masses detected.   Musco: Warm bil, no deformities or joint swelling noted.   Neuro: alert, no focal deficits noted.    Skin: Warm, no lesions or rashes    Lab Results:  CBC    Component Value Date/Time   WBC 7.6 10/23/2022 1548  RBC 4.87 10/23/2022 1548   HGB 13.9 10/23/2022 1548   HCT 41.2 10/23/2022 1548   PLT 356 10/23/2022 1548   MCV 84.6 10/23/2022 1548   MCH 28.5 10/23/2022 1548   MCHC 33.7 10/23/2022 1548   RDW 15.3 10/23/2022 1548   LYMPHSABS 2.7 10/23/2022 1548   MONOABS 0.8 10/23/2022 1548   EOSABS 0.1 10/23/2022 1548   BASOSABS 0.1 10/23/2022 1548    BMET    Component Value Date/Time   NA 142 11/07/2022 1020   K 3.3 (L) 11/07/2022 1020   CL 104 11/07/2022 1020   CO2 31 11/07/2022 1020   GLUCOSE 109 (H) 11/07/2022 1020   BUN 11 11/07/2022 1020   CREATININE 0.79 11/07/2022 1020   CREATININE 0.78 01/08/2016 1540   CALCIUM 9.2 11/07/2022 1020   GFRNONAA >60 10/23/2022 1548   GFRAA >60 05/17/2020 1615    BNP No results found for: "BNP"  ProBNP    Component Value Date/Time   PROBNP 34.0 11/07/2022 1020    Imaging: DG Chest 2 View  Result Date: 11/07/2022 CLINICAL DATA:  Sarcoidosis EXAM: CHEST - 2 VIEW COMPARISON:  06/24/2021 FINDINGS: The heart size and mediastinal contours are within normal limits. Both lungs are clear. The visualized  skeletal structures are unremarkable. IMPRESSION: No active cardiopulmonary disease. Electronically Signed   By: Rolm Baptise M.D.   On: 11/07/2022 10:42         Latest Ref Rng & Units 08/27/2019    9:09 AM 05/12/2015   11:52 AM  PFT Results  FVC-Pre L 2.32  P 2.73   FVC-Predicted Pre % 73  P 84   FVC-Post L 2.26  P 2.60   FVC-Predicted Post % 71  P 80   Pre FEV1/FVC % % 84  P 87   Post FEV1/FCV % % 88  P 88   FEV1-Pre L 1.95  P 2.36   FEV1-Predicted Pre % 76  P 89   FEV1-Post L 2.00  P 2.30   DLCO uncorrected ml/min/mmHg 20.24  P 22.68   DLCO UNC% % 89  P 84   DLVA Predicted % 145  P 125   TLC L 3.50  P 3.80   TLC % Predicted % 65  P 71   RV % Predicted % 61  P 62     P Preliminary result    No results found for: "NITRICOXIDE"      Assessment & Plan:   Sarcoidosis Sarcoidosis with known cutaneous, pulmonary, gallbladder involvement.  Possible recent flare with elevated sed and CRP with joint pain, nodular rash, fatigue, visual changes. Patient is advised to follow-up with ophthalmology.  Labs including ACE level, be met and BNP pending.  Check chest x-ray today.  Will need PFTs on return.  We will decide on follow-up visit if CT chest is indicated. Patient with significant recurrent hypokalemia.  Calcium levels are normal. Has recently been changed from hydrochlorothiazide to spironolactone. If hypokalemia not improving would consider connective tissue/autoimmune work-up with Sjogren's panel and ANCA +/-nephrology referral.  We will begin short steroid burst over the next 10 days for sarcoid flare.   Plan    Patient Instructions  Prednisone 33m daily for 5 days then 141mdaily for 5 days and stop .  Chest xray today .  Labs today .  Follow up with Ophthalmology as discussed.  Follow up with Dr. AlElsworth Sohoor Cari Vandeberg in 3-4 weeks with PFT and As needed   Please contact office for sooner follow  up if symptoms do not improve or worsen or seek emergency care       Hypokalemia Chronically low potassium level questionable etiology.  Recently changed from hydrochlorothiazide to spironolactone.  Check be met today.  If continues to have low potassium consider autoimmune/connective tissue work-up plus or minus nephrology referral    I spent  42  minutes dedicated to the care of this patient on the date of this encounter to include pre-visit review of records, face-to-face time with the patient discussing conditions above, post visit ordering of testing, clinical documentation with the electronic health record, making appropriate referrals as documented, and communicating necessary findings to members of the patients care team.   Rexene Edison, NP 11/07/2022

## 2022-11-07 NOTE — Assessment & Plan Note (Addendum)
Sarcoidosis with known cutaneous, pulmonary, gallbladder involvement.  Possible recent flare with elevated sed and CRP with joint pain, nodular rash, fatigue, visual changes. Patient is advised to follow-up with ophthalmology.  Labs including ACE level, be met and BNP pending.  Check chest x-ray today.  Will need PFTs on return.  We will decide on follow-up visit if CT chest is indicated. Patient with significant recurrent hypokalemia.  Calcium levels are normal. Has recently been changed from hydrochlorothiazide to spironolactone. If hypokalemia not improving would consider connective tissue/autoimmune work-up with Sjogren's panel and ANCA +/-nephrology referral.  We will begin short steroid burst over the next 10 days for sarcoid flare.   Plan    Patient Instructions  Prednisone 7m daily for 5 days then 132mdaily for 5 days and stop .  Chest xray today .  Labs today .  Follow up with Ophthalmology as discussed.  Follow up with Dr. AlElsworth Sohoor Tezra Mahr in 3-4 weeks with PFT and As needed   Please contact office for sooner follow up if symptoms do not improve or worsen or seek emergency care

## 2022-11-07 NOTE — Assessment & Plan Note (Addendum)
Chronically low potassium level questionable etiology.  Recently changed from hydrochlorothiazide to spironolactone.  Check be met today.  If continues to have low potassium consider autoimmune/connective tissue work-up plus or minus nephrology referral

## 2022-11-07 NOTE — Addendum Note (Signed)
Addended by: Vanessa Barbara on: 11/07/2022 03:22 PM   Modules accepted: Orders

## 2022-11-07 NOTE — Patient Instructions (Addendum)
Prednisone '20mg'$  daily for 5 days then '10mg'$  daily for 5 days and stop .  Chest xray today .  Labs today .  Follow up with Ophthalmology as discussed.  Follow up with Dr. Elsworth Soho  or Oberon Hehir in 3-4 weeks with PFT and As needed   Please contact office for sooner follow up if symptoms do not improve or worsen or seek emergency care

## 2022-11-09 LAB — ANGIOTENSIN CONVERTING ENZYME: Angiotensin-Converting Enzyme: 68 U/L — ABNORMAL HIGH (ref 9–67)

## 2022-11-16 ENCOUNTER — Telehealth: Payer: Self-pay | Admitting: Adult Health

## 2022-11-16 NOTE — Telephone Encounter (Signed)
Attempted to call pt but line went directly to VM. Left message for her to return call. °

## 2022-11-18 MED ORDER — PREDNISONE 10 MG PO TABS: ORAL_TABLET | ORAL | 0 refills | Status: DC

## 2022-11-18 NOTE — Telephone Encounter (Signed)
Called and discussed - likely viral URI. No worsening dyspnea. Still on pred taper. No fever. I extended prednisone taper and encouraged her to call back Monday if worsening/persistent sinus congestion and/or high fever at which point we could consider ABX course.  [place]

## 2022-11-18 NOTE — Telephone Encounter (Signed)
Called and spoke with pt who states she has congestion in her head almost like she has a head cold. Pt said that she is coughing getting up yellow phlegm and when she ambulates, she does have some wheezing.  Pt has been taking robitussin for her cough which she states is helping some.  Pt wants to know what could be recommended to help with her symptoms.  Dr. Verlee Monte, please advise.

## 2022-12-05 ENCOUNTER — Encounter: Payer: Self-pay | Admitting: Adult Health

## 2022-12-05 ENCOUNTER — Ambulatory Visit (INDEPENDENT_AMBULATORY_CARE_PROVIDER_SITE_OTHER): Payer: BC Managed Care – PPO | Admitting: Adult Health

## 2022-12-05 ENCOUNTER — Ambulatory Visit (INDEPENDENT_AMBULATORY_CARE_PROVIDER_SITE_OTHER): Payer: BC Managed Care – PPO | Admitting: Pulmonary Disease

## 2022-12-05 VITALS — BP 120/74 | HR 70 | Temp 97.9°F | Ht 65.0 in | Wt 262.0 lb

## 2022-12-05 DIAGNOSIS — R0609 Other forms of dyspnea: Secondary | ICD-10-CM

## 2022-12-05 DIAGNOSIS — D869 Sarcoidosis, unspecified: Secondary | ICD-10-CM | POA: Diagnosis not present

## 2022-12-05 DIAGNOSIS — E876 Hypokalemia: Secondary | ICD-10-CM | POA: Diagnosis not present

## 2022-12-05 LAB — PULMONARY FUNCTION TEST
DL/VA % pred: 125 %
DL/VA: 5.31 ml/min/mmHg/L
DLCO cor % pred: 90 %
DLCO cor: 20.25 ml/min/mmHg
DLCO unc % pred: 91 %
DLCO unc: 20.55 ml/min/mmHg
FEF 25-75 Post: 3.34 L/sec
FEF 25-75 Pre: 3.43 L/sec
FEF2575-%Change-Post: -2 %
FEF2575-%Pred-Post: 117 %
FEF2575-%Pred-Pre: 120 %
FEV1-%Change-Post: -3 %
FEV1-%Pred-Post: 69 %
FEV1-%Pred-Pre: 71 %
FEV1-Post: 2.07 L
FEV1-Pre: 2.14 L
FEV1FVC-%Change-Post: 0 %
FEV1FVC-%Pred-Pre: 112 %
FEV6-%Change-Post: -4 %
FEV6-%Pred-Post: 62 %
FEV6-%Pred-Pre: 64 %
FEV6-Post: 2.28 L
FEV6-Pre: 2.38 L
FEV6FVC-%Pred-Post: 102 %
FEV6FVC-%Pred-Pre: 102 %
FVC-%Change-Post: -3 %
FVC-%Pred-Post: 60 %
FVC-%Pred-Pre: 63 %
FVC-Post: 2.29 L
FVC-Pre: 2.38 L
Post FEV1/FVC ratio: 90 %
Post FEV6/FVC ratio: 100 %
Pre FEV1/FVC ratio: 90 %
Pre FEV6/FVC Ratio: 100 %
RV % pred: 79 %
RV: 1.52 L
TLC % pred: 75 %
TLC: 4.04 L

## 2022-12-05 MED ORDER — ALBUTEROL SULFATE HFA 108 (90 BASE) MCG/ACT IN AERS: 1.0000 | INHALATION_SPRAY | Freq: Four times a day (QID) | RESPIRATORY_TRACT | 2 refills | Status: AC | PRN

## 2022-12-05 NOTE — Assessment & Plan Note (Signed)
Suspected recent sarcoid flare.  Now seems to be improved with prednisone.  PFTs appear stable.  Will hold off on CT chest for now.  Recent chest x-ray was clear.  Encourage patient continue with yearly ophthalmology follow-up.  Plan  Patient Instructions  Follow up with Ophthalmology for sarcoid.  Activity as tolerated.  Work on healthy weight.  Zyrtec '10mg'$  At bedtime  As needed  for drainage  Delsym 2 tsp Twice daily  As needed  for cough .  Saline nasal gel At bedtime   Albuterol inhaler As needed  wheezing /cough or shortness of breath .  Follow up with Dr. Elsworth Soho or Anevay Campanella NP in 4-6 months and As needed

## 2022-12-05 NOTE — Patient Instructions (Addendum)
Follow up with Ophthalmology for sarcoid.  Activity as tolerated.  Work on healthy weight.  Zyrtec '10mg'$  At bedtime  As needed  for drainage  Delsym 2 tsp Twice daily  As needed  for cough .  Saline nasal gel At bedtime   Albuterol inhaler As needed  wheezing /cough or shortness of breath .  Follow up with Dr. Elsworth Soho or Jessee Newnam NP in 4-6 months and As needed

## 2022-12-05 NOTE — Progress Notes (Signed)
$'@Patient'j$  ID: Elizabeth Riggs, female    DOB: 07-14-1971, 51 y.o.   MRN: [id number]  Chief Complaint  Patient presents with   Follow-up    Referring provider: Elza Rafter, FNP  HPI: 51 year old female never smoker followed for sarcoidosis with cutaneous, lung and gallbladder involvement.  Initial presentation was March 2016 with weight loss, nausea and vomiting.  Underwent cholecystectomy with gallbladder lesion noted.  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    Chest x-ray August 2019 mild chronic reticular changes notable in the bases   Spirometry August 2019 FEV1 73%, ratio 84, FVC 70%-no significant change since 2018   92020 FEV1 at 78%, ratio is 88, FVC 71%, DLCO 89%.   July 31, 2019 high-resolution CT chest -no pathologically enlarged mediastinal or hilar adenopathy.  Minimal patchy subpleural reticulation and fine nodularity along the fissures and subpleural lungs bilaterally this is decreased from previous study in 2016, minimal  distortion and minimal traction bronchiectasis lower lung slightly increased from prior  12/05/2022 Follow up : Sarcoid  Patient returns for 1 month follow-up.  Patient has underlying sarcoidosis with multisystem involvement.  Over the last several weeks she had had an initial fatigue, joint pain nodular rash along the legs and visual changes.  Patient also had cough and  upper respiratory infection.  She was given prednisone 20 mg for 5 days and then 10 mg for 5 days.  Patient says that symptoms did improve.  Joint pain has decreased and she had a slight couple bumps on her chest wall when she was on prednisone but those resolved as well. PFTs done today show moderate restriction with FEV1 at 71%, ratio 90, FVC 63%, DLCO 91%..  Diffusing capacity remained stable since 2020. Chest x-ray last month showed clear lungs.  ACE inhibitor was stable at 68.  BNP and LFTs were stable. Patient denies any chest pain orthopnea.  Does say that she has a dry cough that is worse at nighttime.   Allergies  Allergen Reactions   Morphine And Related Itching   Lisinopril Other (See Comments) and Rash    headache    Immunization History  Administered Date(s) Administered   Influenza Split 12/23/2016, 12/26/2017, 07/26/2018   Influenza,inj,Quad PF,6+ Mos 08/26/2020   Influenza-Unspecified 12/23/2016, 12/26/2017, 07/26/2018   Moderna Sars-Covid-2 Vaccination 01/07/2020   PFIZER Comirnaty(Gray Top)Covid-19 Tri-Sucrose Vaccine 01/28/2020, 02/18/2020   PFIZER(Purple Top)SARS-COV-2 Vaccination 01/28/2020, 02/18/2020    Past Medical History:  Diagnosis Date   Chronic pain of both knees    Gallstones    GERD (gastroesophageal reflux disease)    Hypertension    Obesity    Sarcoidosis    Thyroid disease     Tobacco History: Social History   Tobacco Use  Smoking Status Never  Smokeless Tobacco Never   Counseling given: Not Answered   Outpatient Medications Prior to Visit  Medication Sig Dispense Refill   Diclofenac Sodium (PENNSAID) 2 %  SOLN Place 1 application onto the skin 2 (two) times daily. 112 g 2   ibuprofen (ADVIL,MOTRIN) 400 MG tablet Take 400 mg by mouth every 6 (six) hours as needed for moderate pain.     levothyroxine (SYNTHROID, LEVOTHROID) 125 MCG tablet Take 1 tablet (125 mcg total) by mouth daily. 30 tablet 0   omeprazole (PRILOSEC) 20 MG capsule  Take 1 capsule (20 mg total) by mouth daily. 30 capsule 0   potassium chloride SA (KLOR-CON M) 20 MEQ tablet Take 2 tablets (40 mEq total) by mouth daily for 7 days. 14 tablet 0   predniSONE (DELTASONE) 10 MG tablet 2 tabs for 5 days, then 1 tab for 5 days, then stop 15 tablet 0   spironolactone (ALDACTONE) 25 MG tablet Take 25 mg by mouth daily.     No facility-administered medications prior to visit.     Review of Systems:   Constitutional:   No  weight loss, night sweats,  Fevers, chills,  +fatigue, or  lassitude.  HEENT:   No headaches,  Difficulty swallowing,  Tooth/dental problems, or  Sore throat,                No sneezing, itching, ear ache, nasal congestion, post nasal drip,   CV:  No chest pain,  Orthopnea, PND, swelling in lower extremities, anasarca, dizziness, palpitations, syncope.   GI  No heartburn, indigestion, abdominal pain, nausea, vomiting, diarrhea, change in bowel habits, loss of appetite, bloody stools.   Resp:   No chest wall deformity  Skin: no rash or lesions.  GU: no dysuria, change in color of urine, no urgency or frequency.  No flank pain, no hematuria   MS:  No joint pain or swelling.  No decreased range of motion.  No back pain.    Physical Exam  BP 120/74 (BP Location: Left Arm, Patient Position: Sitting)   Pulse 70   Temp 97.9 F (36.6 C) (Oral)   Ht '5\' 5"'$  (1.651 m)   Wt 262 lb (118.8 kg)   SpO2 99%   BMI 43.60 kg/m   GEN: A/Ox3; pleasant , NAD, well nourished    HEENT:  [place]/AT,   NOSE-clear, THROAT-clear, no lesions, no postnasal drip or exudate noted.   NECK:  Supple w/ fair ROM; no JVD; normal carotid impulses w/o bruits; no thyromegaly or nodules palpated; no lymphadenopathy.    RESP  Clear  P & A; w/o, wheezes/ rales/ or rhonchi. no accessory muscle use, no dullness to percussion  CARD:  RRR, no m/r/g, no peripheral edema, pulses intact, no cyanosis or clubbing.  GI:   Soft & nt; nml bowel sounds; no organomegaly or masses  detected.   Musco: Warm bil, no deformities or joint swelling noted.   Neuro: alert, no focal deficits noted.    Skin: Warm, no lesions or rashes    Lab Results:  CBC    Component Value Date/Time   WBC 7.6 10/23/2022 1548   RBC 4.87 10/23/2022 1548   HGB 13.9 10/23/2022 1548   HCT 41.2 10/23/2022 1548   PLT 356 10/23/2022 1548   MCV 84.6 10/23/2022 1548   MCH 28.5 10/23/2022 1548   MCHC 33.7 10/23/2022 1548   RDW 15.3 10/23/2022 1548   LYMPHSABS 2.7 10/23/2022 1548   MONOABS 0.8 10/23/2022 1548   EOSABS 0.1 10/23/2022 1548   BASOSABS 0.1 10/23/2022 1548    BMET    Component Value Date/Time   NA 142 11/07/2022 1020   K 3.3 (L)  11/07/2022 1020   CL 104 11/07/2022 1020   CO2 31 11/07/2022 1020   GLUCOSE 109 (H) 11/07/2022 1020   BUN 11 11/07/2022 1020   CREATININE 0.79 11/07/2022 1020   CREATININE 0.78 01/08/2016 1540   CALCIUM 9.2 11/07/2022 1020   GFRNONAA >60 10/23/2022 1548   GFRAA >60 05/17/2020 1615    BNP No results found for: "BNP"  ProBNP    Component Value Date/Time   PROBNP 34.0 11/07/2022 1020    Imaging: DG Chest 2 View  Result Date: 11/07/2022 CLINICAL DATA:  Sarcoidosis EXAM: CHEST - 2 VIEW COMPARISON:  06/24/2021 FINDINGS: The heart size and mediastinal contours are within normal limits. Both lungs are clear. The visualized skeletal structures are unremarkable. IMPRESSION: No active cardiopulmonary disease. Electronically Signed   By: Rolm Baptise M.D.   On: 11/07/2022 10:42         Latest Ref Rng & Units 12/05/2022   12:58 PM 08/27/2019    9:09 AM 05/12/2015   11:52 AM  PFT Results  FVC-Pre L 2.38  P 2.32  P 2.73   FVC-Predicted Pre % 63  P 73  P 84   FVC-Post L 2.29  P 2.26  P 2.60   FVC-Predicted Post % 60  P 71  P 80   Pre FEV1/FVC % % 90  P 84  P 87   Post FEV1/FCV % % 90  P 88  P 88   FEV1-Pre L 2.14  P 1.95  P 2.36   FEV1-Predicted Pre % 71  P 76  P 89   FEV1-Post L 2.07  P 2.00  P 2.30   DLCO uncorrected ml/min/mmHg 20.55   P 20.24  P 22.68   DLCO UNC% % 91  P 89  P 84   DLCO corrected ml/min/mmHg 20.25  P    DLCO COR %Predicted % 90  P    DLVA Predicted % 125  P 145  P 125   TLC L 4.04  P 3.50  P 3.80   TLC % Predicted % 75  P 65  P 71   RV % Predicted % 79  P 61  P 62     P Preliminary result    No results found for: "NITRICOXIDE"      Assessment & Plan:   Sarcoidosis Suspected recent sarcoid flare.  Now seems to be improved with prednisone.  PFTs appear stable.  Will hold off on CT chest for now.  Recent chest x-ray was clear.  Encourage patient continue with yearly ophthalmology follow-up.  Plan  Patient Instructions  Follow up with Ophthalmology for sarcoid.  Activity as tolerated.  Work on healthy weight.  Zyrtec '10mg'$  At bedtime  As needed  for drainage  Delsym 2 tsp Twice daily  As needed  for cough .  Saline nasal gel At bedtime   Albuterol inhaler As needed  wheezing /cough or shortness of breath .  Follow up with Dr. Elsworth Soho or Jahzara Slattery NP in 4-6 months and As needed       Hypokalemia Lab work showed improved potassium.  Continue follow-up with primary care.  Class 3 obesity due to excess calories without serious comorbidity with body mass index (BMI) of 40.0 to 44.9 in adult [facility]) Encouraged on healthy weight loss     Rexene Edison, NP 12/05/2022

## 2022-12-05 NOTE — Progress Notes (Signed)
PFT done today. 

## 2022-12-05 NOTE — Assessment & Plan Note (Signed)
Encouraged on healthy weight loss 

## 2022-12-05 NOTE — Assessment & Plan Note (Signed)
Lab work showed improved potassium.  Continue follow-up with primary care.

## 2023-01-17 ENCOUNTER — Telehealth: Payer: Self-pay | Admitting: Pulmonary Disease

## 2023-01-17 MED ORDER — PREDNISONE 10 MG PO TABS: ORAL_TABLET | ORAL | 0 refills | Status: AC

## 2023-01-17 NOTE — Telephone Encounter (Signed)
Called Pt back to let her know that Dr Elsworth Soho has sent her in a Prednisone taper to her pharmacy. Pt stated understanding and nothing further needed.

## 2023-03-07 ENCOUNTER — Encounter (HOSPITAL_BASED_OUTPATIENT_CLINIC_OR_DEPARTMENT_OTHER): Payer: Self-pay

## 2023-03-07 ENCOUNTER — Other Ambulatory Visit (HOSPITAL_BASED_OUTPATIENT_CLINIC_OR_DEPARTMENT_OTHER): Payer: Self-pay

## 2023-03-07 MED ORDER — WEGOVY 0.25 MG/0.5ML ~~LOC~~ SOAJ
0.2500 mg | SUBCUTANEOUS | 0 refills | Status: DC
  Filled 2023-03-07 – 2023-07-04 (×2): qty 2, 28d supply, fill #0

## 2023-04-10 ENCOUNTER — Encounter: Payer: Self-pay | Admitting: *Deleted

## 2023-05-25 ENCOUNTER — Emergency Department (HOSPITAL_BASED_OUTPATIENT_CLINIC_OR_DEPARTMENT_OTHER)
Admission: EM | Admit: 2023-05-25 | Discharge: 2023-05-25 | Disposition: A | Discharge status: Home / Self Care | Payer: Self-pay | Attending: Emergency Medicine | Admitting: Emergency Medicine

## 2023-05-25 ENCOUNTER — Emergency Department (HOSPITAL_BASED_OUTPATIENT_CLINIC_OR_DEPARTMENT_OTHER): Payer: BC Managed Care – PPO

## 2023-05-25 ENCOUNTER — Encounter (HOSPITAL_BASED_OUTPATIENT_CLINIC_OR_DEPARTMENT_OTHER): Payer: Self-pay

## 2023-05-25 ENCOUNTER — Other Ambulatory Visit: Payer: Self-pay

## 2023-05-25 DIAGNOSIS — M79662 Pain in left lower leg: Secondary | ICD-10-CM | POA: Insufficient documentation

## 2023-05-25 LAB — BASIC METABOLIC PANEL
Anion gap: 7 (ref 5–15)
BUN: 11 mg/dL (ref 6–20)
CO2: 29 mmol/L (ref 22–32)
Calcium: 9.1 mg/dL (ref 8.9–10.3)
Chloride: 101 mmol/L (ref 98–111)
Creatinine, Ser: 0.81 mg/dL (ref 0.44–1.00)
GFR, Estimated: >60 mL/min (ref >60)
Glucose, Bld: 97 mg/dL (ref 70–99)
Potassium: 3.7 mmol/L (ref 3.5–5.1)
Sodium: 137 mmol/L (ref 135–145)

## 2023-05-25 LAB — CBC
HCT: 38.2 % (ref 36.0–46.0)
Hemoglobin: 12.6 g/dL (ref 12.0–15.0)
MCH: 27.9 pg (ref 26.0–34.0)
MCHC: 33 g/dL (ref 30.0–36.0)
MCV: 84.7 fL (ref 80.0–100.0)
Platelets: 304 10*3/uL (ref 150–400)
RBC: 4.51 MIL/uL (ref 3.87–5.11)
RDW: 15.5 % (ref 11.5–15.5)
WBC: 6.3 10*3/uL (ref 4.0–10.5)
nRBC: 0 % (ref 0.0–0.2)

## 2023-05-25 LAB — MAGNESIUM: Magnesium: 2 mg/dL (ref 1.7–2.4)

## 2023-05-25 NOTE — ED Notes (Signed)
D/c paperwork reviewed with pt, including follow up care.  No questions or concerns voiced at time of d/c. . Pt verbalized understanding, Ambulatory without assistance to ED exit, NAD.   

## 2023-05-25 NOTE — Discharge Instructions (Signed)
You were seen for your calf pain in the emergency department.  Your electrolytes were normal and your ultrasound was normal.  At home, please use Tylenol and ibuprofen for your pain.    Follow-up with your primary doctor in 2-3 days regarding your visit.    Return immediately to the emergency department if you experience any of the following: Severe pain, or any other concerning symptoms.    Thank you for visiting our Emergency Department. It was a pleasure taking care of you today.

## 2023-05-25 NOTE — ED Provider Notes (Signed)
[place] [facility]ERGENC[facility] DEPARTMENT AT [facility] Provider Note   CSN: [id number] Arrival date & time: 05/25/23  2114     History  Chief Complaint  Patient presents with   Numbness    Elizabeth Riggs is a 52 y.o. female.  52 year old female with a history of osteoarthritis, sarcoidosis, prediabetes, and obesity on Wegovy who presents the emergency department with left lower extremity pain.  Patient reports that she was started on Wegovy 2 weeks ago and since then has been feeling nauseous and having lots of diarrhea.  Says that she has had cramping of her left calf and pain in her left calf recently.  Has a history of low potassium and is on potassium supplementation so is worried about her electrolytes especially with the diarrhea.  No history of DVT or PE.  Not on blood thinners.  No history of knee surgeries or recent surgeries otherwise.  No back pain.       Home Medications Prior to Admission medications   Medication Sig Start Date End Date Taking? Authorizing Provider  albuterol (VENTOLIN HFA) 108 (90 Base) MCG/ACT inhaler Inhale 1-2 puffs into the lungs every 6 (six) hours as needed. 12/05/22   Parrett, Virgel Bouquet, NP  Diclofenac Sodium (PENNSAID) 2 % SOLN Place 1 application onto the skin 2 (two) times daily. 10/22/21   Myra Rude, MD  ibuprofen (ADVIL,MOTRIN) 400 MG tablet Take 400 mg by mouth every 6 (six) hours as needed for moderate pain.    [provider]  levothyroxine (SYNTHROID, LEVOTHROID) 125 MCG tablet Take 1 tablet (125 mcg total) by mouth daily. 03/23/17   Parrett, Virgel Bouquet, NP  omeprazole (PRILOSEC) 20 MG capsule Take 1 capsule (20 mg total) by mouth daily. 03/09/17   Rolan Bucco, MD  potassium chloride SA (KLOR-CON M) 20 MEQ tablet Take 2 tablets (40 mEq total) by mouth daily for 7 days. 10/23/22 10/30/22  Curatolo, Adam, DO  predniSONE (DELTASONE) 10 MG tablet 2 tabs for 5 days, then 1 tab for 5 days, then stop 11/18/22   Omar Person, MD  Semaglutide-Weight Management (WEGOVY) 0.25 MG/0.5ML SOAJ Inject 0.25 mg into the skin once a week. Administer weeks 1 through 4 of therapy 03/07/23     spironolactone (ALDACTONE) 25 MG tablet Take 25 mg by mouth daily. 11/01/22   [provider]      Allergies    Morphine and codeine and Lisinopril    Review of Systems   Review of Systems  Physical Exam Updated Vital Signs BP (!) 114/99 (BP Location: Left Arm)   Pulse 85   Temp 98.6 F (37 C) (Oral)   Resp 16   Ht 5\' 5"  (1.651 m)   Wt 113.4 kg   SpO2 97%   BMI 41.60 kg/m  Physical Exam Vitals and nursing note reviewed.  Constitutional:      General: She is not in acute distress.    Appearance: She is well-developed.  HENT:     Head: Normocephalic and atraumatic.     Right Ear: External ear normal.     Left Ear: External ear normal.     Nose: Nose normal.  Eyes:     Extraocular Movements: Extraocular movements intact.     Conjunctiva/sclera: Conjunctivae normal.     Pupils: Pupils are equal, round, and reactive to light.  Cardiovascular:     Rate and Rhythm: Normal rate and regular rhythm.  Pulmonary:     Effort: Pulmonary effort is normal.  No respiratory distress.  Abdominal:     General: Abdomen is flat.     Palpations: Abdomen is soft.  Musculoskeletal:     Cervical back: Normal range of motion and neck supple.     Right lower leg: No edema.     Left lower leg: No edema.     Comments: Full range of motion of left knee.  Tenderness to palpation of left calf.  No left knee effusion or warmth.  No significant edema on either lower extremity.  Skin:    General: Skin is warm and dry.  Neurological:     Mental Status: She is alert and oriented to person, place, and time. Mental status is at baseline.  Psychiatric:        Mood and Affect: Mood normal.     ED Results / Procedures / Treatments   Labs (all labs ordered are listed, but only abnormal results are displayed) Labs Reviewed  BASIC  METABOLIC PANEL  MAGNESIUM  CBC    EKG None  Radiology US Venous Img Lower Unilateral Left  Result Date: 05/25/2023 CLINICAL DATA:  Left calf pain. EXAM: Left LOWER EXTREMITY VENOUS DOPPLER ULTRASOUND TECHNIQUE: Gray-scale sonography with compression, as well as color and duplex ultrasound, were performed to evaluate the deep venous system(s) from the level of the common femoral vein through the popliteal and proximal calf veins. COMPARISON:  None Available. FINDINGS: VENOUS Normal compressibility of the common femoral, superficial femoral, and popliteal veins, as well as the visualized calf veins. Visualized portions of profunda femoral vein and great saphenous vein unremarkable. No filling defects to suggest DVT on grayscale or color Doppler imaging. Doppler waveforms show normal direction of venous flow, normal respiratory plasticity and response to augmentation. Limited views of the contralateral common femoral vein are unremarkable. OTHER None. Limitations: none IMPRESSION: Negative for DVT in the left lower extremity. Electronically Signed   By: Marin Roberts M.D.   On: 05/25/2023 22:57    Procedures Procedures    Medications Ordered in ED Medications - No data to display  ED Course/ Medical Decision Making/ A&P                             Medical Decision Making Amount and/or Complexity of Data Reviewed Labs: ordered.   Elizabeth Riggs is a 52 y.o. female with comorbidities that complicate the patient evaluation including sarcoidosis, prediabetes, and obesity on Wegovy who presents emergency department with left calf cramping and pain  Initial Ddx:  Electrolyte abnormality, DVT, trauma, arthritis  MDM:  Feel the patient may potentially have an electrolyte abnormality with her ongoing diarrhea from her [facility] which could be causing cramps.  Will obtain electrolytes at this time.  Will also obtain an ultrasound to evaluate for DVT.  No known trauma to the area that  would cause a muscle strain.  Does have arthritis in that leg but appears to be somewhat different than her usual arthritic pains in her knee.  Plan:  Labs Lower extremity ultrasound  ED Summary/Re-evaluation:  No electrolyte abnormalities were found.  Ultrasound did not show evidence of DVT.  Unclear what is causing the patient's symptoms or could be dehydrated with her diarrhea from her [facility].  Counseled her to talk to her primary doctor about this medication and to stay well-hydrated and drink electrolyte containing beverages like Gatorade.  This patient presents to the ED for concern of complaints listed in HPI, this involves an  extensive number of treatment options, and is a complaint that carries with it a high risk of complications and morbidity. Disposition including potential need for admission considered.   Dispo: DC Home. Return precautions discussed including, but not limited to, those listed in the AVS. Allowed pt time to ask questions which were answered fully prior to dc.  Records reviewed Outpatient Clinic Notes The following labs were independently interpreted: Chemistry and show no acute abnormality I personally reviewed and interpreted cardiac monitoring: normal sinus rhythm  I have reviewed the patients home medications and made adjustments as needed       Final Clinical Impression(s) / ED Diagnoses Final diagnoses:  Pain of left calf    Rx / DC Orders ED Discharge Orders     None         Rondel Baton, MD 05/25/23 [phone]

## 2023-05-25 NOTE — ED Triage Notes (Signed)
Pt states that she feels intermittent tightness/numbness in L knee since 9pm yesterday and thinks that her potassium is low.

## 2023-06-12 ENCOUNTER — Other Ambulatory Visit (HOSPITAL_BASED_OUTPATIENT_CLINIC_OR_DEPARTMENT_OTHER): Payer: Self-pay

## 2023-06-12 MED ORDER — WEGOVY 0.5 MG/0.5ML ~~LOC~~ SOAJ
0.5000 mg | SUBCUTANEOUS | 0 refills | Status: DC
  Filled 2023-06-12 – 2024-01-23 (×5): qty 2, 28d supply, fill #0

## 2023-06-13 ENCOUNTER — Other Ambulatory Visit (HOSPITAL_BASED_OUTPATIENT_CLINIC_OR_DEPARTMENT_OTHER): Payer: Self-pay

## 2023-06-14 ENCOUNTER — Other Ambulatory Visit (HOSPITAL_BASED_OUTPATIENT_CLINIC_OR_DEPARTMENT_OTHER): Payer: Self-pay

## 2023-06-19 ENCOUNTER — Other Ambulatory Visit (HOSPITAL_BASED_OUTPATIENT_CLINIC_OR_DEPARTMENT_OTHER): Payer: Self-pay

## 2023-06-20 ENCOUNTER — Other Ambulatory Visit (HOSPITAL_BASED_OUTPATIENT_CLINIC_OR_DEPARTMENT_OTHER): Payer: Self-pay

## 2023-06-22 ENCOUNTER — Other Ambulatory Visit: Payer: Self-pay

## 2023-06-23 ENCOUNTER — Other Ambulatory Visit (HOSPITAL_BASED_OUTPATIENT_CLINIC_OR_DEPARTMENT_OTHER): Payer: Self-pay

## 2023-06-26 ENCOUNTER — Other Ambulatory Visit (HOSPITAL_BASED_OUTPATIENT_CLINIC_OR_DEPARTMENT_OTHER): Payer: Self-pay

## 2023-07-03 ENCOUNTER — Other Ambulatory Visit (HOSPITAL_BASED_OUTPATIENT_CLINIC_OR_DEPARTMENT_OTHER): Payer: Self-pay

## 2023-07-04 ENCOUNTER — Other Ambulatory Visit (HOSPITAL_BASED_OUTPATIENT_CLINIC_OR_DEPARTMENT_OTHER): Payer: Self-pay

## 2023-07-05 ENCOUNTER — Other Ambulatory Visit (HOSPITAL_BASED_OUTPATIENT_CLINIC_OR_DEPARTMENT_OTHER): Payer: Self-pay

## 2023-07-17 ENCOUNTER — Emergency Department (HOSPITAL_BASED_OUTPATIENT_CLINIC_OR_DEPARTMENT_OTHER)
Admission: EM | Admit: 2023-07-17 | Discharge: 2023-07-17 | Discharge status: Against Medical Advice | Payer: BC Managed Care – PPO | Attending: Emergency Medicine | Admitting: Emergency Medicine

## 2023-07-17 ENCOUNTER — Other Ambulatory Visit: Payer: Self-pay

## 2023-07-17 DIAGNOSIS — Z5321 Procedure and treatment not carried out due to patient leaving prior to being seen by health care provider: Secondary | ICD-10-CM | POA: Diagnosis not present

## 2023-07-17 DIAGNOSIS — M546 Pain in thoracic spine: Secondary | ICD-10-CM | POA: Insufficient documentation

## 2023-07-17 DIAGNOSIS — R0781 Pleurodynia: Secondary | ICD-10-CM | POA: Insufficient documentation

## 2023-07-17 NOTE — ED Triage Notes (Signed)
Patient presents to ED via POV from home. Here here right middle back pain/rib cage pain that began on Saturday. Denies injury/trauma. Denies nausea, vomiting, diarrhea or dysuria.

## 2023-07-17 NOTE — ED Notes (Signed)
No answer in lobby x1

## 2023-07-26 ENCOUNTER — Other Ambulatory Visit (HOSPITAL_BASED_OUTPATIENT_CLINIC_OR_DEPARTMENT_OTHER): Payer: Self-pay

## 2023-07-26 MED ORDER — PHENTERMINE HCL 37.5 MG PO TABS
37.5000 mg | ORAL_TABLET | Freq: Every morning | ORAL | 2 refills | Status: DC
  Filled 2023-07-26: qty 30, 30d supply, fill #0

## 2023-07-26 MED ORDER — WEGOVY 0.5 MG/0.5ML ~~LOC~~ SOAJ
0.5000 mg | SUBCUTANEOUS | 0 refills | Status: DC
  Filled 2023-07-26: qty 2, 28d supply, fill #0

## 2023-09-05 ENCOUNTER — Other Ambulatory Visit (HOSPITAL_BASED_OUTPATIENT_CLINIC_OR_DEPARTMENT_OTHER): Payer: Self-pay

## 2023-09-06 ENCOUNTER — Other Ambulatory Visit (HOSPITAL_BASED_OUTPATIENT_CLINIC_OR_DEPARTMENT_OTHER): Payer: Self-pay

## 2023-09-07 ENCOUNTER — Other Ambulatory Visit (HOSPITAL_BASED_OUTPATIENT_CLINIC_OR_DEPARTMENT_OTHER): Payer: Self-pay

## 2023-09-08 ENCOUNTER — Other Ambulatory Visit (HOSPITAL_BASED_OUTPATIENT_CLINIC_OR_DEPARTMENT_OTHER): Payer: Self-pay

## 2023-09-11 ENCOUNTER — Other Ambulatory Visit (HOSPITAL_BASED_OUTPATIENT_CLINIC_OR_DEPARTMENT_OTHER): Payer: Self-pay

## 2023-09-12 ENCOUNTER — Other Ambulatory Visit (HOSPITAL_BASED_OUTPATIENT_CLINIC_OR_DEPARTMENT_OTHER): Payer: Self-pay

## 2023-09-13 ENCOUNTER — Other Ambulatory Visit (HOSPITAL_BASED_OUTPATIENT_CLINIC_OR_DEPARTMENT_OTHER): Payer: Self-pay

## 2023-09-14 ENCOUNTER — Other Ambulatory Visit (HOSPITAL_BASED_OUTPATIENT_CLINIC_OR_DEPARTMENT_OTHER): Payer: Self-pay

## 2023-09-18 ENCOUNTER — Other Ambulatory Visit (HOSPITAL_BASED_OUTPATIENT_CLINIC_OR_DEPARTMENT_OTHER): Payer: Self-pay

## 2023-09-22 ENCOUNTER — Other Ambulatory Visit (INDEPENDENT_AMBULATORY_CARE_PROVIDER_SITE_OTHER): Payer: BC Managed Care – PPO

## 2023-09-22 ENCOUNTER — Encounter: Payer: Self-pay | Admitting: Primary Care

## 2023-09-22 ENCOUNTER — Ambulatory Visit (INDEPENDENT_AMBULATORY_CARE_PROVIDER_SITE_OTHER)
Admission: RE | Admit: 2023-09-22 | Discharge: 2023-09-22 | Disposition: A | Discharge status: Home / Self Care | Payer: BC Managed Care – PPO | Source: Ambulatory Visit | Attending: Primary Care | Admitting: Primary Care

## 2023-09-22 ENCOUNTER — Ambulatory Visit (INDEPENDENT_AMBULATORY_CARE_PROVIDER_SITE_OTHER): Payer: BC Managed Care – PPO | Admitting: Primary Care

## 2023-09-22 ENCOUNTER — Ambulatory Visit: Payer: BC Managed Care – PPO

## 2023-09-22 VITALS — BP 120/78 | HR 77 | Wt 256.0 lb

## 2023-09-22 DIAGNOSIS — D869 Sarcoidosis, unspecified: Secondary | ICD-10-CM | POA: Diagnosis not present

## 2023-09-22 DIAGNOSIS — E876 Hypokalemia: Secondary | ICD-10-CM | POA: Diagnosis not present

## 2023-09-22 LAB — CBC WITH DIFFERENTIAL/PLATELET
Basophils Absolute: 0.1 10*3/uL (ref 0.0–0.1)
Basophils Relative: 0.9 % (ref 0.0–3.0)
Eosinophils Absolute: 0.2 10*3/uL (ref 0.0–0.7)
Eosinophils Relative: 3.1 % (ref 0.0–5.0)
HCT: 37.2 % (ref 36.0–46.0)
Hemoglobin: 12.3 g/dL (ref 12.0–15.0)
Lymphocytes Relative: 33.8 % (ref 12.0–46.0)
Lymphs Abs: 2.3 10*3/uL (ref 0.7–4.0)
MCHC: 33 g/dL (ref 30.0–36.0)
MCV: 86.8 fL (ref 78.0–100.0)
Monocytes Absolute: 0.8 10*3/uL (ref 0.1–1.0)
Monocytes Relative: 11.3 % (ref 3.0–12.0)
Neutro Abs: 3.5 10*3/uL (ref 1.4–7.7)
Neutrophils Relative %: 50.9 % (ref 43.0–77.0)
Platelets: 283 10*3/uL (ref 150.0–400.0)
RBC: 4.29 Mil/uL (ref 3.87–5.11)
RDW: 15.7 % — ABNORMAL HIGH (ref 11.5–15.5)
WBC: 6.8 10*3/uL (ref 4.0–10.5)

## 2023-09-22 LAB — BASIC METABOLIC PANEL
BUN: 11 mg/dL (ref 6–23)
CO2: 31 meq/L (ref 19–32)
Calcium: 9.6 mg/dL (ref 8.4–10.5)
Chloride: 103 meq/L (ref 96–112)
Creatinine, Ser: 0.77 mg/dL (ref 0.40–1.20)
GFR: 88.65 mL/min (ref >60.00)
Glucose, Bld: 95 mg/dL (ref 70–99)
Potassium: 3.3 meq/L — ABNORMAL LOW (ref 3.5–5.1)
Sodium: 143 meq/L (ref 135–145)

## 2023-09-22 NOTE — Progress Notes (Unsigned)
@Patient  ID: Elizabeth Riggs, female    DOB: February 11, 1971, 52 y.o.   MRN: [id number]  No chief complaint on file.   Referring provider: Drema Halon, FNP  HPI: 52 years old female, never smoked. PMH significant HTN, GERD, hypothyroidism, sarcoidosis.    09/22/2023 Presents today for acute visit.  She is followed by our office for history of sarcoidosis. Complains of body aches/pain and muscle "knots" in her arms. This is not typical presentation for her sarcoid. She has no associated respiratory symptoms. She denies shortness of breath and cough. Pain in her right arm along with left sided rib pain started this week. Today she feels alittle better than she did on Wednesday 9/25. 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 She also endorses from anxiety due to upcoming knee surgery in early October. Her potassium level has been low, she has been taking 1-2 tablets a day KCL. BP medication has been held.    Allergies  Allergen Reactions   Morphine And Codeine Itching   Lisinopril Other (See Comments) and Rash    headache    Immunization History  Administered Date(s) Administered   Influenza Split 12/23/2016, 12/26/2017, 07/26/2018   Influenza,inj,Quad PF,6+ Mos 08/26/2020   Influenza-Unspecified 12/23/2016, 12/26/2017, 07/26/2018   Moderna Sars-Covid-2 Vaccination 01/07/2020   PFIZER Comirnaty(Gray Top)Covid-19 Tri-Sucrose Vaccine 01/28/2020, 02/18/2020   PFIZER(Purple Top)SARS-COV-2 Vaccination 01/28/2020, 02/18/2020    Past Medical History:  Diagnosis Date   Chronic pain of both knees    Gallstones    GERD (gastroesophageal reflux disease)    Hypertension    Obesity    Sarcoidosis    Thyroid disease     Tobacco History: Social History   Tobacco Use  Smoking Status Never  Smokeless Tobacco Never   Counseling given: Not Answered   Outpatient Medications Prior to Visit   Medication Sig Dispense Refill   albuterol (VENTOLIN HFA) 108 (90 Base) MCG/ACT inhaler Inhale 1-2 puffs into the lungs every 6 (six) hours as needed. 8 g 2   Diclofenac Sodium (PENNSAID) 2 % SOLN Place 1 application onto the skin 2 (two) times daily. 112 g 2   ibuprofen (ADVIL,MOTRIN) 400 MG tablet Take 400 mg by mouth every 6 (six) hours as needed for moderate pain.     levothyroxine (SYNTHROID, LEVOTHROID) 125 MCG tablet Take 1 tablet (125 mcg total) by mouth daily. 30 tablet 0   omeprazole (PRILOSEC) 20 MG capsule Take 1 capsule (20 mg total) by mouth daily. 30 capsule 0   phentermine (ADIPEX-P) 37.5 MG tablet Take 1 tablet (37.5 mg total) by mouth every morning 30 minutes before or 1-2 hours after breakfast 30 tablet 2   predniSONE (DELTASONE) 10 MG tablet 2 tabs for 5 days, then 1 tab for 5 days, then stop 15 tablet 0   Semaglutide-Weight Management (WEGOVY) 0.25 MG/0.5ML SOAJ Inject 0.25 mg into the skin once a week. Administer weeks 1 through 4 of therapy 2 mL 0   Semaglutide-Weight Management (WEGOVY) 0.5 MG/0.5ML SOAJ Inject 0.5 mg into the skin once a week. 2 mL 0   Semaglutide-Weight Management (WEGOVY) 0.5 MG/0.5ML SOAJ Inject 0.5 mg into the skin once a week. 2 mL 0   spironolactone (ALDACTONE) 25 MG tablet Take 25 mg by mouth daily.     potassium chloride SA (KLOR-CON M) 20 MEQ tablet Take 2 tablets (40 mEq total) by mouth daily for 7 days.  14 tablet 0   No facility-administered medications prior to visit.   Review of Systems  Review of Systems  Constitutional: Negative.   Respiratory: Negative.  Negative for cough, shortness of breath and wheezing.   Musculoskeletal:  Positive for arthralgias.    Physical Exam  BP 120/78   Pulse 77   Wt 256 lb (116.1 kg)   SpO2 94%   BMI 42.60 kg/m  Physical Exam Constitutional:      Appearance: Normal appearance. She is obese.  HENT:     Head: Normocephalic and atraumatic.  Cardiovascular:     Rate and Rhythm: Normal rate and  regular rhythm.  Pulmonary:     Effort: Pulmonary effort is normal.     Breath sounds: Normal breath sounds. No wheezing, rhonchi or rales.  Skin:    General: Skin is warm and dry.  Neurological:     General: No focal deficit present.     Mental Status: She is alert and oriented to person, place, and time. Mental status is at baseline.  Psychiatric:        Mood and Affect: Mood normal.        Behavior: Behavior normal.        Thought Content: Thought content normal.        Judgment: Judgment normal.      Lab Results:  CBC    Component Value Date/Time   WBC 6.8 09/22/2023 1021   RBC 4.29 09/22/2023 1021   HGB 12.3 09/22/2023 1021   HCT 37.2 09/22/2023 1021   PLT 283.0 09/22/2023 1021   MCV 86.8 09/22/2023 1021   MCH 27.9 05/25/2023 2223   MCHC 33.0 09/22/2023 1021   RDW 15.7 (H) 09/22/2023 1021   LYMPHSABS 2.3 09/22/2023 1021   MONOABS 0.8 09/22/2023 1021   EOSABS 0.2 09/22/2023 1021   BASOSABS 0.1 09/22/2023 1021    BMET    Component Value Date/Time   NA 143 09/22/2023 1021   K 3.3 (L) 09/22/2023 1021   CL 103 09/22/2023 1021   CO2 31 09/22/2023 1021   GLUCOSE 95 09/22/2023 1021   BUN 11 09/22/2023 1021   CREATININE 0.77 09/22/2023 1021   CREATININE 0.78 01/08/2016 1540   CALCIUM 9.6 09/22/2023 1021   GFRNONAA >60 05/25/2023 2223   GFRAA >60 05/17/2020 1615    BNP No results found for: "BNP"  ProBNP    Component Value Date/Time   PROBNP 34.0 11/07/2022 1020    Imaging: DG Chest 2 View  Result Date: 09/22/2023 CLINICAL DATA:  Sarcoid flare-up. EXAM: CHEST - 2 VIEW COMPARISON:  11/07/2022 FINDINGS: Both lungs are clear. No focal airspace disease. Heart and mediastinum are within normal limits. Trachea is midline. No large pleural effusions. No acute bone abnormality. IMPRESSION: No active cardiopulmonary disease. Electronically Signed   By: Richarda Overlie M.D.   On: 09/22/2023 12:02     Assessment & Plan:   Arthralgia - Possible sarcoid flare.  Complains of muscle aches/pain for last week. No associated respiratory symptoms. Checking ACE level and CXR.   Sarcoidosis - If ACE level >70, advised patient to start 20mg  prednisone until follow-up with [place]   Hypokalemia - Continue potassium supplement as directed - Checking BMET   Glenford Bayley, NP 09/24/2023

## 2023-09-22 NOTE — Patient Instructions (Addendum)
If ACE level is >70 start prednisone 20mg  daily until we follow-up Continue potassium supplement for now as directed   Orders: Labs- ACE level, BMET and CBC (ordered) CXR (ordered)   Follow-up: 2-3 months with Dr. Vassie Loll for regular follow-up/sarcoid   Sarcoidosis  Sarcoidosis is a disease that can cause inflammation in many areas of the body. It most often affects the lungs (pulmonary sarcoidosis). Sarcoidosis can also affect the lymph nodes, liver, eyes, skin, heart, or any other body tissue. Normally, cells that are part of the body's disease-fighting system (immune system) attack harmful substances in the body, such as germs. This immune system response causes inflammation. After the harmful substance is destroyed, the inflammation goes away. When you have sarcoidosis, your immune system causes inflammation even when there are no harmful substances, and the inflammation does not go away. Sarcoidosis also causes cells from your immune system to form small lumps (granulomas) in the affected area of your body. What are the causes? The exact cause of sarcoidosis is not known.  If you have a family history of this disease (genetic predisposition), the immune system response that leads to inflammation may be triggered by something in your environment, such as: Bacteria or viruses. Metals. Chemicals. Dust. Mold or mildew. What increases the risk? You may be more likely to develop this condition if you: Have a family history of the disease. Are African American. Are of Northern European descent. Are 72-42 years old. Are female. Work as a IT sales professional. Work in an environment where you are exposed to metals, chemicals, mold or mildew, or insecticides. What are the signs or symptoms? Some people with sarcoidosis have no symptoms. Others have very mild symptoms. The symptoms usually depend on the organ that is affected. Sarcoidosis most often affects the lungs, which may lead to symptoms such  as: Chest pain. Coughing. Wheezing. Shortness of breath. Other common symptoms include: Night sweats. Fever. Weight loss. Tiredness (fatigue). Swollen lymph nodes. Joint pain. How is this diagnosed? This condition may be diagnosed based on: Your symptoms and medical history. A physical exam. Imaging tests such as: Chest X-ray. CT scan. MRI. PET scan. Lung function tests. These tests evaluate your breathing and check for problems that may be related to sarcoidosis. A procedure to remove a tissue sample for testing (biopsy). You may have a biopsy of lung tissue if that is where you are having symptoms. You may have tests to check for any complications of the condition. These tests may include: Eye exams. MRI of the heart or brain. Echocardiogram. ECG (electrocardiogram). How is this treated? In some cases, sarcoidosis does not require a specific treatment because it causes no symptoms or only mild symptoms. If your symptoms bother you or are severe, you may be prescribed medicines to reduce inflammation or relieve symptoms. These medicines may include: Prednisone. This is a steroid that reduces inflammation related to sarcoidosis. Hydroxychloroquine. This may be used to treat sarcoidosis that affects the skin, eyes, or brain. Certain medicines that affect the immune system. These can help with sarcoidosis in the joints, eyes, skin, or lungs. Medicines that you breathe in (inhalers). Inhalers can help you breathe if sarcoidosis affects your lungs. Follow these instructions at home:  Do not use any products that contain nicotine or tobacco. These products include cigarettes, chewing tobacco, and vaping devices, such as e-cigarettes. If you need help quitting, ask your health care provider. Avoid secondhand smoke and irritating dust or chemicals. Stay indoors on days when air quality is poor in  your area. Return to your normal activities as told by your health care provider. Ask your  health care provider what activities are safe for you. Take or use over-the-counter and prescription medicines only as told by your health care provider. Keep all follow-up visits. This is important. Where to find more information National Heart, Lung, and Blood Institute: [employer] Contact a health care provider if: You have vision problems. You have a dry cough that does not go away. You have an irregular heartbeat. You have pain or aches in your joints, hands, or feet. You have an unexplained rash. Get help right away if: You have chest pain. You have trouble breathing. These symptoms may represent a serious problem that is an emergency. Do not wait to see if the symptoms will go away. Get medical help right away. Call your local emergency services (911 in the [place] Do not drive yourself to the hospital. Summary Sarcoidosis is a disease that can cause inflammation in many body areas of the body. It most often affects the lungs (pulmonary sarcoidosis). It can also affect the lymph nodes, liver, eyes, skin, heart, or any other body tissue. When you have sarcoidosis, cells from your immune system form small lumps (granulomas) in the affected area of your body. Sarcoidosis sometimes does not require a specific treatment because it causes no symptoms or only mild symptoms. If your symptoms bother you or are severe, you may be prescribed medicines to reduce inflammation or relieve symptoms. This information is not intended to replace advice given to you by your health care provider. Make sure you discuss any questions you have with your health care provider. Document Revised: 10/13/2020 Document Reviewed: 10/13/2020 Elsevier Patient Education  2024 [employer].

## 2023-09-24 DIAGNOSIS — M2550 Pain in unspecified joint: Secondary | ICD-10-CM | POA: Insufficient documentation

## 2023-09-24 NOTE — Assessment & Plan Note (Signed)
-   If ACE level >70, advised patient to start 20mg  prednisone until follow-up with [place]

## 2023-09-24 NOTE — Assessment & Plan Note (Signed)
-   Continue potassium supplement as directed - Checking BMET

## 2023-09-24 NOTE — Assessment & Plan Note (Signed)
-   Possible sarcoid flare. Complains of muscle aches/pain for last week. No associated respiratory symptoms. Checking ACE level and CXR.

## 2023-09-25 ENCOUNTER — Other Ambulatory Visit (HOSPITAL_BASED_OUTPATIENT_CLINIC_OR_DEPARTMENT_OTHER): Payer: Self-pay

## 2023-09-25 LAB — ANGIOTENSIN CONVERTING ENZYME: Angiotensin-Converting Enzyme: 68 U/L — ABNORMAL HIGH (ref 9–67)

## 2023-09-26 NOTE — Progress Notes (Signed)
Please let patient know ACE level was just likely elevated, appears at her baseline. She can stop prednisone. I dont think sarcoid is flared, CXR was normal

## 2023-09-29 ENCOUNTER — Other Ambulatory Visit: Payer: Self-pay

## 2023-10-06 ENCOUNTER — Other Ambulatory Visit (HOSPITAL_BASED_OUTPATIENT_CLINIC_OR_DEPARTMENT_OTHER): Payer: Self-pay

## 2023-11-08 ENCOUNTER — Other Ambulatory Visit (HOSPITAL_BASED_OUTPATIENT_CLINIC_OR_DEPARTMENT_OTHER): Payer: Self-pay

## 2023-11-08 MED ORDER — WEGOVY 0.25 MG/0.5ML ~~LOC~~ SOAJ
0.2500 mg | SUBCUTANEOUS | 1 refills | Status: DC
  Filled 2023-11-08 – 2023-12-01 (×6): qty 2, 28d supply, fill #0

## 2023-11-09 ENCOUNTER — Other Ambulatory Visit (HOSPITAL_BASED_OUTPATIENT_CLINIC_OR_DEPARTMENT_OTHER): Payer: Self-pay

## 2023-11-13 ENCOUNTER — Other Ambulatory Visit (HOSPITAL_BASED_OUTPATIENT_CLINIC_OR_DEPARTMENT_OTHER): Payer: Self-pay

## 2023-11-16 ENCOUNTER — Other Ambulatory Visit (HOSPITAL_BASED_OUTPATIENT_CLINIC_OR_DEPARTMENT_OTHER): Payer: Self-pay

## 2023-11-20 ENCOUNTER — Other Ambulatory Visit (HOSPITAL_BASED_OUTPATIENT_CLINIC_OR_DEPARTMENT_OTHER): Payer: Self-pay

## 2023-11-21 ENCOUNTER — Other Ambulatory Visit (HOSPITAL_BASED_OUTPATIENT_CLINIC_OR_DEPARTMENT_OTHER): Payer: Self-pay

## 2023-12-01 ENCOUNTER — Other Ambulatory Visit (HOSPITAL_BASED_OUTPATIENT_CLINIC_OR_DEPARTMENT_OTHER): Payer: Self-pay

## 2023-12-01 ENCOUNTER — Encounter (HOSPITAL_BASED_OUTPATIENT_CLINIC_OR_DEPARTMENT_OTHER): Payer: Self-pay | Admitting: Pharmacist

## 2023-12-04 ENCOUNTER — Other Ambulatory Visit (HOSPITAL_BASED_OUTPATIENT_CLINIC_OR_DEPARTMENT_OTHER): Payer: Self-pay

## 2023-12-05 ENCOUNTER — Other Ambulatory Visit (HOSPITAL_BASED_OUTPATIENT_CLINIC_OR_DEPARTMENT_OTHER): Payer: Self-pay

## 2023-12-07 ENCOUNTER — Other Ambulatory Visit (HOSPITAL_BASED_OUTPATIENT_CLINIC_OR_DEPARTMENT_OTHER): Payer: Self-pay

## 2023-12-08 ENCOUNTER — Ambulatory Visit (HOSPITAL_BASED_OUTPATIENT_CLINIC_OR_DEPARTMENT_OTHER): Payer: BC Managed Care – PPO | Admitting: Pulmonary Disease

## 2023-12-08 ENCOUNTER — Encounter (HOSPITAL_BASED_OUTPATIENT_CLINIC_OR_DEPARTMENT_OTHER): Payer: Self-pay | Admitting: Pulmonary Disease

## 2023-12-08 ENCOUNTER — Other Ambulatory Visit (HOSPITAL_BASED_OUTPATIENT_CLINIC_OR_DEPARTMENT_OTHER): Payer: Self-pay

## 2023-12-11 ENCOUNTER — Other Ambulatory Visit (HOSPITAL_BASED_OUTPATIENT_CLINIC_OR_DEPARTMENT_OTHER): Payer: Self-pay

## 2023-12-12 ENCOUNTER — Other Ambulatory Visit (HOSPITAL_BASED_OUTPATIENT_CLINIC_OR_DEPARTMENT_OTHER): Payer: Self-pay

## 2023-12-14 ENCOUNTER — Other Ambulatory Visit (HOSPITAL_BASED_OUTPATIENT_CLINIC_OR_DEPARTMENT_OTHER): Payer: Self-pay

## 2023-12-15 ENCOUNTER — Other Ambulatory Visit (HOSPITAL_BASED_OUTPATIENT_CLINIC_OR_DEPARTMENT_OTHER): Payer: Self-pay

## 2023-12-18 ENCOUNTER — Other Ambulatory Visit (HOSPITAL_BASED_OUTPATIENT_CLINIC_OR_DEPARTMENT_OTHER): Payer: Self-pay

## 2023-12-19 ENCOUNTER — Other Ambulatory Visit (HOSPITAL_BASED_OUTPATIENT_CLINIC_OR_DEPARTMENT_OTHER): Payer: Self-pay

## 2023-12-21 ENCOUNTER — Other Ambulatory Visit (HOSPITAL_BASED_OUTPATIENT_CLINIC_OR_DEPARTMENT_OTHER): Payer: Self-pay

## 2023-12-22 ENCOUNTER — Other Ambulatory Visit (HOSPITAL_BASED_OUTPATIENT_CLINIC_OR_DEPARTMENT_OTHER): Payer: Self-pay

## 2023-12-25 ENCOUNTER — Other Ambulatory Visit (HOSPITAL_BASED_OUTPATIENT_CLINIC_OR_DEPARTMENT_OTHER): Payer: Self-pay

## 2023-12-26 ENCOUNTER — Other Ambulatory Visit (HOSPITAL_BASED_OUTPATIENT_CLINIC_OR_DEPARTMENT_OTHER): Payer: Self-pay

## 2023-12-28 ENCOUNTER — Other Ambulatory Visit (HOSPITAL_BASED_OUTPATIENT_CLINIC_OR_DEPARTMENT_OTHER): Payer: Self-pay

## 2023-12-29 ENCOUNTER — Other Ambulatory Visit (HOSPITAL_BASED_OUTPATIENT_CLINIC_OR_DEPARTMENT_OTHER): Payer: Self-pay

## 2024-01-01 ENCOUNTER — Other Ambulatory Visit (HOSPITAL_BASED_OUTPATIENT_CLINIC_OR_DEPARTMENT_OTHER): Payer: Self-pay

## 2024-01-02 ENCOUNTER — Other Ambulatory Visit (HOSPITAL_BASED_OUTPATIENT_CLINIC_OR_DEPARTMENT_OTHER): Payer: Self-pay

## 2024-01-03 ENCOUNTER — Other Ambulatory Visit (HOSPITAL_BASED_OUTPATIENT_CLINIC_OR_DEPARTMENT_OTHER): Payer: Self-pay

## 2024-01-04 ENCOUNTER — Other Ambulatory Visit (HOSPITAL_BASED_OUTPATIENT_CLINIC_OR_DEPARTMENT_OTHER): Payer: Self-pay

## 2024-01-05 ENCOUNTER — Other Ambulatory Visit (HOSPITAL_BASED_OUTPATIENT_CLINIC_OR_DEPARTMENT_OTHER): Payer: Self-pay

## 2024-01-08 ENCOUNTER — Other Ambulatory Visit (HOSPITAL_BASED_OUTPATIENT_CLINIC_OR_DEPARTMENT_OTHER): Payer: Self-pay

## 2024-01-23 ENCOUNTER — Other Ambulatory Visit (HOSPITAL_BASED_OUTPATIENT_CLINIC_OR_DEPARTMENT_OTHER): Payer: Self-pay

## 2024-01-24 ENCOUNTER — Other Ambulatory Visit (HOSPITAL_BASED_OUTPATIENT_CLINIC_OR_DEPARTMENT_OTHER): Payer: Self-pay

## 2024-01-25 ENCOUNTER — Other Ambulatory Visit (HOSPITAL_BASED_OUTPATIENT_CLINIC_OR_DEPARTMENT_OTHER): Payer: Self-pay

## 2024-01-26 ENCOUNTER — Other Ambulatory Visit (HOSPITAL_BASED_OUTPATIENT_CLINIC_OR_DEPARTMENT_OTHER): Payer: Self-pay

## 2024-01-27 ENCOUNTER — Other Ambulatory Visit (HOSPITAL_BASED_OUTPATIENT_CLINIC_OR_DEPARTMENT_OTHER): Payer: Self-pay

## 2024-02-22 ENCOUNTER — Encounter: Payer: Self-pay | Admitting: Adult Health

## 2024-02-22 ENCOUNTER — Ambulatory Visit: Payer: BC Managed Care – PPO | Admitting: Adult Health

## 2024-02-22 VITALS — BP 122/72 | HR 84 | Ht 65.0 in | Wt 261.4 lb

## 2024-02-22 DIAGNOSIS — D869 Sarcoidosis, unspecified: Secondary | ICD-10-CM | POA: Diagnosis not present

## 2024-02-22 DIAGNOSIS — J310 Chronic rhinitis: Secondary | ICD-10-CM | POA: Diagnosis not present

## 2024-02-22 DIAGNOSIS — K219 Gastro-esophageal reflux disease without esophagitis: Secondary | ICD-10-CM | POA: Diagnosis not present

## 2024-02-22 MED ORDER — PANTOPRAZOLE SODIUM 40 MG PO TBEC: 40.0000 mg | DELAYED_RELEASE_TABLET | Freq: Every day | ORAL | 3 refills | Status: AC

## 2024-02-22 NOTE — Assessment & Plan Note (Signed)
Continue on Zyrtec daily as needed 

## 2024-02-22 NOTE — Assessment & Plan Note (Signed)
 Sarcoidosis with cutaneous, lung and gallbladder involvement.-Clinically appears to be stable.  Chest x-ray September 2024 clear.  No recent flare in symptoms no recent steroids.  Continue with yearly eye exams.  Check spirometry with [facility] on return visit  Plan  Patient Instructions  Yearly Ophthalmology for sarcoid.  Activity as tolerated.  Work on healthy weight.  Zyrtec 10mg  At bedtime  As needed  for drainage  Delsym 2 tsp Twice daily  As needed  for cough .  Saline nasal gel At bedtime   Begin Protonix 40mg  daily before meal .  GERD diet.  Follow up with GI as planned .  Albuterol inhaler As needed  wheezing /cough or shortness of breath .  Follow up with Dr. Vassie Loll or Elizabeth Rouillard NP in 6 months with Spirometry with [facility] and As needed

## 2024-02-22 NOTE — Patient Instructions (Addendum)
 Yearly Ophthalmology for sarcoid.  Activity as tolerated.  Work on healthy weight.  Zyrtec 10mg  At bedtime  As needed  for drainage  Delsym 2 tsp Twice daily  As needed  for cough .  Saline nasal gel At bedtime   Begin Protonix 40mg  daily before meal .  GERD diet.  Follow up with GI as planned .  Albuterol inhaler As needed  wheezing /cough or shortness of breath .  Follow up with Dr. Vassie Loll or Haytham Maher NP in 6 months with Spirometry with [facility] and As needed

## 2024-02-22 NOTE — Assessment & Plan Note (Signed)
 Recent esophageal dilatation for esophageal stricture and reflux biopsies positive for Barrett's esophagus.  Patient is currently off her PPI.  Recommend beginning Protonix daily.  Recommend follow-up with GI as planned.  GERD diet discussed Results for pathology reviewed and [facility] MyChart back patient brought to office visit today.

## 2024-02-22 NOTE — Progress Notes (Signed)
 @Patient  ID: Elizabeth Riggs, female    DOB: 1971-07-27, 53 y.o.   MRN: [id number]  Chief Complaint  Patient presents with   Follow-up    Referring provider: Drema Halon, FNP  HPI: 53 year old female never smoker followed for sarcoidosis with cutaneous, lung and gallbladder involvement.  Initial presentation was March 2016 with weight loss, nausea and vomiting.  Underwent cholecystectomy with gallbladder lesion noted.  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    Chest x-ray August 2019 mild chronic reticular changes notable in the bases   Spirometry August 2019 FEV1 73%, ratio 84, FVC 70%-no significant change since 2018   92020 FEV1 at 78%, ratio is 88, FVC 71%, DLCO 89%.   July 31, 2019 high-resolution CT chest -no pathologically enlarged mediastinal or hilar adenopathy.  Minimal patchy subpleural reticulation and fine nodularity along the fissures and subpleural lungs bilaterally this is decreased from previous study in 2016, minimal  distortion and minimal traction bronchiectasis lower lung slightly increased from prior  PFTs December 2023 showed moderate restriction with FEV1 at 71%, FVC 63%, DLCO 91%.  02/22/2024 Follow up ; Sarcoid  Presents for 6 months follow up .  Overall patient says she is doing about the same.  She denies any flare of cough or wheezing.  No recent steroid use. Chest xray in 08/2023 was clear.   She denies any rash or skin lesions. Discussed yearly eye exam. Completed last year.   Recently had esophageal dilation for esophageal stricture. Now swallowing better. Positive for Barrett's esophagus. No longer taking Prilosec. Does have reflux intermittently .  Did state during this time.  She was not feeling good and general malaise and bodyaches.  This has improved.  Recent Right TKR in October. Now back to Working fulltime.    Allergies  Allergen Reactions   Morphine And Codeine Itching   Lisinopril Other (See Comments) and Rash    headache    Immunization History  Administered Date(s) Administered   Influenza Split 12/23/2016, 12/26/2017, 07/26/2018   Influenza,inj,Quad PF,6+ Mos 08/26/2020   Influenza-Unspecified 12/23/2016, 12/26/2017, 07/26/2018   Moderna Sars-Covid-2 Vaccination 01/07/2020   PFIZER Comirnaty(Gray Top)Covid-19 Tri-Sucrose Vaccine 01/28/2020, 02/18/2020   PFIZER(Purple Top)SARS-COV-2 Vaccination 01/28/2020, 02/18/2020    Past Medical History:  Diagnosis Date   Chronic pain of both knees    Gallstones    GERD (gastroesophageal reflux disease)    Hypertension    Obesity    Sarcoidosis    Thyroid disease     Tobacco History: Social History   Tobacco Use  Smoking Status Never  Smokeless Tobacco Never   Counseling given: Not Answered   Outpatient Medications Prior to Visit  Medication Sig Dispense Refill   ibuprofen (ADVIL,MOTRIN) 400 MG tablet Take 400 mg by mouth every 6 (six) hours as needed for moderate pain.     Semaglutide-Weight Management (WEGOVY) 0.5 MG/0.5ML SOAJ Inject 0.5 mg into the skin once a week. 2 mL 0  Semaglutide-Weight Management (WEGOVY) 0.5 MG/0.5ML SOAJ Inject 0.5 mg into the skin once a week. 2 mL 0   albuterol (VENTOLIN HFA) 108 (90 Base) MCG/ACT inhaler Inhale 1-2 puffs into the lungs every 6 (six) hours as needed. (Patient not taking: Reported on 02/22/2024) 8 g 2   Diclofenac Sodium (PENNSAID) 2 % SOLN Place 1  application onto the skin 2 (two) times daily. (Patient not taking: Reported on 02/22/2024) 112 g 2   levothyroxine (SYNTHROID, LEVOTHROID) 125 MCG tablet Take 1 tablet (125 mcg total) by mouth daily. (Patient not taking: Reported on 02/22/2024) 30 tablet 0   phentermine (ADIPEX-P) 37.5 MG tablet Take 1 tablet (37.5 mg total) by mouth every morning 30 minutes before or 1-2 hours after breakfast (Patient not taking: Reported on 02/22/2024) 30 tablet 2   potassium chloride SA (KLOR-CON M) 20 MEQ tablet Take 2 tablets (40 mEq total) by mouth daily for 7 days. 14 tablet 0   predniSONE (DELTASONE) 10 MG tablet 2 tabs for 5 days, then 1 tab for 5 days, then stop (Patient not taking: Reported on 02/22/2024) 15 tablet 0   Semaglutide-Weight Management (WEGOVY) 0.25 MG/0.5ML SOAJ Inject 0.25 mg into the skin once a week. Administer weeks 1 through 4 of therapy (Patient not taking: Reported on 02/22/2024) 2 mL 0   Semaglutide-Weight Management (WEGOVY) 0.25 MG/0.5ML SOAJ Inject 0.25 mg into the skin once a week. (Patient not taking: Reported on 02/22/2024) 2 mL 1   spironolactone (ALDACTONE) 25 MG tablet Take 25 mg by mouth daily. (Patient not taking: Reported on 02/22/2024)     omeprazole (PRILOSEC) 20 MG capsule Take 1 capsule (20 mg total) by mouth daily. (Patient not taking: Reported on 02/22/2024) 30 capsule 0   No facility-administered medications prior to visit.     Review of Systems:   Constitutional:   No  weight loss, night sweats,  Fevers, chills,  +fatigue, or  lassitude.  HEENT:   No headaches,  Difficulty swallowing,  Tooth/dental problems, or  Sore throat,                No sneezing, itching, ear ache, nasal congestion, post nasal drip,   CV:  No chest pain,  Orthopnea, PND, swelling in lower extremities, anasarca, dizziness, palpitations, syncope.   GI  No heartburn, indigestion, abdominal pain, nausea, vomiting, diarrhea, change in bowel habits, loss of appetite, bloody stools.   Resp:  No  chest wall deformity  Skin: no rash or lesions.  GU: no dysuria, change in color of urine, no urgency or frequency.  No flank pain, no hematuria   MS:  knee pain -chronic     Physical Exam  BP 122/72   Pulse 84   Ht 5\' 5"  (1.651 m)   Wt 261 lb 6.4 oz (118.6 kg)   SpO2 96%   BMI 43.50 kg/m   GEN: A/Ox3; pleasant , NAD, well nourished    HEENT:  [place]/AT,  NOSE-clear, THROAT-clear, no lesions, no postnasal drip or exudate noted.   NECK:  Supple w/ fair ROM; no JVD; normal carotid impulses w/o bruits; no thyromegaly or nodules palpated; no lymphadenopathy.    RESP  Clear  P & A; w/o, wheezes/ rales/ or rhonchi. no accessory muscle use, no dullness to percussion  CARD:  RRR, no m/r/g, no peripheral edema, pulses intact, no cyanosis or clubbing.  GI:   Soft & nt; nml bowel sounds; no organomegaly or masses detected.   Musco: Warm bil, no deformities or joint swelling  noted.   Neuro: alert, no focal deficits noted.    Skin: Warm, no lesions or rashes    Lab Results:    BNP No results found for: "BNP"  ProBNP   Imaging: No results found.  Administration History     None          Latest Ref Rng & Units 12/05/2022   12:58 PM 08/27/2019    9:09 AM 05/12/2015   11:52 AM  PFT Results  FVC-Pre L 2.38  2.32  P 2.73   FVC-Predicted Pre % 63  73  P 84   FVC-Post L 2.29  2.26  P 2.60   FVC-Predicted Post % 60  71  P 80   Pre FEV1/FVC % % 90  84  P 87   Post FEV1/FCV % % 90  88  P 88   FEV1-Pre L 2.14  1.95  P 2.36   FEV1-Predicted Pre % 71  76  P 89   FEV1-Post L 2.07  2.00  P 2.30   DLCO uncorrected ml/min/mmHg 20.55  20.24  P 22.68   DLCO UNC% % 91  89  P 84   DLCO corrected ml/min/mmHg 20.25     DLCO COR %Predicted % 90     DLVA Predicted % 125  145  P 125   TLC L 4.04  3.50  P 3.80   TLC % Predicted % 75  65  P 71   RV % Predicted % 79  61  P 62     P Preliminary result    No results found for: "NITRICOXIDE"      Assessment & Plan:    Sarcoidosis Sarcoidosis with cutaneous, lung and gallbladder involvement.-Clinically appears to be stable.  Chest x-ray September 2024 clear.  No recent flare in symptoms no recent steroids.  Continue with yearly eye exams.  Check spirometry with DLCO on return visit  Plan  Patient Instructions  Yearly Ophthalmology for sarcoid.  Activity as tolerated.  Work on healthy weight.  Zyrtec 10mg  At bedtime  As needed  for drainage  Delsym 2 tsp Twice daily  As needed  for cough .  Saline nasal gel At bedtime   Begin Protonix 40mg  daily before meal .  GERD diet.  Follow up with GI as planned .  Albuterol inhaler As needed  wheezing /cough or shortness of breath .  Follow up with Dr. Vassie Loll or Dareion Kneece NP in 6 months with Spirometry with DLCO and As needed      GERD (gastroesophageal reflux disease) Recent esophageal dilatation for esophageal stricture and reflux biopsies positive for Barrett's esophagus.  Patient is currently off her PPI.  Recommend beginning Protonix daily.  Recommend follow-up with GI as planned.  GERD diet discussed Results for pathology reviewed and [facility] MyChart back patient brought to office visit today.  Chronic rhinitis Continue on Zyrtec daily as needed.     Rubye Oaks, NP 02/22/2024

## 2024-03-06 ENCOUNTER — Other Ambulatory Visit (HOSPITAL_BASED_OUTPATIENT_CLINIC_OR_DEPARTMENT_OTHER): Payer: Self-pay

## 2024-03-07 ENCOUNTER — Other Ambulatory Visit (HOSPITAL_BASED_OUTPATIENT_CLINIC_OR_DEPARTMENT_OTHER): Payer: Self-pay

## 2024-03-07 MED ORDER — WEGOVY 0.5 MG/0.5ML ~~LOC~~ SOAJ
0.5000 mg | SUBCUTANEOUS | 1 refills | Status: DC
  Filled 2024-03-07: qty 2, 28d supply, fill #0

## 2024-03-08 ENCOUNTER — Other Ambulatory Visit (HOSPITAL_BASED_OUTPATIENT_CLINIC_OR_DEPARTMENT_OTHER): Payer: Self-pay

## 2024-03-08 NOTE — Addendum Note (Signed)
 Addended by: Delrae Rend on: 03/08/2024 10:47 AM   Modules accepted: Orders

## 2024-03-11 ENCOUNTER — Other Ambulatory Visit (HOSPITAL_BASED_OUTPATIENT_CLINIC_OR_DEPARTMENT_OTHER): Payer: Self-pay

## 2024-03-12 ENCOUNTER — Other Ambulatory Visit (HOSPITAL_BASED_OUTPATIENT_CLINIC_OR_DEPARTMENT_OTHER): Payer: Self-pay

## 2024-03-20 ENCOUNTER — Emergency Department (HOSPITAL_BASED_OUTPATIENT_CLINIC_OR_DEPARTMENT_OTHER)

## 2024-03-20 ENCOUNTER — Other Ambulatory Visit: Payer: Self-pay

## 2024-03-20 ENCOUNTER — Emergency Department (HOSPITAL_BASED_OUTPATIENT_CLINIC_OR_DEPARTMENT_OTHER)
Admission: EM | Admit: 2024-03-20 | Discharge: 2024-03-21 | Disposition: A | Discharge status: Home / Self Care | Attending: Emergency Medicine | Admitting: Emergency Medicine

## 2024-03-20 ENCOUNTER — Encounter (HOSPITAL_BASED_OUTPATIENT_CLINIC_OR_DEPARTMENT_OTHER): Payer: Self-pay | Admitting: Emergency Medicine

## 2024-03-20 DIAGNOSIS — X58XXXA Exposure to other specified factors, initial encounter: Secondary | ICD-10-CM | POA: Diagnosis not present

## 2024-03-20 DIAGNOSIS — S8252XS Displaced fracture of medial malleolus of left tibia, sequela: Secondary | ICD-10-CM | POA: Diagnosis not present

## 2024-03-20 DIAGNOSIS — S99912A Unspecified injury of left ankle, initial encounter: Secondary | ICD-10-CM | POA: Diagnosis present

## 2024-03-20 DIAGNOSIS — I10 Essential (primary) hypertension: Secondary | ICD-10-CM | POA: Insufficient documentation

## 2024-03-20 DIAGNOSIS — S93402A Sprain of unspecified ligament of left ankle, initial encounter: Secondary | ICD-10-CM | POA: Insufficient documentation

## 2024-03-20 NOTE — ED Triage Notes (Signed)
 Pt with LT ankle pain and swelling since yesterday; no injury

## 2024-03-21 MED ORDER — KETOROLAC TROMETHAMINE 60 MG/2ML IM SOLN
30.0000 mg | Freq: Once | INTRAMUSCULAR | Status: AC
  Administered 2024-03-21: 30 mg via INTRAMUSCULAR
  Filled 2024-03-21: qty 2

## 2024-03-21 MED ORDER — MELOXICAM 7.5 MG PO TABS: 7.5000 mg | ORAL_TABLET | Freq: Every day | ORAL | 0 refills | Status: AC

## 2024-03-21 NOTE — ED Provider Notes (Signed)
 [place] [facility] Provider Note   CSN: [id number] Arrival date & time: 03/20/24  2249     History  Chief Complaint  Patient presents with   Ankle Pain    Elizabeth Riggs is a 53 y.o. female.  The history is provided by the patient.  Ankle Pain Location:  Ankle Time since incident:  1 day Ankle location:  L ankle Pain details:    Quality:  Aching and pressure   Radiates to:  Does not radiate   Severity:  Severe   Onset quality:  Sudden   Timing:  Constant   Progression:  Unchanged Chronicity:  New Prior injury to area:  Unable to specify Relieved by:  Nothing Worsened by:  Nothing Associated symptoms: no back pain and no fever   Risk factors: no concern for non-accidental trauma       Past Medical History:  Diagnosis Date   Chronic pain of both knees    Gallstones    GERD (gastroesophageal reflux disease)    Hypertension    Obesity    Sarcoidosis    Thyroid disease      Home Medications Prior to Admission medications   Medication Sig Start Date End Date Taking? Authorizing Provider  meloxicam (MOBIC) 7.5 MG tablet Take 1 tablet (7.5 mg total) by mouth daily. 03/21/24  Yes Emmarie Sannes, MD  albuterol (VENTOLIN HFA) 108 (90 Base) MCG/ACT inhaler Inhale 1-2 puffs into the lungs every 6 (six) hours as needed. Patient not taking: Reported on 02/22/2024 12/05/22   Parrett, Virgel Bouquet, NP  Diclofenac Sodium (PENNSAID) 2 % SOLN Place 1 application onto the skin 2 (two) times daily. Patient not taking: Reported on 02/22/2024 10/22/21   Myra Rude, MD  ibuprofen (ADVIL,MOTRIN) 400 MG tablet Take 400 mg by mouth every 6 (six) hours as needed for moderate pain.    [provider]  levothyroxine (SYNTHROID, LEVOTHROID) 125 MCG tablet Take 1 tablet (125 mcg total) by mouth daily. Patient not taking: Reported on 02/22/2024 03/23/17   Parrett, Virgel Bouquet, NP  pantoprazole (PROTONIX) 40 MG tablet Take 1 tablet (40 mg total) by  mouth daily. 02/22/24   Parrett, Virgel Bouquet, NP  phentermine (ADIPEX-P) 37.5 MG tablet Take 1 tablet (37.5 mg total) by mouth every morning 30 minutes before or 1-2 hours after breakfast Patient not taking: Reported on 02/22/2024 07/26/23     potassium chloride SA (KLOR-CON M) 20 MEQ tablet Take 2 tablets (40 mEq total) by mouth daily for 7 days. 10/23/22 10/30/22  Curatolo, Adam, DO  predniSONE (DELTASONE) 10 MG tablet 2 tabs for 5 days, then 1 tab for 5 days, then stop Patient not taking: Reported on 02/22/2024 11/18/22   Omar Person, MD  Semaglutide-Weight Management (WEGOVY) 0.25 MG/0.5ML SOAJ Inject 0.25 mg into the skin once a week. Administer weeks 1 through 4 of therapy Patient not taking: Reported on 02/22/2024 03/07/23     Semaglutide-Weight Management (WEGOVY) 0.5 MG/0.5ML SOAJ Inject 0.5 mg into the skin once a week. 07/26/23     Semaglutide-Weight Management (WEGOVY) 0.5 MG/0.5ML SOAJ Inject 0.5 mg into the skin once a week. 03/07/24     spironolactone (ALDACTONE) 25 MG tablet Take 25 mg by mouth daily. Patient not taking: Reported on 02/22/2024 11/01/22   [provider]      Allergies    Morphine and codeine and Lisinopril    Review of Systems   Review of Systems  Constitutional:  Negative for  fever.  Respiratory:  Negative for wheezing and stridor.   Cardiovascular:  Negative for leg swelling.  Musculoskeletal:  Negative for arthralgias and back pain.  All other systems reviewed and are negative.   Physical Exam Updated Vital Signs BP (!) 141/82 (BP Location: Right Arm)   Pulse 95   Temp 97.8 F (36.6 C)   Resp 18   Ht 5\' 5"  (1.651 m)   Wt 113.4 kg   SpO2 97%   BMI 41.60 kg/m  Physical Exam Vitals and nursing note reviewed.  Constitutional:      General: She is not in acute distress.    Appearance: She is well-developed.  HENT:     Head: Normocephalic and atraumatic.  Eyes:     Pupils: Pupils are equal, round, and reactive to light.  Cardiovascular:      Rate and Rhythm: Normal rate and regular rhythm.     Pulses: Normal pulses.     Heart sounds: Normal heart sounds.  Pulmonary:     Effort: Pulmonary effort is normal. No respiratory distress.     Breath sounds: Normal breath sounds.  Abdominal:     General: Bowel sounds are normal. There is no distension.     Palpations: Abdomen is soft.     Tenderness: There is no abdominal tenderness. There is no guarding or rebound.  Musculoskeletal:        General: Normal range of motion.     Cervical back: Neck supple.     Left lower leg: Normal.     Right ankle: No swelling.     Right Achilles Tendon: Normal.     Left ankle: Swelling present. No ecchymosis. Normal range of motion. Normal pulse.     Left Achilles Tendon: Normal.     Comments: Negative Homan's sign of LLE   Skin:    General: Skin is warm and dry.     Capillary Refill: Capillary refill takes less than 2 seconds.     Findings: No erythema or rash.  Neurological:     General: No focal deficit present.     Deep Tendon Reflexes: Reflexes normal.  Psychiatric:        Mood and Affect: Mood normal.     ED Results / Procedures / Treatments   Labs (all labs ordered are listed, but only abnormal results are displayed) Labs Reviewed - No data to display  EKG None  Radiology DG Ankle Complete Left Result Date: 03/20/2024 CLINICAL DATA:  Left ankle pain for several days, no known injury, initial encounter EXAM: LEFT ANKLE COMPLETE - 3+ VIEW COMPARISON:  None Available. FINDINGS: Generalized soft tissue swelling is noted. Changes of prior trauma with nonunion are noted adjacent to the medial malleolus. No acute fracture or dislocation is noted. Old posterior malleolar fracture is also noted with nonunion. Calcaneal spurring is seen. Tarsal degenerative changes are noted. IMPRESSION: Generalized soft tissue swelling about the ankle. No acute bony abnormality is noted. Changes of prior trauma with nonunion are noted. Electronically  Signed   By: Alcide Clever M.D.   On: 03/20/2024 23:26    Procedures Procedures    Medications Ordered in ED Medications  ketorolac (TORADOL) injection 30 mg (30 mg Intramuscular Given 03/21/24 0009)    ED Course/ Medical Decision Making/ A&P                                 Medical Decision Making Amount and/or  Complexity of Data Reviewed External Data Reviewed: notes.    Details: Previous notes reviewed  Radiology: ordered and independent interpretation performed.    Details: No dislocation by me   Risk Prescription drug management. Risk Details: Pain and swelling are consistent with sprain.  I have informed the patient of need to wear a closed toed or lace up shoe or boot.  I have informed the patient of previous trauma with non union seen on CT scan.  Meloxicam, ace wrap, ice and follow up with orthopedics,  Patient verbalizes understanding of all instructions and agrees to follow up     Final Clinical Impression(s) / ED Diagnoses Final diagnoses:  Sprain of left ankle, unspecified ligament, initial encounter  Closed displaced fracture of medial malleolus of left tibia, sequela   . No signs of systemic illness or infection. The patient is nontoxic-appearing on exam and vital signs are within normal limits.  I have reviewed the triage vital signs and the nursing notes. Pertinent labs & imaging results that were available during my care of the patient were reviewed by me and considered in my medical decision making (see chart for details). After history, exam, and medical workup I feel the patient has been appropriately medically screened and is safe for discharge home. Pertinent diagnoses were discussed with the patient. Patient was given return precautions.    Rx / DC Orders ED Discharge Orders          Ordered    meloxicam (MOBIC) 7.5 MG tablet  Daily        03/21/24 0005              Nury Nebergall, MD 03/21/24 6045

## 2024-07-10 ENCOUNTER — Emergency Department (HOSPITAL_BASED_OUTPATIENT_CLINIC_OR_DEPARTMENT_OTHER)

## 2024-07-10 ENCOUNTER — Other Ambulatory Visit: Payer: Self-pay

## 2024-07-10 ENCOUNTER — Emergency Department (HOSPITAL_BASED_OUTPATIENT_CLINIC_OR_DEPARTMENT_OTHER)
Admission: EM | Admit: 2024-07-10 | Discharge: 2024-07-10 | Disposition: A | Discharge status: Home / Self Care | Attending: Emergency Medicine | Admitting: Emergency Medicine

## 2024-07-10 ENCOUNTER — Encounter (HOSPITAL_BASED_OUTPATIENT_CLINIC_OR_DEPARTMENT_OTHER): Payer: Self-pay | Admitting: Emergency Medicine

## 2024-07-10 DIAGNOSIS — I10 Essential (primary) hypertension: Secondary | ICD-10-CM | POA: Insufficient documentation

## 2024-07-10 DIAGNOSIS — R059 Cough, unspecified: Secondary | ICD-10-CM | POA: Diagnosis present

## 2024-07-10 DIAGNOSIS — E039 Hypothyroidism, unspecified: Secondary | ICD-10-CM | POA: Insufficient documentation

## 2024-07-10 DIAGNOSIS — U071 COVID-19: Secondary | ICD-10-CM | POA: Diagnosis not present

## 2024-07-10 DIAGNOSIS — E876 Hypokalemia: Secondary | ICD-10-CM | POA: Insufficient documentation

## 2024-07-10 LAB — CBC WITH DIFFERENTIAL/PLATELET
Abs Immature Granulocytes: 0.01 K/uL (ref 0.00–0.07)
Basophils Absolute: 0 K/uL (ref 0.0–0.1)
Basophils Relative: 1 %
Eosinophils Absolute: 0.2 K/uL (ref 0.0–0.5)
Eosinophils Relative: 4 %
HCT: 36.4 % (ref 36.0–46.0)
Hemoglobin: 11.9 g/dL — ABNORMAL LOW (ref 12.0–15.0)
Immature Granulocytes: 0 %
Lymphocytes Relative: 33 %
Lymphs Abs: 1.5 K/uL (ref 0.7–4.0)
MCH: 27.4 pg (ref 26.0–34.0)
MCHC: 32.7 g/dL (ref 30.0–36.0)
MCV: 83.7 fL (ref 80.0–100.0)
Monocytes Absolute: 0.6 K/uL (ref 0.1–1.0)
Monocytes Relative: 14 %
Neutro Abs: 2.3 K/uL (ref 1.7–7.7)
Neutrophils Relative %: 48 %
Platelets: 293 K/uL (ref 150–400)
RBC: 4.35 MIL/uL (ref 3.87–5.11)
RDW: 16 % — ABNORMAL HIGH (ref 11.5–15.5)
WBC: 4.7 K/uL (ref 4.0–10.5)
nRBC: 0 % (ref 0.0–0.2)

## 2024-07-10 LAB — COMPREHENSIVE METABOLIC PANEL WITH GFR
ALT: 13 U/L (ref 0–44)
AST: 23 U/L (ref 15–41)
Albumin: 3.7 g/dL (ref 3.5–5.0)
Alkaline Phosphatase: 111 U/L (ref 38–126)
Anion gap: 13 (ref 5–15)
BUN: 9 mg/dL (ref 6–20)
CO2: 24 mmol/L (ref 22–32)
Calcium: 9.1 mg/dL (ref 8.9–10.3)
Chloride: 105 mmol/L (ref 98–111)
Creatinine, Ser: 0.68 mg/dL (ref 0.44–1.00)
GFR, Estimated: >60 mL/min (ref >60)
Glucose, Bld: 96 mg/dL (ref 70–99)
Potassium: 3.1 mmol/L — ABNORMAL LOW (ref 3.5–5.1)
Sodium: 141 mmol/L (ref 135–145)
Total Bilirubin: 0.3 mg/dL (ref 0.0–1.2)
Total Protein: 7.4 g/dL (ref 6.5–8.1)

## 2024-07-10 LAB — TROPONIN T, HIGH SENSITIVITY: Troponin T High Sensitivity: <15 ng/L (ref <19)

## 2024-07-10 LAB — RESP PANEL BY RT-PCR (RSV, FLU A&B, COVID)  RVPGX2
Influenza A by PCR: NEGATIVE
Influenza B by PCR: NEGATIVE
Resp Syncytial Virus by PCR: NEGATIVE
SARS Coronavirus 2 by RT PCR: POSITIVE — AB

## 2024-07-10 MED ORDER — POTASSIUM CHLORIDE CRYS ER 20 MEQ PO TBCR
40.0000 meq | EXTENDED_RELEASE_TABLET | Freq: Once | ORAL | Status: AC
  Administered 2024-07-10: 40 meq via ORAL
  Filled 2024-07-10: qty 2

## 2024-07-10 NOTE — ED Triage Notes (Signed)
 Pt c/o body aches since this weekend; denies fever; reports she has felt this way with low K+ before

## 2024-07-10 NOTE — Discharge Instructions (Addendum)
Follow-up with your primary care doctor if your symptoms are not improving.  Return to the emergency room if you have any worsening symptoms. 

## 2024-07-10 NOTE — ED Provider Notes (Signed)
 [facility] Provider Note   CSN: [id number] Arrival date & time: 07/10/24  1544     Patient presents with: Generalized Body Aches   Elizabeth Riggs is a 53 y.o. female.   Patient is a 53 year old female with a history of hypothyroidism, hypertension, sarcoidosis who presents with bodyaches.  She said it started 5 to 6 days ago.  She feels flulike.  She has had a little bit of runny nose but that seems to have improved.  She has a little bit of coughing but no shortness of breath.  She does have pain across her chest that is been going on since Friday.  It is not worse with exertion.  She describes it as a tightness.  It is not worse with movement or coughing.  Not worse with deep breathing.  She has some baseline leg swelling but denies any worsening swelling or calf tenderness.  No known fevers.  No vomiting or diarrhea.       Prior to Admission medications   Medication Sig Start Date End Date Taking? Authorizing Provider  albuterol  (VENTOLIN  HFA) 108 (90 Base) MCG/ACT inhaler Inhale 1-2 puffs into the lungs every 6 (six) hours as needed. Patient not taking: Reported on 02/22/2024 12/05/22   Parrett, Madelin RAMAN, NP  Diclofenac  Sodium (PENNSAID ) 2 % SOLN Place 1 application onto the skin 2 (two) times daily. Patient not taking: Reported on 02/22/2024 10/22/21   Chick Venetia BRAVO, MD  ibuprofen  (ADVIL ,MOTRIN ) 400 MG tablet Take 400 mg by mouth every 6 (six) hours as needed for moderate pain.    [provider]  levothyroxine  (SYNTHROID , LEVOTHROID) 125 MCG tablet Take 1 tablet (125 mcg total) by mouth daily. Patient not taking: Reported on 02/22/2024 03/23/17   Parrett, Madelin RAMAN, NP  meloxicam  (MOBIC ) 7.5 MG tablet Take 1 tablet (7.5 mg total) by mouth daily. 03/21/24   Palumbo, April, MD  pantoprazole  (PROTONIX ) 40 MG tablet Take 1 tablet (40 mg total) by mouth daily. 02/22/24   Parrett, Madelin RAMAN, NP  phentermine  (ADIPEX-P ) 37.5 MG tablet  Take 1 tablet (37.5 mg total) by mouth every morning 30 minutes before or 1-2 hours after breakfast Patient not taking: Reported on 02/22/2024 07/26/23     potassium chloride  SA (KLOR-CON  M) 20 MEQ tablet Take 2 tablets (40 mEq total) by mouth daily for 7 days. 10/23/22 10/30/22  Curatolo, Adam, DO  predniSONE  (DELTASONE ) 10 MG tablet 2 tabs for 5 days, then 1 tab for 5 days, then stop Patient not taking: Reported on 02/22/2024 11/18/22   Gladis Leonor HERO, MD  Semaglutide -Weight Management (WEGOVY ) 0.25 MG/0.5ML SOAJ Inject 0.25 mg into the skin once a week. Administer weeks 1 through 4 of therapy Patient not taking: Reported on 02/22/2024 03/07/23     Semaglutide -Weight Management (WEGOVY ) 0.5 MG/0.5ML SOAJ Inject 0.5 mg into the skin once a week. 07/26/23     spironolactone (ALDACTONE) 25 MG tablet Take 25 mg by mouth daily. Patient not taking: Reported on 02/22/2024 11/01/22   [provider]    Allergies: Morphine and codeine and Lisinopril    Review of Systems  Constitutional:  Positive for fatigue. Negative for chills, diaphoresis and fever.  HENT:  Positive for congestion and rhinorrhea. Negative for sneezing.   Eyes: Negative.   Respiratory:  Positive for cough and chest tightness. Negative for shortness of breath.   Cardiovascular:  Negative for leg swelling.  Gastrointestinal:  Negative for abdominal pain, blood in stool, diarrhea,  nausea and vomiting.  Genitourinary:  Negative for difficulty urinating, flank pain, frequency and hematuria.  Musculoskeletal:  Positive for myalgias. Negative for back pain.  Skin:  Negative for rash.  Neurological:  Negative for dizziness, speech difficulty, weakness, numbness and headaches.    Updated Vital Signs BP (!) 183/86 (BP Location: Right Arm)   Pulse 79   Temp 98 F (36.7 C)   Resp 20   Ht 5' 5 (1.651 m)   Wt 113.4 kg   SpO2 96%   BMI 41.60 kg/m   Physical Exam Constitutional:      Appearance: She is well-developed.   HENT:     Head: Normocephalic and atraumatic.  Eyes:     Pupils: Pupils are equal, round, and reactive to light.  Cardiovascular:     Rate and Rhythm: Normal rate and regular rhythm.     Heart sounds: Normal heart sounds.  Pulmonary:     Effort: Pulmonary effort is normal. No respiratory distress.     Breath sounds: Normal breath sounds. No wheezing or rales.  Chest:     Chest wall: No tenderness.  Abdominal:     General: Bowel sounds are normal.     Palpations: Abdomen is soft.     Tenderness: There is no abdominal tenderness. There is no guarding or rebound.  Musculoskeletal:        General: Normal range of motion.     Cervical back: Normal range of motion and neck supple.     Comments: Trace edema to lower extremities bilaterally, no calf tenderness  Lymphadenopathy:     Cervical: No cervical adenopathy.  Skin:    General: Skin is warm and dry.     Findings: No rash.  Neurological:     Mental Status: She is alert and oriented to person, place, and time.     (all labs ordered are listed, but only abnormal results are displayed) Labs Reviewed  RESP PANEL BY RT-PCR (RSV, FLU A&B, COVID)  RVPGX2 - Abnormal; Notable for the following components:      Result Value   SARS Coronavirus 2 by RT PCR POSITIVE (*)    All other components within normal limits  COMPREHENSIVE METABOLIC PANEL WITH GFR - Abnormal; Notable for the following components:   Potassium 3.1 (*)    All other components within normal limits  CBC WITH DIFFERENTIAL/PLATELET - Abnormal; Notable for the following components:   Hemoglobin 11.9 (*)    RDW 16.0 (*)    All other components within normal limits  TROPONIN T, HIGH SENSITIVITY    EKG: EKG Interpretation Date/Time:  Wednesday July 10 2024 17:23:48 EDT Ventricular Rate:  71 PR Interval:  148 QRS Duration:  105 QT Interval:  440 QTC Calculation: 479 R Axis:   -32  Text Interpretation: Sinus rhythm Right atrial enlargement Left axis deviation  Confirmed by Lenor Hollering [phone]) on 07/10/2024 5:25:15 PM  Radiology: [facility] Chest Port 1 View Result Date: 07/10/2024 CLINICAL DATA:  COVID positive, cough and chest pain. EXAM: PORTABLE CHEST 1 VIEW COMPARISON:  09/22/2023 and CT chest 07/31/2019. FINDINGS: Trachea is midline. Heart is enlarged. No airspace consolidation or pleural fluid. IMPRESSION: No acute findings. Electronically Signed   By: Newell Eke M.D.   On: 07/10/2024 17:25     Procedures   Medications Ordered in the ED  potassium chloride  SA (KLOR-CON  M) CR tablet 40 mEq (40 mEq Oral Given 07/10/24 1722)  Medical Decision Making Amount and/or Complexity of Data Reviewed Labs: ordered. Radiology: ordered.  Risk Prescription drug management.   This patient presents to the ED for concern of fatigue, this involves an extensive number of treatment options, and is a complaint that carries with it a high risk of complications and morbidity.  I considered the following differential and admission for this acute, potentially life threatening condition.  The differential diagnosis includes infection, pneumonia, electrolyte abnormality, ACS, dehydration, anemia, pregnancy  MDM:    Patient is a 54 year old female who presents with general fatigue and myalgias.  She also has some chest tightness.  Her EKG does not show any ischemic changes.  Her troponin is negative.  She does not have other symptoms that would be more concerning for ACS.  Her COVID test is positive.  Her other blood work shows a mildly low potassium at 3.1.  She was given a dose of oral potassium.  She has had a history of this in the past.  She is status post hysterectomy so pregnancy test was not done.  She overall is well-appearing.  Chest x-ray does not show any evidence of pneumonia.  She does not have hypoxia or other clinical concerns for PE.  She was discharged home in good condition.  She was given symptomatic care  instructions.  Return precautions were given.  (Labs, imaging, consults)  Labs: I Ordered, and personally interpreted labs.  The pertinent results include: Hypokalemia, positive COVID test  Imaging Studies ordered: I ordered imaging studies including chest x-ray I independently visualized and interpreted imaging. I agree with the radiologist interpretation  Additional history obtained from chart review.  External records from outside source obtained and reviewed including prior labs, notes  Cardiac Monitoring: The patient was maintained on a cardiac monitor.  If on the cardiac monitor, I personally viewed and interpreted the cardiac monitored which showed an underlying rhythm of: Sinus rhythm  Reevaluation: After the interventions noted above, I reevaluated the patient and found that they have :improved  Social Determinants of Health:    Disposition: Discharged to home  Co morbidities that complicate the patient evaluation  Past Medical History:  Diagnosis Date   Chronic pain of both knees    Gallstones    GERD (gastroesophageal reflux disease)    Hypertension    Obesity    Sarcoidosis    Thyroid  disease      Medicines Meds ordered this encounter  Medications   potassium chloride  SA (KLOR-CON  M) CR tablet 40 mEq    I have reviewed the patients home medicines and have made adjustments as needed  Problem List / ED Course: Problem List Items Addressed This Visit       Other   Hypokalemia   Other Visit Diagnoses       COVID-19 virus infection    -  Primary                Final diagnoses:  COVID-19 virus infection  Hypokalemia    ED Discharge Orders     None          Lenor Hollering, MD 07/10/24 1840

## 2024-07-15 ENCOUNTER — Encounter: Payer: Self-pay | Admitting: Adult Health

## 2024-08-22 ENCOUNTER — Ambulatory Visit: Payer: BC Managed Care – PPO | Admitting: Adult Health

## 2024-08-22 ENCOUNTER — Ambulatory Visit: Payer: BC Managed Care – PPO | Admitting: Pulmonary Disease

## 2024-08-22 DIAGNOSIS — D869 Sarcoidosis, unspecified: Secondary | ICD-10-CM

## 2024-08-22 LAB — PULMONARY FUNCTION TEST
DL/VA % pred: 136 %
DL/VA: 5.76 ml/min/mmHg/L
DLCO cor % pred: 90 %
DLCO cor: 20.12 ml/min/mmHg
DLCO unc % pred: 86 %
DLCO unc: 19.13 ml/min/mmHg
FEF 25-75 Pre: 2.44 L/s
FEF2575-%Pred-Pre: 88 %
FEV1-%Pred-Pre: 63 %
FEV1-Pre: 1.87 L
FEV1FVC-%Pred-Pre: 105 %
FEV6-%Pred-Pre: 61 %
FEV6-Pre: 2.24 L
FEV6FVC-%Pred-Pre: 102 %
FVC-%Pred-Pre: 60 %
FVC-Pre: 2.24 L
Pre FEV1/FVC ratio: 84 %
Pre FEV6/FVC Ratio: 100 %

## 2024-08-22 NOTE — Patient Instructions (Signed)
 Spirometry/DLCO performed today.

## 2024-08-22 NOTE — Progress Notes (Signed)
 Spirometry/DLCO performed today.

## 2024-09-11 ENCOUNTER — Ambulatory Visit: Admitting: Primary Care

## 2024-10-04 ENCOUNTER — Ambulatory Visit: Payer: Self-pay | Admitting: Adult Health

## 2025-01-30 ENCOUNTER — Emergency Department (HOSPITAL_BASED_OUTPATIENT_CLINIC_OR_DEPARTMENT_OTHER)
Admission: EM | Admit: 2025-01-30 | Discharge: 2025-01-30 | Disposition: A | Discharge status: Home / Self Care | Attending: Emergency Medicine | Admitting: Emergency Medicine

## 2025-01-30 ENCOUNTER — Other Ambulatory Visit: Payer: Self-pay

## 2025-01-30 ENCOUNTER — Encounter (HOSPITAL_BASED_OUTPATIENT_CLINIC_OR_DEPARTMENT_OTHER): Payer: Self-pay

## 2025-01-30 DIAGNOSIS — I10 Essential (primary) hypertension: Secondary | ICD-10-CM | POA: Insufficient documentation

## 2025-01-30 DIAGNOSIS — E876 Hypokalemia: Secondary | ICD-10-CM | POA: Insufficient documentation

## 2025-01-30 LAB — MAGNESIUM: Magnesium: 1.9 mg/dL (ref 1.7–2.4)

## 2025-01-30 LAB — COMPREHENSIVE METABOLIC PANEL WITH GFR
ALT: 12 U/L (ref 0–44)
AST: 21 U/L (ref 15–41)
Albumin: 4.2 g/dL (ref 3.5–5.0)
Alkaline Phosphatase: 122 U/L (ref 38–126)
Anion gap: 10 (ref 5–15)
BUN: 10 mg/dL (ref 6–20)
CO2: 28 mmol/L (ref 22–32)
Calcium: 9.9 mg/dL (ref 8.9–10.3)
Chloride: 105 mmol/L (ref 98–111)
Creatinine, Ser: 0.84 mg/dL (ref 0.44–1.00)
GFR, Estimated: >60 mL/min
Glucose, Bld: 84 mg/dL (ref 70–99)
Potassium: 3.3 mmol/L — ABNORMAL LOW (ref 3.5–5.1)
Sodium: 143 mmol/L (ref 135–145)
Total Bilirubin: 0.4 mg/dL (ref 0.0–1.2)
Total Protein: 7.9 g/dL (ref 6.5–8.1)

## 2025-01-30 LAB — URINALYSIS, ROUTINE W REFLEX MICROSCOPIC
Bilirubin Urine: NEGATIVE
Glucose, UA: NEGATIVE mg/dL
Hgb urine dipstick: NEGATIVE
Ketones, ur: NEGATIVE mg/dL
Leukocytes,Ua: NEGATIVE
Nitrite: NEGATIVE
Protein, ur: NEGATIVE mg/dL
Specific Gravity, Urine: 1.025 (ref 1.005–1.030)
pH: 6 (ref 5.0–8.0)

## 2025-01-30 LAB — CBC
HCT: 38.9 % (ref 36.0–46.0)
Hemoglobin: 12.9 g/dL (ref 12.0–15.0)
MCH: 28.8 pg (ref 26.0–34.0)
MCHC: 33.2 g/dL (ref 30.0–36.0)
MCV: 86.8 fL (ref 80.0–100.0)
Platelets: 327 10*3/uL (ref 150–400)
RBC: 4.48 MIL/uL (ref 3.87–5.11)
RDW: 15.8 % — ABNORMAL HIGH (ref 11.5–15.5)
WBC: 5.6 10*3/uL (ref 4.0–10.5)
nRBC: 0 % (ref 0.0–0.2)

## 2025-01-30 LAB — LIPASE, BLOOD: Lipase: 35 U/L (ref 11–51)

## 2025-01-30 MED ORDER — ALUM & MAG HYDROXIDE-SIMETH 200-200-20 MG/5ML PO SUSP
15.0000 mL | Freq: Once | ORAL | Status: AC
  Administered 2025-01-30: 15 mL via ORAL
  Filled 2025-01-30: qty 30

## 2025-01-30 MED ORDER — POTASSIUM CHLORIDE CRYS ER 20 MEQ PO TBCR
40.0000 meq | EXTENDED_RELEASE_TABLET | Freq: Once | ORAL | Status: AC
  Administered 2025-01-30: 40 meq via ORAL
  Filled 2025-01-30: qty 2

## 2025-01-30 NOTE — ED Triage Notes (Signed)
 Pt states she is coming in for body aches, states she at some [facility] Sunday and that's what it is streaming from, also mentions she had her blood drawn sometime this past week and they said her potassium was low. Pt is otherwise stable at this time in triage.

## 2025-01-30 NOTE — ED Provider Notes (Signed)
 " [place] [facility] Provider Note   CSN: [id number] Arrival date & time: 01/30/25  8171     Patient presents with: Generalized Body Aches   Elizabeth Riggs is a 54 y.o. female with past medical history of hyperglycemia, hypertension, GERD, venous insufficiency, who presents to the emergency department for evaluation of generalized weakness and lower extremity aches.  Patient reports that on Monday, she went to the grocery store and was feeling dizzy.  She denies any LOC.  Shortly after that on Monday, patient states she went to [employer].  On Monday afternoon, she reported vomiting and diarrhea for the entire day.  Patient continues to report diarrhea through today.  She is unsure if vomiting bowel movements she has had.  She denies any hematochezia or melena.  Patient is also reporting bilateral lower extremity aches.  She states that [facility], and saw her lab results which showed a potassium that was 3.3.  Patient states that her provider has not yet called her about these results, but she did take an oral potassium supplement at home last night.  She is requesting to have her levels rechecked today.  HPI     Prior to Admission medications  Medication Sig Start Date End Date Taking? Authorizing Provider  albuterol  (VENTOLIN  HFA) 108 (90 Base) MCG/ACT inhaler Inhale 1-2 puffs into the lungs every 6 (six) hours as needed. Patient not taking: Reported on 02/22/2024 12/05/22   Parrett, Madelin RAMAN, NP  Diclofenac  Sodium (PENNSAID ) 2 % SOLN Place 1 application onto the skin 2 (two) times daily. Patient not taking: Reported on 02/22/2024 10/22/21   Chick Venetia BRAVO, MD  ibuprofen  (ADVIL ,MOTRIN ) 400 MG tablet Take 400 mg by mouth every 6 (six) hours as needed for moderate pain.    [provider]  levothyroxine  (SYNTHROID , LEVOTHROID) 125 MCG tablet Take 1 tablet (125 mcg total) by mouth daily. Patient not taking: Reported on  02/22/2024 03/23/17   Parrett, Madelin RAMAN, NP  meloxicam  (MOBIC ) 7.5 MG tablet Take 1 tablet (7.5 mg total) by mouth daily. 03/21/24   Palumbo, April, MD  OZEMPIC , 1 MG/DOSE, 4 MG/3ML SOPN Inject 1 mg into the skin once a week. 08/15/24   [provider]  pantoprazole  (PROTONIX ) 40 MG tablet Take 1 tablet (40 mg total) by mouth daily. 02/22/24   Parrett, Madelin RAMAN, NP  potassium chloride  SA (KLOR-CON  M) 20 MEQ tablet Take 2 tablets (40 mEq total) by mouth daily for 7 days. 10/23/22 10/30/22  Curatolo, Adam, DO  spironolactone (ALDACTONE) 25 MG tablet Take 25 mg by mouth daily. Patient not taking: Reported on 02/22/2024 11/01/22   [provider]    Allergies: Morphine and codeine, Rosuvastatin, and Lisinopril    Review of Systems  Gastrointestinal:  Positive for diarrhea.    Updated Vital Signs BP (!) 143/92 (BP Location: Right Arm)   Pulse 78   Temp 98.3 F (36.8 C) (Oral)   Resp 16   SpO2 98%   Physical Exam Vitals and nursing note reviewed.  Constitutional:      Appearance: Normal appearance.  HENT:     Head: Normocephalic and atraumatic.     Mouth/Throat:     Mouth: Mucous membranes are moist.  Eyes:     General: No scleral icterus.       Right eye: No discharge.        Left eye: No discharge.     Conjunctiva/sclera: Conjunctivae normal.  Cardiovascular:  Rate and Rhythm: Normal rate and regular rhythm.     Pulses: Normal pulses.  Pulmonary:     Effort: Pulmonary effort is normal.     Breath sounds: Normal breath sounds.  Abdominal:     General: There is no distension.     Tenderness: There is abdominal tenderness.     Comments: Left upper quadrant tenderness to palpation  Musculoskeletal:        General: No deformity.     Cervical back: Normal range of motion.  Skin:    General: Skin is warm and dry.     Capillary Refill: Capillary refill takes less than 2 seconds.  Neurological:     Mental Status: She is alert.     Motor: No weakness.  Psychiatric:         Mood and Affect: Mood normal.     (all labs ordered are listed, but only abnormal results are displayed) Labs Reviewed  COMPREHENSIVE METABOLIC PANEL WITH GFR - Abnormal; Notable for the following components:      Result Value   Potassium 3.3 (*)    All other components within normal limits  CBC - Abnormal; Notable for the following components:   RDW 15.8 (*)    All other components within normal limits  URINALYSIS, ROUTINE W REFLEX MICROSCOPIC  LIPASE, BLOOD  MAGNESIUM     EKG: None  Radiology: No results found.  Procedures   Medications Ordered in the ED  potassium chloride  SA (KLOR-CON  M) CR tablet 40 mEq (40 mEq Oral Given 01/30/25 2025)  alum & mag hydroxide-simeth (MAALOX/MYLANTA) 200-200-20 MG/5ML suspension 15 mL (15 mLs Oral Given 01/30/25 2025)                                   Medical Decision Making Amount and/or Complexity of Data Reviewed Labs: ordered.  Risk OTC drugs. Prescription drug management.   This patient presents to the ED for concern of generalized weakness and leg cramps, this involves an extensive number of treatment options, and is a complaint that carries with it a high risk of complications and morbidity.  Differential diagnosis includes: Electrolyte abnormality, viral illness, Cellulitis, medication induced, peripheral neuropathy, radiculopathy, inflammatory myopathy, GBS, compression, rhabdo  Co morbidities:  see above   Additional history:  Patient obtained report from her PCP yesterday and was found to have mild hypokalemia of 3.3 and mild hypomagnesemia of 1.6.  Lab Tests:  I Ordered, and personally interpreted labs.  The pertinent results include:    - Hypokalemia: 3.3  Imaging Studies:  Not indicated  Cardiac Monitoring/ECG:  The patient was maintained on a cardiac monitor.  I personally viewed and interpreted the cardiac monitored which showed an underlying rhythm of: Sinus rhythm  Medicines ordered and  prescription drug management:  I ordered medication including  Medications  potassium chloride  SA (KLOR-CON  M) CR tablet 40 mEq (40 mEq Oral Given 01/30/25 2025)  alum & mag hydroxide-simeth (MAALOX/MYLANTA) 200-200-20 MG/5ML suspension 15 mL (15 mLs Oral Given 01/30/25 2025)   for hypokalemia and abdominal cramping Reevaluation of the patient after these medicines showed that the patient improved I have reviewed the patients home medicines and have made adjustments as needed  Test Considered:   none  Critical Interventions:   none  Consultations Obtained: None  Problem List / ED Course:     ICD-10-CM   1. Hypokalemia  E87.6       MDM:  54 year old female who presents emergency department for evaluation of generalized weakness and lower extremity aches since Monday.  Her BMP does show a mild hypokalemia of 3.3.  Magnesium  is 1.9.  I did offer to replete these electrolytes for the patient, and she opted to take oral potassium.  Additionally, I offered a bolus of LR, but she declined.  Given that her electrolytes are grossly within normal limits, I feel that this is appropriate.  Upon reassessment, patient was endorsing abdominal cramping.  I did give her simethicone  for her symptoms and recommended she take Imodium  if she continues to experience diarrhea.  Patient states that she has Imodium  at home and does not currently want any in the emergency department.  I do believe her symptoms are likely secondary to the recent GI illness she had and will improve as she begins tolerating a normal diet.  I did provide her with recommendations for how to reintroduce food groups.  Given her grossly reassuring workup, I do not believe CT imaging or any other workup is indicated at this time.  Patient vitals are stable.  Patient is appropriate for discharge at this time.   Dispostion:  After consideration of the diagnostic results and the patients response to treatment, I feel that the patient would  benefit from supportive care.    Final diagnoses:  Hypokalemia    ED Discharge Orders     None          Torrence Marry GORMAN DEVONNA 01/30/25 2051    Rogelia Jerilynn GORMAN, MD 01/30/25 2236  "

## 2025-01-30 NOTE — Discharge Instructions (Addendum)
 Is a pleasure taking care of you today.  Your workup was reassuring.  You are seen in the emergency department for evaluation of generalized fatigue and leg aches/cramps.  You did have mildly decreased potassium, which was repleted here in the emergency department.  I recommend a lab recheck in 1 to 2 weeks when you are no longer having multiple episodes of diarrhea per day.  As we discussed, you may take Imodium  for your symptoms of diarrhea.  Additionally, I recommend following the BRAT diet to reintroduce foods.  Your workup was otherwise unremarkable.  Please return to the emergency department for any worsening symptoms or any other life-threatening illness.
